# Patient Record
Sex: Female | Born: 1950
Health system: Southern US, Community
[De-identification: ages and names within clinical notes are randomized; demographics above are authoritative.]

## PROBLEM LIST (undated history)

## (undated) DIAGNOSIS — M8580 Other specified disorders of bone density and structure, unspecified site: Secondary | ICD-10-CM

## (undated) DIAGNOSIS — C801 Malignant (primary) neoplasm, unspecified: Secondary | ICD-10-CM

## (undated) DIAGNOSIS — J302 Other seasonal allergic rhinitis: Secondary | ICD-10-CM

## (undated) DIAGNOSIS — Z9889 Other specified postprocedural states: Secondary | ICD-10-CM

## (undated) DIAGNOSIS — E039 Hypothyroidism, unspecified: Secondary | ICD-10-CM

## (undated) DIAGNOSIS — B019 Varicella without complication: Secondary | ICD-10-CM

## (undated) HISTORY — DX: Hypothyroidism, unspecified: E03.9

## (undated) HISTORY — DX: Other specified disorders of bone density and structure, unspecified site: M85.80

## (undated) HISTORY — DX: Other seasonal allergic rhinitis: J30.2

## (undated) HISTORY — DX: Other specified postprocedural states: Z98.890

## (undated) HISTORY — PX: BREAST BIOPSY: SHX20

## (undated) HISTORY — DX: Varicella without complication: B01.9

## (undated) HISTORY — DX: Malignant (primary) neoplasm, unspecified: C80.1

---

## 1994-09-27 DIAGNOSIS — C801 Malignant (primary) neoplasm, unspecified: Secondary | ICD-10-CM

## 1994-09-27 HISTORY — DX: Malignant (primary) neoplasm, unspecified: C80.1

## 1996-09-27 HISTORY — PX: OTHER SURGICAL HISTORY: SHX169

## 2015-11-14 ENCOUNTER — Encounter: Payer: Self-pay | Admitting: Adult Health

## 2015-11-14 ENCOUNTER — Ambulatory Visit (INDEPENDENT_AMBULATORY_CARE_PROVIDER_SITE_OTHER): Payer: 59 | Admitting: Adult Health

## 2015-11-14 VITALS — BP 124/72 | HR 95 | Temp 97.2°F | Ht 66.0 in | Wt 136.6 lb

## 2015-11-14 DIAGNOSIS — Z7189 Other specified counseling: Secondary | ICD-10-CM | POA: Diagnosis not present

## 2015-11-14 DIAGNOSIS — E039 Hypothyroidism, unspecified: Secondary | ICD-10-CM | POA: Diagnosis not present

## 2015-11-14 DIAGNOSIS — Z7689 Persons encountering health services in other specified circumstances: Secondary | ICD-10-CM

## 2015-11-14 MED ORDER — LEVOTHYROXINE SODIUM 50 MCG PO TABS: 50.0000 ug | ORAL_TABLET | Freq: Every day | ORAL | Status: DC

## 2015-11-14 NOTE — Patient Instructions (Signed)
It was great meeting you today.  Please follow up with you for your physical.   If you need anything in the meantime, please let me know.

## 2015-11-14 NOTE — Progress Notes (Signed)
Patient presents to clinic today to establish care. She is a very pleasant caucasian female who moved from [place] about one year ago. She  has a past medical history of Cancer ([place]) (1996); Hypothyroidism; Seasonal allergies; Chicken pox; and H/O repair of rotator cuff.   Acute Concerns:  Establish Care  Chronic Issues:  Hypothyroidism  - She has been taking 33mcg of Synthroid for " many years". Denies any complications from the medication.   Health Maintenance: Dental --Goes yearly  Vision -- Goes yearly  Immunizations -- UTD  Colonoscopy -- Dec 2014 - 10 year plan  Mammogram -- 02/2015  Bone Density -- Unknown when the last Exercise: Walks Diet: Does not follow a certain diet.   Past Medical History  Diagnosis Date  . Cancer ([place]) 1996    Melanoma    Past Surgical History  Procedure Laterality Date  . Breast biopsy Right 1984, 1996    Benign    No current outpatient prescriptions on file prior to visit.   No current facility-administered medications on file prior to visit.    No Known Allergies  Family History  Problem Relation Age of Onset  . Cancer Mother   . Heart disease Father   . Heart disease Maternal Grandmother   . Stroke Paternal Grandfather     Social History   Social History  . Marital Status: Married    Spouse Name: N/A  . Number of Children: N/A  . Years of Education: N/A   Occupational History  . Not on file.   Social History Main Topics  . Smoking status: Never Smoker   . Smokeless tobacco: Not on file  . Alcohol Use: 0.0 oz/week    0 Standard drinks or equivalent per week  . Drug Use: Yes  . Sexual Activity: Not on file   Other Topics Concern  . Not on file   Social History Narrative  . No narrative on file    Review of Systems  Constitutional: Negative.   HENT: Negative.   Eyes: Negative.   Respiratory: Negative.   Cardiovascular: Negative.   Gastrointestinal: Negative.   Genitourinary: Negative.     Musculoskeletal: Negative.   Skin: Negative.   Neurological: Negative.   Endo/Heme/Allergies: Negative.   Psychiatric/Behavioral: Negative.   All other systems reviewed and are negative.   BP 124/72 mmHg  Pulse 95  Temp(Src) 97.2 F (36.2 C) (Oral)  Ht 5\' 6"  (1.676 m)  Wt 136 lb 9.6 oz (61.961 kg)  BMI 22.06 kg/m2  Physical Exam  Constitutional: She is oriented to person, place, and time and well-developed, well-nourished, and in no distress. No distress.  HENT:  Head: Normocephalic and atraumatic.  Right Ear: External ear normal.  Left Ear: External ear normal.  Nose: Nose normal.  Mouth/Throat: Oropharynx is clear and moist. No oropharyngeal exudate.  Eyes: Conjunctivae and EOM are normal. Pupils are equal, round, and reactive to light. Right eye exhibits no discharge. Left eye exhibits no discharge. No scleral icterus.  Neck: Normal range of motion. Neck supple. No thyromegaly present.  Cardiovascular: Normal rate, regular rhythm, normal heart sounds and intact distal pulses.  Exam reveals no gallop and no friction rub.   No murmur heard. Pulmonary/Chest: Effort normal and breath sounds normal. No respiratory distress. She has no wheezes. She has no rales. She exhibits no tenderness.  Lymphadenopathy:    She has no cervical adenopathy.  Neurological: She is alert and oriented to person, place, and time. She exhibits normal muscle  tone. Gait normal. Coordination normal. GCS score is 15.  Skin: Skin is warm and dry. No rash noted. She is not diaphoretic. No erythema. No pallor.  Psychiatric: Mood, memory, affect and judgment normal.  Nursing note and vitals reviewed.  Assessment/Plan: 1. Encounter to establish care - follow up for CPE - Follow up sooner if needed - Stressed the importance of diet and exercise.   2. Hypothyroidism, unspecified hypothyroidism type - levothyroxine (SYNTHROID) 50 MCG tablet; Take 1 tablet (50 mcg total) by mouth daily before breakfast.   Dispense: 90 tablet; Refill: 3

## 2015-11-18 MED FILL — LEVOTHYROXINE 50 MCG TABLET: 50 | 90 days supply | Qty: 90 | Fill #0

## 2015-11-20 ENCOUNTER — Encounter: Payer: Self-pay | Admitting: Adult Health

## 2016-02-09 ENCOUNTER — Encounter: Payer: 59 | Admitting: Adult Health

## 2016-02-11 ENCOUNTER — Encounter: Payer: 59 | Admitting: Adult Health

## 2016-02-12 ENCOUNTER — Encounter: Payer: Self-pay | Admitting: Adult Health

## 2016-02-12 ENCOUNTER — Ambulatory Visit (INDEPENDENT_AMBULATORY_CARE_PROVIDER_SITE_OTHER): Payer: 59 | Admitting: Adult Health

## 2016-02-12 VITALS — BP 118/70 | Temp 98.1°F | Ht 66.0 in | Wt 132.5 lb

## 2016-02-12 DIAGNOSIS — Z780 Asymptomatic menopausal state: Secondary | ICD-10-CM | POA: Diagnosis not present

## 2016-02-12 DIAGNOSIS — E039 Hypothyroidism, unspecified: Secondary | ICD-10-CM | POA: Diagnosis not present

## 2016-02-12 DIAGNOSIS — Z0000 Encounter for general adult medical examination without abnormal findings: Secondary | ICD-10-CM

## 2016-02-12 LAB — LIPID PANEL
CHOLESTEROL: 178 mg/dL (ref 0–200)
HDL: 81.3 mg/dL (ref >39.00)
LDL CALC: 82 mg/dL (ref 0–99)
NonHDL: 96.52
Total CHOL/HDL Ratio: 2
Triglycerides: 71 mg/dL (ref 0.0–149.0)
VLDL: 14.2 mg/dL (ref 0.0–40.0)

## 2016-02-12 LAB — CBC
HEMATOCRIT: 38.2 % (ref 36.0–46.0)
Hemoglobin: 12.9 g/dL (ref 12.0–15.0)
MCHC: 33.7 g/dL (ref 30.0–36.0)
MCV: 92.7 fl (ref 78.0–100.0)
Platelets: 407 10*3/uL — ABNORMAL HIGH (ref 150.0–400.0)
RBC: 4.12 Mil/uL (ref 3.87–5.11)
RDW: 12.9 % (ref 11.5–15.5)
WBC: 4.5 10*3/uL (ref 4.0–10.5)

## 2016-02-12 LAB — BASIC METABOLIC PANEL
BUN: 11 mg/dL (ref 6–23)
CALCIUM: 9.5 mg/dL (ref 8.4–10.5)
CHLORIDE: 104 meq/L (ref 96–112)
CO2: 30 mEq/L (ref 19–32)
CREATININE: 0.97 mg/dL (ref 0.40–1.20)
GFR: 61.56 mL/min (ref >60.00)
Glucose, Bld: 88 mg/dL (ref 70–99)
Potassium: 4.4 mEq/L (ref 3.5–5.1)
Sodium: 140 mEq/L (ref 135–145)

## 2016-02-12 LAB — HEPATIC FUNCTION PANEL
ALBUMIN: 4.1 g/dL (ref 3.5–5.2)
ALK PHOS: 42 U/L (ref 39–117)
ALT: 14 U/L (ref 0–35)
AST: 20 U/L (ref 0–37)
BILIRUBIN TOTAL: 0.6 mg/dL (ref 0.2–1.2)
Bilirubin, Direct: 0.1 mg/dL (ref 0.0–0.3)
Total Protein: 6 g/dL (ref 6.0–8.3)

## 2016-02-12 LAB — POC URINALSYSI DIPSTICK (AUTOMATED)
Blood, UA: NEGATIVE
Glucose, UA: NEGATIVE
Leukocytes, UA: NEGATIVE
Nitrite, UA: NEGATIVE
PH UA: 6
SPEC GRAV UA: 1.025
Urobilinogen, UA: 0.2

## 2016-02-12 LAB — TSH: TSH: 1.5 u[IU]/mL (ref 0.35–4.50)

## 2016-02-12 LAB — HEMOGLOBIN A1C: HEMOGLOBIN A1C: 5.7 % (ref 4.6–6.5)

## 2016-02-12 NOTE — Addendum Note (Signed)
Addended by: Sandria Bales B on: 02/12/2016 11:38 AM   Modules accepted: Orders

## 2016-02-12 NOTE — Progress Notes (Signed)
Subjective:    Patient ID: Natasha Norman, female    DOB: [birth year], 65 y.o.   MRN: [id number]  HPI  Patient presents for yearly preventative medicine examination.  All immunizations and health maintenance protocols were reviewed with the patient and needed orders were placed  Medication reconciliation,  past medical history, social history, problem list and allergies were reviewed in detail with the patient  Goals were established with regard to weight loss, exercise, and  diet in compliance with medications  End of life planning was discussed - she does not have a living will or advanced directive  She is up to date on health maintenance such as eye exam, dental visit, pelvic, and colonoscopy. She will have a mammogram and bone density in June.   Review of Systems  Constitutional: Negative.   HENT: Negative.   Eyes: Negative.   Respiratory: Negative.   Cardiovascular: Negative.   Gastrointestinal: Negative.   Endocrine: Negative.   Genitourinary: Negative.   Musculoskeletal: Negative.   Allergic/Immunologic: Negative.   Neurological: Negative.   Hematological: Negative.   Psychiatric/Behavioral: Negative.    Past Medical History  Diagnosis Date  . Cancer ([place]) 1996    Melanoma  . Hypothyroidism   . Seasonal allergies   . Chicken pox   . H/O repair of rotator cuff     Right   . Osteopenia     Social History   Social History  . Marital Status: Married    Spouse Name: N/A  . Number of Children: N/A  . Years of Education: N/A   Occupational History  . Not on file.   Social History Main Topics  . Smoking status: Never Smoker   . Smokeless tobacco: Not on file  . Alcohol Use: 0.0 oz/week    0 Standard drinks or equivalent per week     Comment: Wine  . Drug Use: No  . Sexual Activity: Not on file   Other Topics Concern  . Not on file   Social History Narrative   Web designer    Married   Moved from [place]     Past Surgical  History  Procedure Laterality Date  . Breast biopsy Right 1984, 1996    Benign  . Lymph node resection  1998    Family History  Problem Relation Age of Onset  . Cancer Mother     breast cancer  . Heart disease Father   . Heart failure Maternal Grandmother   . Stroke Paternal Grandfather     No Known Allergies  Current Outpatient Prescriptions on File Prior to Visit  Medication Sig Dispense Refill  . levothyroxine (SYNTHROID) 50 MCG tablet Take 1 tablet (50 mcg total) by mouth daily before breakfast. 90 tablet 3  . triamcinolone cream (KENALOG) 0.1 % Apply 1 application topically as needed.     No current facility-administered medications on file prior to visit.    BP 118/70 mmHg  Temp(Src) 98.1 F (36.7 C) (Oral)  Ht 5\' 6"  (1.676 m)  Wt 132 lb 8 oz (60.102 kg)  BMI 21.40 kg/m2       Objective:   Physical Exam  Constitutional: She is oriented to person, place, and time. She appears well-developed and well-nourished. No distress.  HENT:  Head: Normocephalic and atraumatic.  Right Ear: External ear normal.  Left Ear: External ear normal.  Nose: Nose normal.  Mouth/Throat: Oropharynx is clear and moist. No oropharyngeal exudate.  Eyes: Conjunctivae and EOM are normal. Pupils are equal, round,  and reactive to light. Right eye exhibits no discharge. Left eye exhibits no discharge. No scleral icterus.  Neck: Normal range of motion. Neck supple. No JVD present. No tracheal deviation present. No thyromegaly present.  Cardiovascular: Normal rate, regular rhythm, normal heart sounds and intact distal pulses.  Exam reveals no gallop and no friction rub.   No murmur heard. Pulmonary/Chest: Effort normal and breath sounds normal. No stridor. No respiratory distress. She has no wheezes. She has no rales. She exhibits no tenderness.  Abdominal: Soft. Bowel sounds are normal. She exhibits no distension and no mass. There is no tenderness. There is no rebound and no guarding.    Genitourinary:  Breast exam: No lumps, masses, discharge, dimpling, asymmetry.   Musculoskeletal: Normal range of motion.  Lymphadenopathy:    She has no cervical adenopathy.  Neurological: She is alert and oriented to person, place, and time. She has normal reflexes. She displays normal reflexes. No cranial nerve deficit. Coordination normal.  Skin: Skin is warm and dry. No rash noted. No erythema. No pallor.  Psychiatric: She has a normal mood and affect. Her behavior is normal. Judgment and thought content normal.  Nursing note and vitals reviewed.      Assessment & Plan:  1. Routine general medical examination at a health care facility  - POCT Urinalysis Dipstick (Automated) - Basic metabolic panel - CBC - Hemoglobin A1c - Hepatic function panel - Lipid panel - TSH - MM DIGITAL SCREENING BILATERAL; Future - DG Bone Density; Future - Continue to exercise, eat healthy and stay active - Advanced directive and living will given   2. Hypothyroidism, unspecified hypothyroidism type - TSH - Consider changing synthroid dose 3. Post-menopausal  - MM DIGITAL SCREENING BILATERAL; Future - DG Bone Density; Future  Dorothyann Peng, NP

## 2016-02-12 NOTE — Patient Instructions (Signed)
It was great seeing you again today!  I will follow up with you regarding your labs.   Continue to stay active   Schedule your bone density and mammogram   Follow up with me in one year

## 2016-02-18 MED FILL — LEVOTHYROXINE 50 MCG TABLET: 50 | 90 days supply | Qty: 90 | Fill #1

## 2016-04-15 DIAGNOSIS — L814 Other melanin hyperpigmentation: Secondary | ICD-10-CM | POA: Diagnosis not present

## 2016-04-15 DIAGNOSIS — L821 Other seborrheic keratosis: Secondary | ICD-10-CM | POA: Diagnosis not present

## 2016-04-15 DIAGNOSIS — Z85820 Personal history of malignant melanoma of skin: Secondary | ICD-10-CM | POA: Diagnosis not present

## 2016-04-15 DIAGNOSIS — D1801 Hemangioma of skin and subcutaneous tissue: Secondary | ICD-10-CM | POA: Diagnosis not present

## 2016-04-16 ENCOUNTER — Other Ambulatory Visit: Payer: Self-pay | Admitting: Adult Health

## 2016-04-16 DIAGNOSIS — E2839 Other primary ovarian failure: Secondary | ICD-10-CM

## 2016-04-16 DIAGNOSIS — Z1231 Encounter for screening mammogram for malignant neoplasm of breast: Secondary | ICD-10-CM

## 2016-04-23 ENCOUNTER — Ambulatory Visit
Admission: RE | Admit: 2016-04-23 | Discharge: 2016-04-23 | Disposition: A | Discharge status: Home / Self Care | Payer: 59 | Source: Ambulatory Visit | Attending: Adult Health | Admitting: Adult Health

## 2016-04-23 DIAGNOSIS — Z780 Asymptomatic menopausal state: Secondary | ICD-10-CM | POA: Diagnosis not present

## 2016-04-23 DIAGNOSIS — E2839 Other primary ovarian failure: Secondary | ICD-10-CM

## 2016-04-23 DIAGNOSIS — M8589 Other specified disorders of bone density and structure, multiple sites: Secondary | ICD-10-CM | POA: Diagnosis not present

## 2016-04-23 DIAGNOSIS — Z1231 Encounter for screening mammogram for malignant neoplasm of breast: Secondary | ICD-10-CM

## 2016-05-19 MED FILL — LEVOTHYROXINE 50 MCG TABLET: 50 | 90 days supply | Qty: 90 | Fill #2

## 2016-08-04 ENCOUNTER — Other Ambulatory Visit: Payer: Self-pay

## 2016-08-04 ENCOUNTER — Telehealth: Payer: Self-pay | Admitting: Adult Health

## 2016-08-04 MED ORDER — TRIAMCINOLONE ACETONIDE 0.1 % EX CREA: 1.0000 "application " | TOPICAL_CREAM | CUTANEOUS | 1 refills | Status: DC | PRN

## 2016-08-04 MED FILL — LEVOTHYROXINE 50 MCG TABLET: 50 | 90 days supply | Qty: 90 | Fill #3

## 2016-08-04 MED FILL — TRIAMCINOLONE 0.1% CREAM: 0.1 | 15 days supply | Qty: 30 | Fill #0

## 2016-08-04 NOTE — Telephone Encounter (Signed)
Pt needs new rx triamcinolone 0.1 % cream send to [facility]

## 2016-08-04 NOTE — Telephone Encounter (Signed)
OK to refill

## 2016-08-04 NOTE — Telephone Encounter (Signed)
Ok to refill 

## 2016-08-04 NOTE — Telephone Encounter (Signed)
Rx has been sent in. 

## 2016-08-24 DIAGNOSIS — H5213 Myopia, bilateral: Secondary | ICD-10-CM | POA: Diagnosis not present

## 2016-11-17 ENCOUNTER — Other Ambulatory Visit: Payer: Self-pay | Admitting: Adult Health

## 2016-11-17 DIAGNOSIS — E039 Hypothyroidism, unspecified: Secondary | ICD-10-CM

## 2016-11-17 MED FILL — LEVOTHYROXINE 50 MCG TABLET: 50 | 90 days supply | Qty: 90 | Fill #0

## 2016-11-17 NOTE — Telephone Encounter (Signed)
Ok to refill for one year  

## 2016-11-17 NOTE — Telephone Encounter (Signed)
Pt has cpe scheduled 02/09/17 - ok to refill?

## 2017-02-02 ENCOUNTER — Other Ambulatory Visit (HOSPITAL_COMMUNITY)
Admission: RE | Admit: 2017-02-02 | Discharge: 2017-02-02 | Disposition: A | Discharge status: Home / Self Care | Payer: 59 | Source: Ambulatory Visit | Attending: Adult Health | Admitting: Adult Health

## 2017-02-02 ENCOUNTER — Ambulatory Visit (INDEPENDENT_AMBULATORY_CARE_PROVIDER_SITE_OTHER): Payer: 59 | Admitting: Adult Health

## 2017-02-02 ENCOUNTER — Encounter: Payer: Self-pay | Admitting: Adult Health

## 2017-02-02 VITALS — BP 118/62 | Temp 97.9°F | Ht 66.0 in | Wt 133.7 lb

## 2017-02-02 DIAGNOSIS — Z85820 Personal history of malignant melanoma of skin: Secondary | ICD-10-CM | POA: Insufficient documentation

## 2017-02-02 DIAGNOSIS — E039 Hypothyroidism, unspecified: Secondary | ICD-10-CM

## 2017-02-02 DIAGNOSIS — Z0000 Encounter for general adult medical examination without abnormal findings: Secondary | ICD-10-CM

## 2017-02-02 DIAGNOSIS — E559 Vitamin D deficiency, unspecified: Secondary | ICD-10-CM | POA: Insufficient documentation

## 2017-02-02 DIAGNOSIS — Z1159 Encounter for screening for other viral diseases: Secondary | ICD-10-CM | POA: Diagnosis not present

## 2017-02-02 DIAGNOSIS — M8580 Other specified disorders of bone density and structure, unspecified site: Secondary | ICD-10-CM | POA: Diagnosis not present

## 2017-02-02 DIAGNOSIS — J302 Other seasonal allergic rhinitis: Secondary | ICD-10-CM | POA: Insufficient documentation

## 2017-02-02 LAB — HEPATIC FUNCTION PANEL
ALT: 16 U/L (ref 0–35)
AST: 23 U/L (ref 0–37)
Albumin: 4.7 g/dL (ref 3.5–5.2)
Alkaline Phosphatase: 51 U/L (ref 39–117)
BILIRUBIN DIRECT: 0.1 mg/dL (ref 0.0–0.3)
BILIRUBIN TOTAL: 0.6 mg/dL (ref 0.2–1.2)
TOTAL PROTEIN: 7 g/dL (ref 6.0–8.3)

## 2017-02-02 LAB — CBC WITH DIFFERENTIAL/PLATELET
BASOS PCT: 1.3 % (ref 0.0–3.0)
Basophils Absolute: 0.1 10*3/uL (ref 0.0–0.1)
EOS ABS: 0.2 10*3/uL (ref 0.0–0.7)
EOS PCT: 3.5 % (ref 0.0–5.0)
HEMATOCRIT: 41.4 % (ref 36.0–46.0)
HEMOGLOBIN: 13.9 g/dL (ref 12.0–15.0)
Lymphocytes Relative: 33.6 % (ref 12.0–46.0)
Lymphs Abs: 1.8 10*3/uL (ref 0.7–4.0)
MCHC: 33.7 g/dL (ref 30.0–36.0)
MCV: 93.4 fl (ref 78.0–100.0)
MONO ABS: 0.5 10*3/uL (ref 0.1–1.0)
Monocytes Relative: 9.6 % (ref 3.0–12.0)
NEUTROS ABS: 2.7 10*3/uL (ref 1.4–7.7)
Neutrophils Relative %: 52 % (ref 43.0–77.0)
PLATELETS: 422 10*3/uL — AB (ref 150.0–400.0)
RBC: 4.43 Mil/uL (ref 3.87–5.11)
RDW: 13.2 % (ref 11.5–15.5)
WBC: 5.3 10*3/uL (ref 4.0–10.5)

## 2017-02-02 LAB — LIPID PANEL
CHOLESTEROL: 202 mg/dL — AB (ref 0–200)
HDL: 103.3 mg/dL (ref >39.00)
LDL Cholesterol: 83 mg/dL (ref 0–99)
NonHDL: 98.38
TRIGLYCERIDES: 78 mg/dL (ref 0.0–149.0)
Total CHOL/HDL Ratio: 2
VLDL: 15.6 mg/dL (ref 0.0–40.0)

## 2017-02-02 LAB — VITAMIN D 25 HYDROXY (VIT D DEFICIENCY, FRACTURES): VITD: 62.54 ng/mL (ref 30.00–100.00)

## 2017-02-02 LAB — BASIC METABOLIC PANEL
BUN: 15 mg/dL (ref 6–23)
CHLORIDE: 103 meq/L (ref 96–112)
CO2: 28 meq/L (ref 19–32)
CREATININE: 1.03 mg/dL (ref 0.40–1.20)
Calcium: 9.7 mg/dL (ref 8.4–10.5)
GFR: 57.26 mL/min — ABNORMAL LOW (ref >60.00)
Glucose, Bld: 92 mg/dL (ref 70–99)
POTASSIUM: 4.1 meq/L (ref 3.5–5.1)
Sodium: 139 mEq/L (ref 135–145)

## 2017-02-02 LAB — TSH: TSH: 1.59 u[IU]/mL (ref 0.35–4.50)

## 2017-02-02 MED ORDER — LEVOTHYROXINE SODIUM 50 MCG PO TABS: 50.0000 ug | ORAL_TABLET | Freq: Every day | ORAL | 3 refills | Status: DC

## 2017-02-02 MED FILL — LEVOTHYROXINE 50 MCG TABLET: 50 | 90 days supply | Qty: 90 | Fill #0

## 2017-02-02 NOTE — Patient Instructions (Addendum)
It was great seeing you today   I will release your labs to mychart once they are back   Please follow up with me in one year or sooner if needed  Health Maintenance, Female Adopting a healthy lifestyle and getting preventive care can go a long way to promote health and wellness. Talk with your health care provider about what schedule of regular examinations is right for you. This is a good chance for you to check in with your provider about disease prevention and staying healthy. In between checkups, there are plenty of things you can do on your own. Experts have done a lot of research about which lifestyle changes and preventive measures are most likely to keep you healthy. Ask your health care provider for more information. Weight and diet Eat a healthy diet  Be sure to include plenty of vegetables, fruits, low-fat dairy products, and lean protein.  Do not eat a lot of foods high in solid fats, added sugars, or salt.  Get regular exercise. This is one of the most important things you can do for your health.  Most adults should exercise for at least 150 minutes each week. The exercise should increase your heart rate and make you sweat (moderate-intensity exercise).  Most adults should also do strengthening exercises at least twice a week. This is in addition to the moderate-intensity exercise. Maintain a healthy weight  Body mass index (BMI) is a measurement that can be used to identify possible weight problems. It estimates body fat based on height and weight. Your health care provider can help determine your BMI and help you achieve or maintain a healthy weight.  For females 30 years of age and older:  A BMI below 18.5 is considered underweight.  A BMI of 18.5 to 24.9 is normal.  A BMI of 25 to 29.9 is considered overweight.  A BMI of 30 and above is considered obese. Watch levels of cholesterol and blood lipids  You should start having your blood tested for lipids and  cholesterol at 65 years of age, then have this test every 5 years.  You may need to have your cholesterol levels checked more often if:  Your lipid or cholesterol levels are high.  You are older than 65 years of age.  You are at high risk for heart disease. Cancer screening Lung Cancer  Lung cancer screening is recommended for adults 37-41 years old who are at high risk for lung cancer because of a history of smoking.  A yearly low-dose CT scan of the lungs is recommended for people who:  Currently smoke.  Have quit within the past 15 years.  Have at least a 30-pack-year history of smoking. A pack year is smoking an average of one pack of cigarettes a day for 1 year.  Yearly screening should continue until it has been 15 years since you quit.  Yearly screening should stop if you develop a health problem that would prevent you from having lung cancer treatment. Breast Cancer  Practice breast self-awareness. This means understanding how your breasts normally appear and feel.  It also means doing regular breast self-exams. Let your health care provider know about any changes, no matter how small.  If you are in your 20s or 30s, you should have a clinical breast exam (CBE) by a health care provider every 1-3 years as part of a regular health exam.  If you are 18 or older, have a CBE every year. Also consider having a breast  X-ray (mammogram) every year.  If you have a family history of breast cancer, talk to your health care provider about genetic screening.  If you are at high risk for breast cancer, talk to your health care provider about having an MRI and a mammogram every year.  Breast cancer gene (BRCA) assessment is recommended for women who have family members with BRCA-related cancers. BRCA-related cancers include:  Breast.  Ovarian.  Tubal.  Peritoneal cancers.  Results of the assessment will determine the need for genetic counseling and BRCA1 and BRCA2  testing. Cervical Cancer  Your health care provider may recommend that you be screened regularly for cancer of the pelvic organs (ovaries, uterus, and vagina). This screening involves a pelvic examination, including checking for microscopic changes to the surface of your cervix (Pap test). You may be encouraged to have this screening done every 3 years, beginning at age 64.  For women ages 42-65, health care providers may recommend pelvic exams and Pap testing every 3 years, or they may recommend the Pap and pelvic exam, combined with testing for human papilloma virus (HPV), every 5 years. Some types of HPV increase your risk of cervical cancer. Testing for HPV may also be done on women of any age with unclear Pap test results.  Other health care providers may not recommend any screening for nonpregnant women who are considered low risk for pelvic cancer and who do not have symptoms. Ask your health care provider if a screening pelvic exam is right for you.  If you have had past treatment for cervical cancer or a condition that could lead to cancer, you need Pap tests and screening for cancer for at least 20 years after your treatment. If Pap tests have been discontinued, your risk factors (such as having a new sexual partner) need to be reassessed to determine if screening should resume. Some women have medical problems that increase the chance of getting cervical cancer. In these cases, your health care provider may recommend more frequent screening and Pap tests. Colorectal Cancer  This type of cancer can be detected and often prevented.  Routine colorectal cancer screening usually begins at 65 years of age and continues through 65 years of age.  Your health care provider may recommend screening at an earlier age if you have risk factors for colon cancer.  Your health care provider may also recommend using home test kits to check for hidden blood in the stool.  A small camera at the end of a  tube can be used to examine your colon directly (sigmoidoscopy or colonoscopy). This is done to check for the earliest forms of colorectal cancer.  Routine screening usually begins at age 59.  Direct examination of the colon should be repeated every 5-10 years through 65 years of age. However, you may need to be screened more often if early forms of precancerous polyps or small growths are found. Skin Cancer  Check your skin from head to toe regularly.  Tell your health care provider about any new moles or changes in moles, especially if there is a change in a mole's shape or color.  Also tell your health care provider if you have a mole that is larger than the size of a pencil eraser.  Always use sunscreen. Apply sunscreen liberally and repeatedly throughout the day.  Protect yourself by wearing long sleeves, pants, a wide-brimmed hat, and sunglasses whenever you are outside. Heart disease, diabetes, and high blood pressure  High blood pressure causes heart  disease and increases the risk of stroke. High blood pressure is more likely to develop in:  People who have blood pressure in the high end of the normal range (130-139/85-89 mm Hg).  People who are overweight or obese.  People who are African American.  If you are 70-59 years of age, have your blood pressure checked every 3-5 years. If you are 26 years of age or older, have your blood pressure checked every year. You should have your blood pressure measured twice-once when you are at a hospital or clinic, and once when you are not at a hospital or clinic. Record the average of the two measurements. To check your blood pressure when you are not at a hospital or clinic, you can use:  An automated blood pressure machine at a pharmacy.  A home blood pressure monitor.  If you are between 87 years and 21 years old, ask your health care provider if you should take aspirin to prevent strokes.  Have regular diabetes screenings. This  involves taking a blood sample to check your fasting blood sugar level.  If you are at a normal weight and have a low risk for diabetes, have this test once every three years after 65 years of age.  If you are overweight and have a high risk for diabetes, consider being tested at a younger age or more often. Preventing infection Hepatitis B  If you have a higher risk for hepatitis B, you should be screened for this virus. You are considered at high risk for hepatitis B if:  You were born in a country where hepatitis B is common. Ask your health care provider which countries are considered high risk.  Your parents were born in a high-risk country, and you have not been immunized against hepatitis B (hepatitis B vaccine).  You have HIV or AIDS.  You use needles to inject street drugs.  You live with someone who has hepatitis B.  You have had sex with someone who has hepatitis B.  You get hemodialysis treatment.  You take certain medicines for conditions, including cancer, organ transplantation, and autoimmune conditions. Hepatitis C  Blood testing is recommended for:  Everyone born from [birth year] through 1964.  Anyone with known risk factors for hepatitis C. Sexually transmitted infections (STIs)  You should be screened for sexually transmitted infections (STIs) including gonorrhea and chlamydia if:  You are sexually active and are younger than 65 years of age.  You are older than 65 years of age and your health care provider tells you that you are at risk for this type of infection.  Your sexual activity has changed since you were last screened and you are at an increased risk for chlamydia or gonorrhea. Ask your health care provider if you are at risk.  If you do not have HIV, but are at risk, it may be recommended that you take a prescription medicine daily to prevent HIV infection. This is called pre-exposure prophylaxis (PrEP). You are considered at risk if:  You are  sexually active and do not regularly use condoms or know the HIV status of your partner(s).  You take drugs by injection.  You are sexually active with a partner who has HIV. Talk with your health care provider about whether you are at high risk of being infected with HIV. If you choose to begin PrEP, you should first be tested for HIV. You should then be tested every 3 months for as long as you are taking PrEP.  Pregnancy  If you are premenopausal and you may become pregnant, ask your health care provider about preconception counseling.  If you may become pregnant, take 400 to 800 micrograms (mcg) of folic acid every day.  If you want to prevent pregnancy, talk to your health care provider about birth control (contraception). Osteoporosis and menopause  Osteoporosis is a disease in which the bones lose minerals and strength with aging. This can result in serious bone fractures. Your risk for osteoporosis can be identified using a bone density scan.  If you are 51 years of age or older, or if you are at risk for osteoporosis and fractures, ask your health care provider if you should be screened.  Ask your health care provider whether you should take a calcium or vitamin D supplement to lower your risk for osteoporosis.  Menopause may have certain physical symptoms and risks.  Hormone replacement therapy may reduce some of these symptoms and risks. Talk to your health care provider about whether hormone replacement therapy is right for you. Follow these instructions at home:  Schedule regular health, dental, and eye exams.  Stay current with your immunizations.  Do not use any tobacco products including cigarettes, chewing tobacco, or electronic cigarettes.  If you are pregnant, do not drink alcohol.  If you are breastfeeding, limit how much and how often you drink alcohol.  Limit alcohol intake to no more than 1 drink per day for nonpregnant women. One drink equals 12 ounces of  beer, 5 ounces of wine, or 1 ounces of hard liquor.  Do not use street drugs.  Do not share needles.  Ask your health care provider for help if you need support or information about quitting drugs.  Tell your health care provider if you often feel depressed.  Tell your health care provider if you have ever been abused or do not feel safe at home. This information is not intended to replace advice given to you by your health care provider. Make sure you discuss any questions you have with your health care provider. Document Released: 03/29/2011 Document Revised: 02/19/2016 Document Reviewed: 06/17/2015 Elsevier Interactive Patient Education  2017 [employer].

## 2017-02-02 NOTE — Progress Notes (Signed)
Subjective:    Patient ID: Natasha Norman, female    DOB: [birth year], 66 y.o.   MRN: [id number]  HPI  Patient presents for yearly preventative medicine examination. She is a pleasant 66 year old female who  has a past medical history of Cancer ([place]) (1996); Chicken pox; H/O repair of rotator cuff; Hypothyroidism; Osteopenia; and Seasonal allergies.  All immunizations and health maintenance protocols were reviewed with the patient and needed orders were placed.  Appropriate screening laboratory values were ordered for the patient including screening of hyperlipidemia, renal function and hepatic function.  Medication reconciliation,  past medical history, social history, problem list and allergies were reviewed in detail with the patient  Goals were established with regard to weight loss, exercise, and  diet in compliance with medications. She eats healthy and is active   End of life planning was discussed. She has an advanced directive and living will. This was scanned into the chart today.   She is up to date on health maintenance such as eye exam, dental visit, bone density, mammogram and colonoscopy.   She denies any interval history   Review of Systems  Constitutional: Negative.   HENT: Negative.   Respiratory: Negative.   Cardiovascular: Negative.   Gastrointestinal: Negative.   Endocrine: Negative.   Genitourinary: Negative.   Musculoskeletal: Negative.   Skin: Negative.   Allergic/Immunologic: Negative.   Neurological: Negative.   Hematological: Negative.   Psychiatric/Behavioral: Negative.   All other systems reviewed and are negative.  Past Medical History:  Diagnosis Date  . Cancer ([place]) 1996   Melanoma  . Chicken pox   . H/O repair of rotator cuff    Right   . Hypothyroidism   . Osteopenia   . Seasonal allergies     Social History   Social History  . Marital status: Married    Spouse name: N/A  . Number of children: N/A  . Years of education:  N/A   Occupational History  . Not on file.   Social History Main Topics  . Smoking status: Never Smoker  . Smokeless tobacco: Not on file  . Alcohol use 0.0 oz/week     Comment: Wine  . Drug use: No  . Sexual activity: Not on file   Other Topics Concern  . 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 BREAST BIOPSY Right 1984, 1996   Benign  . lymph node resection  1998    Family History  Problem Relation Age of Onset  . Cancer Mother     breast cancer  . Heart disease Father   . Heart failure Maternal Grandmother   . Stroke Paternal Grandfather     No Known Allergies  Current Outpatient Prescriptions on File Prior to Visit  Medication Sig Dispense Refill  . levothyroxine (SYNTHROID, LEVOTHROID) 50 MCG tablet TAKE 1 TABLET BY MOUTH EVERY DAY BEFORE BREAKFAST 90 tablet 3  . triamcinolone cream (KENALOG) 0.1 % Apply 1 application topically as needed. 30 g 1   No current facility-administered medications on file prior to visit.     There were no vitals taken for this visit.      Objective:   Physical Exam  Constitutional: She is oriented to person, place, and time. She appears well-developed and well-nourished. No distress.  HENT:  Head: Normocephalic and atraumatic.  Right Ear:  External ear normal.  Left Ear: External ear normal.  Nose: Nose normal.  Mouth/Throat: Oropharynx is clear and moist. No oropharyngeal exudate.  Eyes: Conjunctivae are normal. Pupils are equal, round, and reactive to light. Right eye exhibits no discharge. Left eye exhibits no discharge. No scleral icterus.  Neck: Normal range of motion. Neck supple. No JVD present. No tracheal deviation present. No thyroid mass and no thyromegaly present.  Cardiovascular: Normal rate, regular rhythm, normal heart sounds and intact distal pulses.  Exam reveals no gallop and no friction  rub.   No murmur heard. Pulmonary/Chest: Effort normal and breath sounds normal. No stridor. No respiratory distress. She has no wheezes. She has no rales. She exhibits no tenderness.  Abdominal: Soft. Bowel sounds are normal. She exhibits no distension and no mass. There is no tenderness. There is no rebound and no guarding.  Genitourinary: Vagina normal. Cervix exhibits no motion tenderness, no discharge and no friability. No tenderness or bleeding in the vagina. No vaginal discharge found.  Genitourinary Comments: Breast Exam: No masses, lumps, dimpling, or discharge   Musculoskeletal: Normal range of motion. She exhibits no edema, tenderness or deformity.  Lymphadenopathy:    She has no cervical adenopathy.  Neurological: She is alert and oriented to person, place, and time. She has normal reflexes. She displays normal reflexes. No cranial nerve deficit. She exhibits normal muscle tone. Coordination normal.  Skin: Skin is warm and dry. No rash noted. No erythema. No pallor.  Psychiatric: She has a normal mood and affect. Her behavior is normal. Judgment and thought content normal.  Nursing note and vitals reviewed.     Assessment & Plan:  1. Routine general medical examination at a health care facility - Continue to stay active and eat healthy.  - Follow up in one year or sooner if needed - Adv directive and living will scanned into chart - Basic metabolic panel - CBC with Differential/Platelet - Hepatic function panel - Lipid panel - TSH - Vitamin D, 25-hydroxy - Hep C Antibody - PAP [[place]]  2. Hypothyroidism, unspecified type - Consider change in synthroid dose - Basic metabolic panel - CBC with Differential/Platelet - Hepatic function panel - Lipid panel - TSH - Vitamin D, 25-hydroxy - Hep C Antibody  3. Need for hepatitis C screening test  - Hep C Antibody  4. Vitamin D deficiency  - Vitamin D, 25-hydroxy  Dorothyann Peng, NP

## 2017-02-02 NOTE — Addendum Note (Signed)
Addended by: Apolinar Junes on: 02/02/2017 04:56 PM   Modules accepted: Orders

## 2017-02-03 LAB — HEPATITIS C ANTIBODY: HCV AB: NEGATIVE

## 2017-02-04 LAB — CYTOLOGY - PAP
Diagnosis: NEGATIVE
HPV: NOT DETECTED

## 2017-02-09 ENCOUNTER — Encounter: Payer: 59 | Admitting: Adult Health

## 2017-04-04 ENCOUNTER — Other Ambulatory Visit: Payer: Self-pay | Admitting: Adult Health

## 2017-04-04 DIAGNOSIS — Z1231 Encounter for screening mammogram for malignant neoplasm of breast: Secondary | ICD-10-CM

## 2017-04-19 DIAGNOSIS — L821 Other seborrheic keratosis: Secondary | ICD-10-CM | POA: Diagnosis not present

## 2017-04-19 DIAGNOSIS — L814 Other melanin hyperpigmentation: Secondary | ICD-10-CM | POA: Diagnosis not present

## 2017-04-19 DIAGNOSIS — D1801 Hemangioma of skin and subcutaneous tissue: Secondary | ICD-10-CM | POA: Diagnosis not present

## 2017-04-19 DIAGNOSIS — Z85820 Personal history of malignant melanoma of skin: Secondary | ICD-10-CM | POA: Diagnosis not present

## 2017-04-25 ENCOUNTER — Encounter: Payer: Self-pay | Admitting: Radiology

## 2017-04-25 ENCOUNTER — Ambulatory Visit
Admission: RE | Admit: 2017-04-25 | Discharge: 2017-04-25 | Disposition: A | Discharge status: Home / Self Care | Payer: 59 | Source: Ambulatory Visit | Attending: Adult Health | Admitting: Adult Health

## 2017-04-25 DIAGNOSIS — Z1231 Encounter for screening mammogram for malignant neoplasm of breast: Secondary | ICD-10-CM

## 2017-05-13 MED FILL — LEVOTHYROXINE 50 MCG TABLET: 50 | 90 days supply | Qty: 90 | Fill #1

## 2017-06-17 ENCOUNTER — Encounter: Payer: Self-pay | Admitting: Adult Health

## 2017-07-21 DIAGNOSIS — H5213 Myopia, bilateral: Secondary | ICD-10-CM | POA: Diagnosis not present

## 2017-07-21 DIAGNOSIS — H524 Presbyopia: Secondary | ICD-10-CM | POA: Diagnosis not present

## 2017-08-16 MED FILL — LEVOTHYROXINE 50 MCG TABLET: 50 | 90 days supply | Qty: 90 | Fill #2

## 2017-09-19 ENCOUNTER — Ambulatory Visit: Payer: Self-pay

## 2017-09-19 NOTE — Telephone Encounter (Signed)
Patient called in with having "cold symptoms x 5 days, since 09/14/17." Stated "it started on Thursday morning with nasal congestion and the cough started on Friday." She coughed while on the phone and it was a congested cough. When asked was she coughing up anything, she stated "no, not at all. It just sounds like this since Friday." She stated "I started taking Zycam for about 2 days, but it didn't seem to help.  I also have been taking Sudafed, Advil and Tylenol." When asked was she having body aches and fever, she stated "my head hurts some, but I haven't had any body aches. I took my temperature this morning and it was 100.2, yesterday it was 99.6. I haven't taken my temperature any other days." According to the protocol, patient is to manage her symptoms at home. Care advice given, patient reported having mucinex samples and asked would that be helpful to take. I advised to take the mucinex bid as indicated on the packaging for the congestion and hold off on taking the Dextromethorphan until the cough is a dry cough without the congestion. She was also advised to stop taking the Advil and Tylenol, unless she was having a fever or body aches, because they could mask the actual temperature reading, since her temperature was 100.2 this morning. She was advised to take her temperature several times throughout the day and if it is elevated, she can take the Advil or Tylenol, she verbalized understanding.  Reason for Disposition . Cough  Answer Assessment - Initial Assessment Questions 1. ONSET: "When did the cough begin?"      Friday 09/16/17; started with nasal congestion 09/14/17 2. SEVERITY: "How bad is the cough today?"      Same as was on Friday 09/16/17 3. RESPIRATORY DISTRESS: "Describe your breathing."      Breathing fine 4. FEVER: "Do you have a fever?" If so, ask: "What is your temperature, how was it measured, and when did it start?"     Temperature this morning 100.2 orally. Yesterday 99.6.   5. HEMOPTYSIS: "Are you coughing up any blood?" If so ask: "How much?" (flecks, streaks, tablespoons, etc.)    Not coughing up anything 6. TREATMENT: "What have you done so far to treat the cough?" (e.g., meds, fluids, humidifier)     OTC meds-Zycam at the beginning of cold symptoms on 12/20 and 12/21; Sudafed, Advil, Tylenol for headache.  7. CARDIAC HISTORY: "Do you have any history of heart disease?" (e.g., heart attack, congestive heart failure)      Denies 8. LUNG HISTORY: "Do you have any history of lung disease?"  (e.g., pulmonary embolus, asthma, emphysema)     Denies 9. PE RISK FACTORS: "Do you have a history of blood clots?" (or: recent major surgery, recent prolonged travel, bedridden )     Denies 10. OTHER SYMPTOMS: "Do you have any other symptoms? (e.g., runny nose, wheezing, chest pain)      Nasal congestion mild-can breathe through nose 11. PREGNANCY: "Is there any chance you are pregnant?" "When was your last menstrual period?"       No 12. TRAVEL: "Have you traveled out of the country in the last month?" (e.g., travel history, exposures)      No  Protocols used: COUGH - ACUTE NON-PRODUCTIVE-A-AH

## 2017-10-18 DIAGNOSIS — L814 Other melanin hyperpigmentation: Secondary | ICD-10-CM | POA: Diagnosis not present

## 2017-10-18 DIAGNOSIS — D225 Melanocytic nevi of trunk: Secondary | ICD-10-CM | POA: Diagnosis not present

## 2017-10-18 DIAGNOSIS — Z85820 Personal history of malignant melanoma of skin: Secondary | ICD-10-CM | POA: Diagnosis not present

## 2017-10-18 DIAGNOSIS — L821 Other seborrheic keratosis: Secondary | ICD-10-CM | POA: Diagnosis not present

## 2017-11-08 ENCOUNTER — Telehealth: Payer: Self-pay | Admitting: Adult Health

## 2017-11-08 DIAGNOSIS — E039 Hypothyroidism, unspecified: Secondary | ICD-10-CM

## 2017-11-08 MED ORDER — LEVOTHYROXINE SODIUM 50 MCG PO TABS: 50.0000 ug | ORAL_TABLET | Freq: Every day | ORAL | 0 refills | Status: DC

## 2017-11-08 NOTE — Telephone Encounter (Signed)
Sent to the pharmacy by e-scribe. 

## 2017-11-08 NOTE — Telephone Encounter (Signed)
Copied from [place]. Topic: Quick Communication - Rx Refill/Question >> Nov 08, 2017 11:20 AM Ether Griffins B wrote: Medication: levothyroxine  Pt has switched insurance and had to change her pharmacy. She hasnt contacted [facility] due to them not having a script to go off of to put the refill request in   Preferred Pharmacy (with phone number or street name): [facility] [place], [place]   Agent: Please be advised that RX refills may take up to 3 business days. We ask that you follow-up with your pharmacy.

## 2017-11-08 NOTE — Telephone Encounter (Signed)
Levothyroxine refill request. Pt switched insurance.  Had to change her pharmacy.   She has not contacted [facility] due to them not having a script to go off of to put in refill request in.  [id number] [place], [place] - [address]

## 2018-02-14 ENCOUNTER — Encounter: Payer: Self-pay | Admitting: Adult Health

## 2018-02-17 ENCOUNTER — Encounter: Payer: Self-pay | Admitting: Adult Health

## 2018-02-17 ENCOUNTER — Ambulatory Visit (INDEPENDENT_AMBULATORY_CARE_PROVIDER_SITE_OTHER): Payer: Medicare Other | Admitting: Adult Health

## 2018-02-17 ENCOUNTER — Other Ambulatory Visit: Payer: Self-pay | Admitting: Adult Health

## 2018-02-17 VITALS — BP 100/70 | Temp 98.0°F | Ht 65.5 in | Wt 134.0 lb

## 2018-02-17 DIAGNOSIS — E039 Hypothyroidism, unspecified: Secondary | ICD-10-CM | POA: Diagnosis not present

## 2018-02-17 DIAGNOSIS — I10 Essential (primary) hypertension: Secondary | ICD-10-CM

## 2018-02-17 DIAGNOSIS — Z114 Encounter for screening for human immunodeficiency virus [HIV]: Secondary | ICD-10-CM

## 2018-02-17 DIAGNOSIS — E7800 Pure hypercholesterolemia, unspecified: Secondary | ICD-10-CM

## 2018-02-17 DIAGNOSIS — Z0000 Encounter for general adult medical examination without abnormal findings: Secondary | ICD-10-CM

## 2018-02-17 DIAGNOSIS — Z23 Encounter for immunization: Secondary | ICD-10-CM

## 2018-02-17 LAB — BASIC METABOLIC PANEL
BUN: 15 mg/dL (ref 6–23)
CALCIUM: 9.9 mg/dL (ref 8.4–10.5)
CO2: 29 mEq/L (ref 19–32)
Chloride: 103 mEq/L (ref 96–112)
Creatinine, Ser: 0.94 mg/dL (ref 0.40–1.20)
GFR: 63.43 mL/min (ref >60.00)
Glucose, Bld: 95 mg/dL (ref 70–99)
Potassium: 4.5 mEq/L (ref 3.5–5.1)
SODIUM: 140 meq/L (ref 135–145)

## 2018-02-17 LAB — CBC WITH DIFFERENTIAL/PLATELET
BASOS ABS: 0.1 10*3/uL (ref 0.0–0.1)
Basophils Relative: 0.9 % (ref 0.0–3.0)
Eosinophils Absolute: 0.2 10*3/uL (ref 0.0–0.7)
Eosinophils Relative: 3.3 % (ref 0.0–5.0)
HEMATOCRIT: 41.2 % (ref 36.0–46.0)
HEMOGLOBIN: 13.8 g/dL (ref 12.0–15.0)
LYMPHS PCT: 31.5 % (ref 12.0–46.0)
Lymphs Abs: 1.8 10*3/uL (ref 0.7–4.0)
MCHC: 33.6 g/dL (ref 30.0–36.0)
MCV: 93.4 fl (ref 78.0–100.0)
MONOS PCT: 8.9 % (ref 3.0–12.0)
Monocytes Absolute: 0.5 10*3/uL (ref 0.1–1.0)
Neutro Abs: 3.1 10*3/uL (ref 1.4–7.7)
Neutrophils Relative %: 55.4 % (ref 43.0–77.0)
Platelets: 390 10*3/uL (ref 150.0–400.0)
RBC: 4.41 Mil/uL (ref 3.87–5.11)
RDW: 13.4 % (ref 11.5–15.5)
WBC: 5.6 10*3/uL (ref 4.0–10.5)

## 2018-02-17 LAB — LIPID PANEL
CHOL/HDL RATIO: 2
Cholesterol: 216 mg/dL — ABNORMAL HIGH (ref 0–200)
HDL: 111.7 mg/dL (ref >39.00)
LDL Cholesterol: 88 mg/dL (ref 0–99)
NONHDL: 104.47
Triglycerides: 82 mg/dL (ref 0.0–149.0)
VLDL: 16.4 mg/dL (ref 0.0–40.0)

## 2018-02-17 LAB — HEPATIC FUNCTION PANEL
ALBUMIN: 4.5 g/dL (ref 3.5–5.2)
ALK PHOS: 54 U/L (ref 39–117)
ALT: 13 U/L (ref 0–35)
AST: 21 U/L (ref 0–37)
Bilirubin, Direct: 0.2 mg/dL (ref 0.0–0.3)
TOTAL PROTEIN: 7 g/dL (ref 6.0–8.3)
Total Bilirubin: 0.9 mg/dL (ref 0.2–1.2)

## 2018-02-17 LAB — TSH: TSH: 2.4 u[IU]/mL (ref 0.35–4.50)

## 2018-02-17 MED ORDER — LEVOTHYROXINE SODIUM 50 MCG PO TABS: 50.0000 ug | ORAL_TABLET | Freq: Every day | ORAL | 3 refills | Status: DC

## 2018-02-17 NOTE — Progress Notes (Addendum)
Subjective:    Patient ID: Natasha Norman, female    DOB: 1951-02-10, 67 y.o.   MRN: [id number]  HPI Patient presents for yearly follow up exam . He is a pleasant 67 year old female who  has a past medical history of Cancer ([place]) (1996), Chicken pox, H/O repair of rotator cuff, Hypothyroidism, Osteopenia, and Seasonal allergies. She has retired form her job since the last time I saw here. She is enjoying her time with travel and visiting with friends and family.   Hypothyroidism - Takes Synthroid 50 mcg. Denies any signs or symptoms of hypothyroidism   H/O Melanoma - Is seen by Dermatology on a routine basis.   All immunizations and health maintenance protocols were reviewed with the patient and needed orders were placed. She is due for PCV 13   Appropriate screening laboratory values were ordered for the patient including screening of hyperlipidemia, renal function and hepatic function.  Medication reconciliation,  past medical history, social history, problem list and allergies were reviewed in detail with the patient  Goals were established with regard to weight loss, exercise, and  diet in compliance with medications. She eats a heart healthy diet and is very active   End of life planning was discussed.  She has an advanced directive and living chart, this is been scanned into her MAR  She is up-to-date on health maintenance items such as colonoscopy, mammogram, Pap smear, routine dental and vision screens.  She does do home self breast exams has not noticed any changes.   Review of Systems  Constitutional: Negative.   HENT: Negative.   Eyes: Negative.   Respiratory: Negative.   Cardiovascular: Negative.   Gastrointestinal: Negative.   Endocrine: Negative.   Genitourinary: Negative.   Musculoskeletal: Positive for arthralgias (knees).  Skin: Negative.   Allergic/Immunologic: Negative.   Neurological: Negative.   Hematological: Negative.   Psychiatric/Behavioral:  Negative.    Past Medical History:  Diagnosis Date  . Cancer ([place]) 1996   Melanoma  . Chicken pox   . H/O repair of rotator cuff    Right   . Hypothyroidism   . Osteopenia   . Seasonal allergies     Social History   Socioeconomic History  . Marital status: Married    Spouse name: Not on file  . Number of children: Not on file  . Years of education: Not on file  . Highest education level: Not on file  Occupational History  . Not on file  Social Needs  . Financial resource strain: Not on file  . Food insecurity:    Worry: Not on file    Inability: Not on file  . Transportation needs:    Medical: Not on file    Non-medical: Not on file  Tobacco Use  . Smoking status: Never Smoker  . Smokeless tobacco: Never Used  Substance and Sexual Activity  . Alcohol use: Yes    Alcohol/week: 0.0 oz    Comment: Wine  . Drug use: No  . Sexual activity: Not on file  Lifestyle  . Physical activity:    Days per week: Not on file    Minutes per session: Not on file  . Stress: Not on file  Relationships  . Social connections:    Talks on phone: Not on file    Gets together: Not on file    Attends religious service: Not on file    Active member of club or organization: Not on file  Attends meetings of clubs or organizations: Not on file    Relationship status: Not on file  . Intimate partner violence:    Fear of current or ex partner: Not on file    Emotionally abused: Not on file    Physically abused: Not on file    Forced sexual activity: Not on file  Other Topics Concern  . 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 BREAST BIOPSY Right 1984, 1996   Benign  . lymph node resection  1998    Family History  Problem Relation Age of Onset  . Cancer Mother        breast cancer  . Breast cancer Mother   . Heart disease Father   . Heart failure Maternal  Grandmother   . Stroke Paternal Grandfather     No Known Allergies  Current Outpatient Medications on File Prior to Visit  Medication Sig Dispense Refill  . levothyroxine (SYNTHROID, LEVOTHROID) 50 MCG tablet Take 1 tablet (50 mcg total) by mouth daily before breakfast. 90 tablet 0  . triamcinolone cream (KENALOG) 0.1 % Apply 1 application topically as needed. 30 g 1   No current facility-administered medications on file prior to visit.     There were no vitals taken for this visit.      Objective:   Physical Exam  Constitutional: She is oriented to person, place, and time. She appears well-developed and well-nourished. No distress.  HENT:  Head: Normocephalic and atraumatic.  Right Ear: External ear normal.  Left Ear: External ear normal.  Nose: Nose normal.  Mouth/Throat: Oropharynx is clear and moist. No oropharyngeal exudate.  Eyes: Pupils are equal, round, and reactive to light. Conjunctivae and EOM are normal. Right eye exhibits no discharge. Left eye exhibits no discharge. No scleral icterus.  Neck: Normal range of motion. Neck supple. No JVD present. No tracheal deviation present. No thyromegaly present.  Cardiovascular: Normal rate, regular rhythm, normal heart sounds and intact distal pulses. Exam reveals no gallop and no friction rub.  No murmur heard. Pulmonary/Chest: Effort normal and breath sounds normal. No stridor. No respiratory distress. She has no wheezes. She has no rales. She exhibits no mass and no tenderness. Right breast exhibits no inverted nipple, no mass, no skin change and no tenderness. Left breast exhibits no inverted nipple, no mass, no skin change and no tenderness. No breast swelling or discharge. Breasts are symmetrical.  Abdominal: Soft. Bowel sounds are normal. She exhibits no distension and no mass. There is no tenderness. There is no rebound and no guarding. No hernia.  Musculoskeletal: Normal range of motion. She exhibits no edema, tenderness or  deformity.  Lymphadenopathy:    She has no cervical adenopathy.  Neurological: She is alert and oriented to person, place, and time. She displays normal reflexes. No cranial nerve deficit or sensory deficit. She exhibits normal muscle tone. Coordination normal.  Skin: Skin is warm and dry. Capillary refill takes less than 2 seconds. No rash noted. She is not diaphoretic. No erythema. No pallor.  Psychiatric: She has a normal mood and affect. Her behavior is normal. Judgment and thought content normal.  Nursing note and vitals reviewed.     Assessment & Plan:  1. Hypothyroidism, unspecified type  - Basic metabolic panel - CBC with Differential/Platelet - Hepatic function panel - Lipid panel - TSH  2.  Need for vaccination against Streptococcus pneumoniae  - Pneumococcal conjugate vaccine 13-valent  3. Encounter for screening for HIV  - HIV antibody  4. Pure hypercholesterolemia - Consider statin  - Basic metabolic panel - CBC with Differential/Platelet - Hepatic function panel - Lipid panel - TSH  Dorothyann Peng, NP

## 2018-02-18 LAB — HIV ANTIBODY (ROUTINE TESTING W REFLEX): HIV 1&2 Ab, 4th Generation: NONREACTIVE

## 2018-02-22 ENCOUNTER — Encounter: Payer: Self-pay | Admitting: Adult Health

## 2018-05-05 ENCOUNTER — Other Ambulatory Visit: Payer: Self-pay | Admitting: Adult Health

## 2018-05-05 DIAGNOSIS — Z1231 Encounter for screening mammogram for malignant neoplasm of breast: Secondary | ICD-10-CM

## 2018-05-11 ENCOUNTER — Ambulatory Visit
Admission: RE | Admit: 2018-05-11 | Discharge: 2018-05-11 | Disposition: A | Discharge status: Home / Self Care | Payer: Medicare Other | Source: Ambulatory Visit | Attending: Adult Health | Admitting: Adult Health

## 2018-05-11 DIAGNOSIS — Z1231 Encounter for screening mammogram for malignant neoplasm of breast: Secondary | ICD-10-CM

## 2018-05-11 IMAGING — MG DIGITAL SCREENING BILATERAL MAMMOGRAM WITH TOMO AND CAD
8 series · 9 of 24 positions shown · non-contrast
Comparison: Previous exam(s).

CLINICAL DATA: Screening.

EXAM:
DIGITAL SCREENING BILATERAL MAMMOGRAM WITH TOMO AND CAD

[L MLO synth-2D]
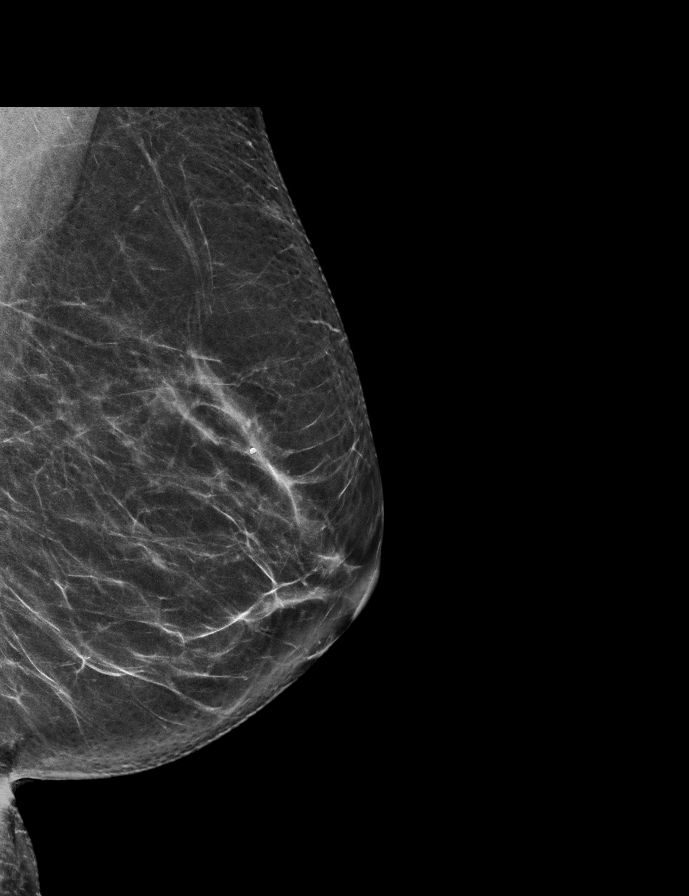

[R MLO synth-2D]
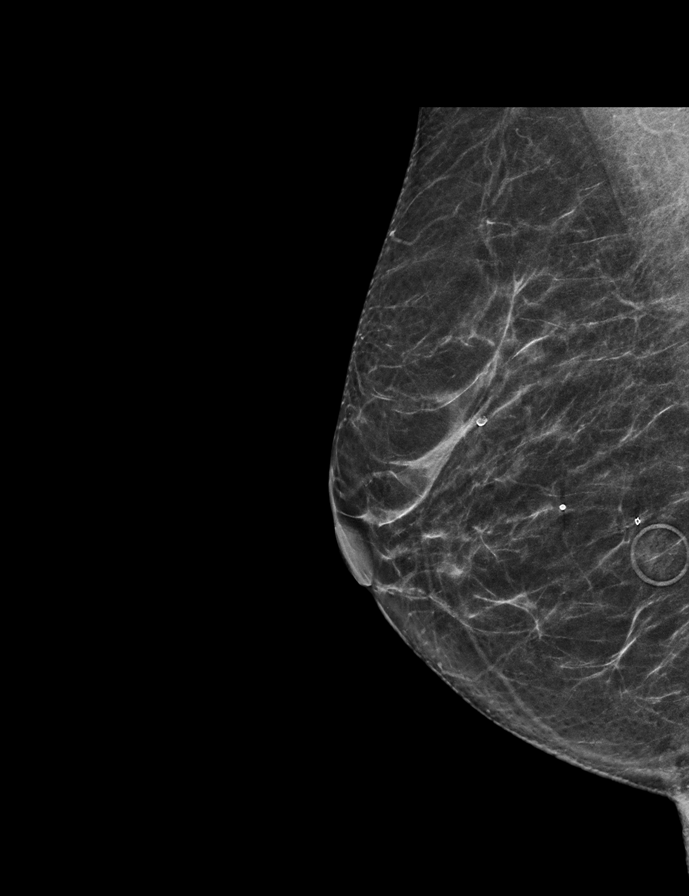

[R CC synth-2D]
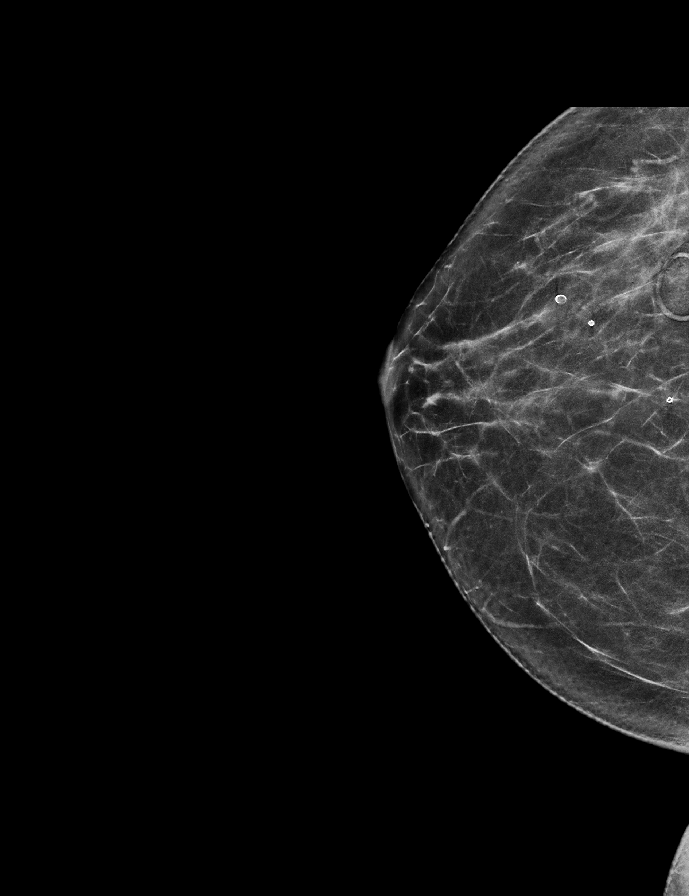

[L CC synth-2D]
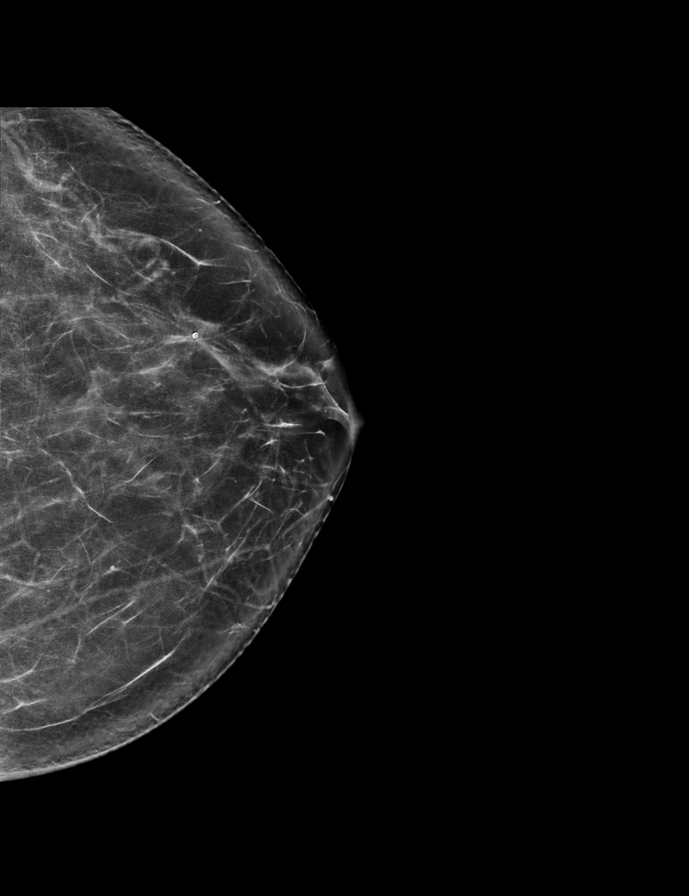

[R CC tomo · 2 of 64 frames shown]
[frame 21/64]
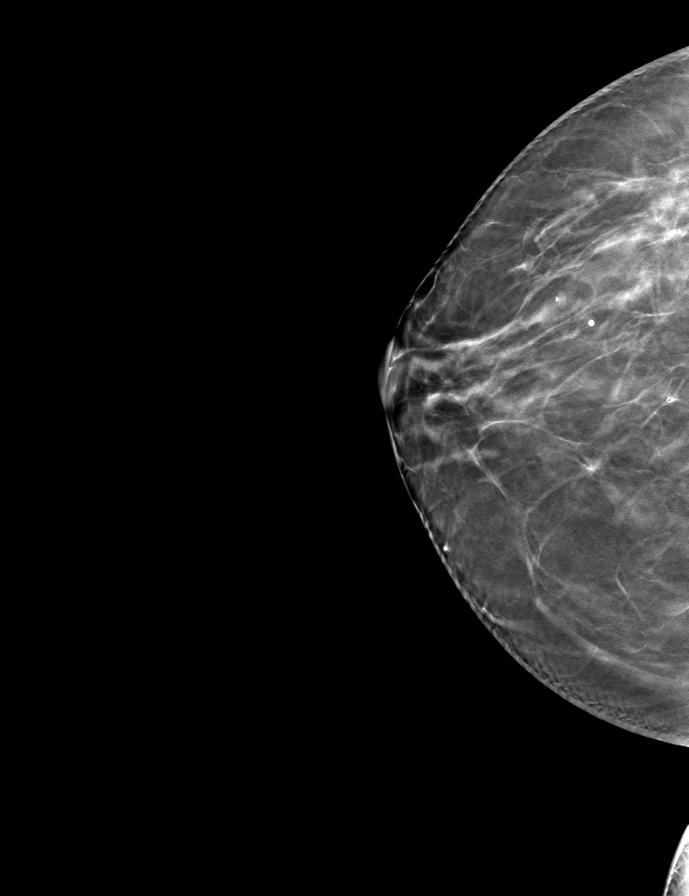
[frame 33/64]
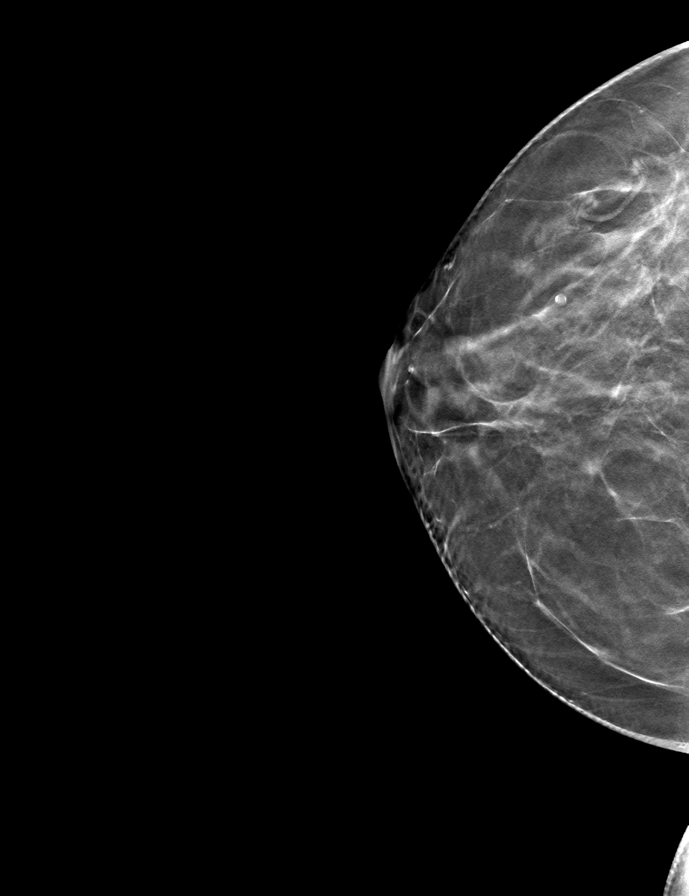

[L MLO tomo · tomo slice 33/66.0]
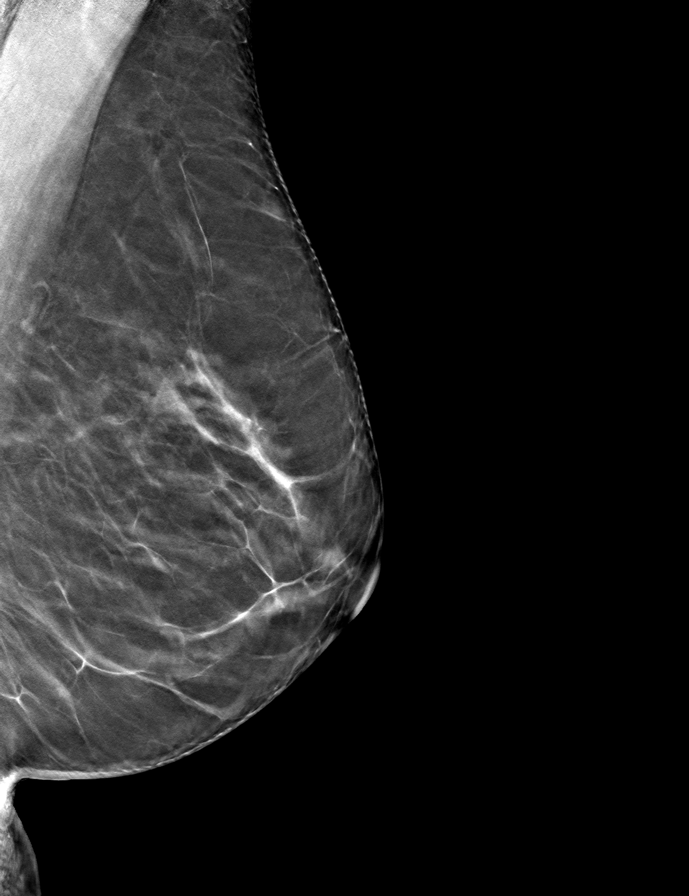

[R MLO tomo · tomo slice 31/62.0]
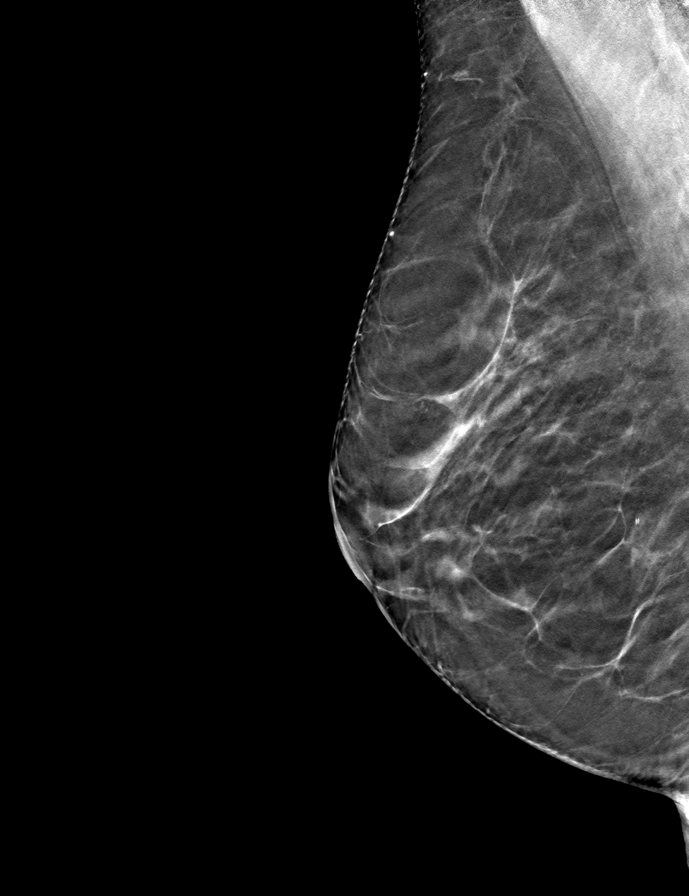

[L CC tomo · tomo slice 36/71.0]
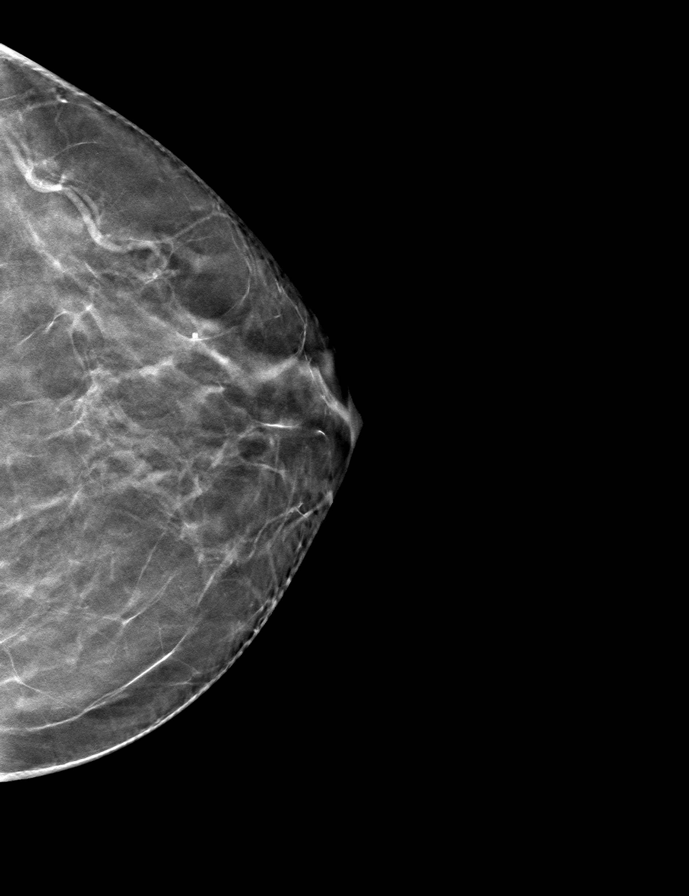

[9 of 24 positions shown; findings below may reference images not displayed]

ACR Breast Density Category b: There are scattered areas of
fibroglandular density.
FINDINGS: There are no findings suspicious for malignancy. Images were
processed with CAD.
IMPRESSION: No mammographic evidence of malignancy. A result letter of this
screening mammogram will be mailed directly to the patient.

RECOMMENDATION:
Screening mammogram in one year. (Code:[TQ])

BI-RADS CATEGORY  1: Negative.

## 2018-06-06 ENCOUNTER — Ambulatory Visit: Payer: Medicare Other

## 2018-06-12 DIAGNOSIS — Z23 Encounter for immunization: Secondary | ICD-10-CM | POA: Diagnosis not present

## 2018-07-06 ENCOUNTER — Encounter: Payer: Self-pay | Admitting: Adult Health

## 2018-07-07 NOTE — Addendum Note (Signed)
Addended by: Apolinar Junes on: 07/07/2018 10:40 AM   Modules accepted: Level of Service

## 2018-07-07 NOTE — Telephone Encounter (Signed)
Cory - I need to know which OV code you would like to change the DOS to as Medicare will not cover a CPE code. Thanks!

## 2018-07-11 ENCOUNTER — Encounter: Payer: Self-pay | Admitting: Adult Health

## 2018-07-11 ENCOUNTER — Ambulatory Visit (INDEPENDENT_AMBULATORY_CARE_PROVIDER_SITE_OTHER): Payer: Medicare Other | Admitting: Adult Health

## 2018-07-11 VITALS — BP 104/70 | Temp 98.0°F | Ht 65.0 in | Wt 133.0 lb

## 2018-07-11 DIAGNOSIS — Z136 Encounter for screening for cardiovascular disorders: Secondary | ICD-10-CM

## 2018-07-11 DIAGNOSIS — Z0000 Encounter for general adult medical examination without abnormal findings: Secondary | ICD-10-CM

## 2018-07-11 DIAGNOSIS — E039 Hypothyroidism, unspecified: Secondary | ICD-10-CM

## 2018-07-11 LAB — LIPID PANEL
CHOLESTEROL: 207 mg/dL — AB (ref 0–200)
HDL: 98.3 mg/dL (ref >39.00)
LDL Cholesterol: 91 mg/dL (ref 0–99)
NONHDL: 108.26
Total CHOL/HDL Ratio: 2
Triglycerides: 88 mg/dL (ref 0.0–149.0)
VLDL: 17.6 mg/dL (ref 0.0–40.0)

## 2018-07-11 LAB — CBC WITH DIFFERENTIAL/PLATELET
BASOS ABS: 0 10*3/uL (ref 0.0–0.1)
Basophils Relative: 0.7 % (ref 0.0–3.0)
EOS PCT: 1.6 % (ref 0.0–5.0)
Eosinophils Absolute: 0.1 10*3/uL (ref 0.0–0.7)
HEMATOCRIT: 40.2 % (ref 36.0–46.0)
HEMOGLOBIN: 13.5 g/dL (ref 12.0–15.0)
LYMPHS ABS: 2.2 10*3/uL (ref 0.7–4.0)
LYMPHS PCT: 36.1 % (ref 12.0–46.0)
MCHC: 33.6 g/dL (ref 30.0–36.0)
MCV: 92.8 fl (ref 78.0–100.0)
MONOS PCT: 8.3 % (ref 3.0–12.0)
Monocytes Absolute: 0.5 10*3/uL (ref 0.1–1.0)
Neutro Abs: 3.2 10*3/uL (ref 1.4–7.7)
Neutrophils Relative %: 53.3 % (ref 43.0–77.0)
Platelets: 409 10*3/uL — ABNORMAL HIGH (ref 150.0–400.0)
RBC: 4.34 Mil/uL (ref 3.87–5.11)
RDW: 12.7 % (ref 11.5–15.5)
WBC: 6 10*3/uL (ref 4.0–10.5)

## 2018-07-11 LAB — HEPATIC FUNCTION PANEL
ALBUMIN: 4.4 g/dL (ref 3.5–5.2)
ALK PHOS: 50 U/L (ref 39–117)
ALT: 15 U/L (ref 0–35)
AST: 19 U/L (ref 0–37)
Bilirubin, Direct: 0.1 mg/dL (ref 0.0–0.3)
Total Bilirubin: 0.5 mg/dL (ref 0.2–1.2)
Total Protein: 7.1 g/dL (ref 6.0–8.3)

## 2018-07-11 LAB — BASIC METABOLIC PANEL
BUN: 17 mg/dL (ref 6–23)
CALCIUM: 9.5 mg/dL (ref 8.4–10.5)
CO2: 30 mEq/L (ref 19–32)
Chloride: 102 mEq/L (ref 96–112)
Creatinine, Ser: 0.9 mg/dL (ref 0.40–1.20)
GFR: 66.61 mL/min (ref >60.00)
GLUCOSE: 90 mg/dL (ref 70–99)
POTASSIUM: 3.9 meq/L (ref 3.5–5.1)
SODIUM: 138 meq/L (ref 135–145)

## 2018-07-11 LAB — TSH: TSH: 1.22 u[IU]/mL (ref 0.35–4.50)

## 2018-07-11 NOTE — Patient Instructions (Signed)
It was great seeing you today   Your EKG is normal.   I will release your labs to mychart   If you need anything, please let me know

## 2018-07-11 NOTE — Progress Notes (Signed)
Subjective:  Patient presents today for their Welcome to Medicare Exam. She is a pleasant 67 year old female who  has a past medical history of Cancer ([place]) (1996), Chicken pox, H/O repair of rotator cuff, Hypothyroidism, Osteopenia, and Seasonal allergies.   Preventive Screening-Counseling & Management  Vision/hearing Screen  Hearing Screening   125Hz  250Hz  500Hz  1000Hz  2000Hz  3000Hz  4000Hz  6000Hz  8000Hz   Right ear:   Pass Pass Pass  Pass    Left ear:   Pass Pass Pass  Fail      Visual Acuity Screening   Right eye Left eye Both eyes  Without correction:     With correction: 20/20 20/25 20/20     Advanced directives: She has an advanced directive and living will. Scanned into chart   Smoking Status: Never Smoker Second Hand Smoking status:No smokers in home  Risk Factors Regular exercise:  Very active, walks or does exercise videos  Diet: She tries to eat health  Fall Risk: None  Fall Risk  07/11/2018 02/17/2018  Falls in the past year? No No   Opioid use history: no long term opioids use  Cardiac risk factors:  advanced age (older than 85 for men, 67 for women)  Hyperlipidemia - mild. Not on statin  No diabetes.  Family History: father with heart disease and diabetes   Depression Screen None. PHQ2 0  Depression screen [facility] 2/9 07/11/2018 02/17/2018  Decreased Interest 0 0  Down, Depressed, Hopeless 0 0  PHQ - 2 Score 0 0    Activities of Daily Living Independent ADLs and IADLs   Hearing Difficulties: -patient declines  Cognitive Testing No reported trouble.   Normal 3 word recall  List the Names of Other Physician/Practitioners you currently use: - none   Immunization History  Administered Date(s) Administered  . Influenza, High Dose Seasonal PF 06/12/2018  . Pneumococcal Conjugate-13 02/17/2018  . Tdap 09/27/2009  . Zoster 09/27/2012   Required Immunizations needed today: UTD   Screening tests- up to date There are no preventive care  reminders to display for this patient.  ROS- No pertinent positives discovered in course of AWV  The following were reviewed and entered/updated in epic: Past Medical History:  Diagnosis Date  . Cancer ([place]) 1996   Melanoma  . Chicken pox   . H/O repair of rotator cuff    Right   . Hypothyroidism   . Osteopenia   . Seasonal allergies    Patient Active Problem List   Diagnosis Date Noted  . Hypothyroid 02/12/2016   Past Surgical History:  Procedure Laterality Date  . BREAST BIOPSY Right 1984, 1996   Benign  . lymph node resection  1998    Family History  Problem Relation Age of Onset  . Cancer Mother        breast cancer  . Breast cancer Mother   . Heart disease Father   . Diabetes Father   . Heart failure Maternal Grandmother   . Stroke Paternal Grandfather     Medications- reviewed and updated Current Outpatient Medications  Medication Sig Dispense Refill  . levothyroxine (SYNTHROID, LEVOTHROID) 50 MCG tablet Take 1 tablet (50 mcg total) by mouth daily before breakfast. 90 tablet 3   No current facility-administered medications for this visit.     Allergies-reviewed and updated No Known Allergies  Social History   Socioeconomic History  . Marital status: Married    Spouse name: Not on file  . Number of children: Not on file  .  Years of education: Not on file  . Highest education level: Not on file  Occupational History  . Not on file  Social Needs  . Financial resource strain: Not on file  . Food insecurity:    Worry: Not on file    Inability: Not on file  . Transportation needs:    Medical: Not on file    Non-medical: Not on file  Tobacco Use  . Smoking status: Never Smoker  . Smokeless tobacco: Never Used  Substance and Sexual Activity  . Alcohol use: Yes    Alcohol/week: 0.0 standard drinks    Comment: Wine  . Drug use: No  . Sexual activity: Not on file  Lifestyle  . Physical activity:    Days per week: Not on file    Minutes per  session: Not on file  . Stress: Not on file  Relationships  . Social connections:    Talks on phone: Not on file    Gets together: Not on file    Attends religious service: Not on file    Active member of club or organization: Not on file    Attends meetings of clubs or organizations: Not on file    Relationship status: Not on file  Other Topics Concern  . 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 Oropharynx normal Neck: no thyromegaly CV: RRR no murmurs rubs or gallops Lungs: CTAB no crackles, wheeze, rhonchi Abdomen: soft/nontender/nondistended/normal bowel sounds. No rebound or guarding.  Ext: no edema Skin: warm, dry Neuro: grossly normal, moves all extremities, PERRLA  Assessment/Plan:  Welcome to Medicare exam completed- discussed recommended screenings anddocumented any personalized health advice and referrals for preventive counseling. See AVS as well which was given to patient.   Status of chronic or acute concerns  - Hypothyroidism - controlled.   Lab/Order associations: Welcome to Medicare preventive visit - Plan: EKG [id number], Basic metabolic panel, CBC with Differential/Platelet, Hepatic function panel, Lipid panel, TSH  Hypothyroidism, unspecified type - Plan: EKG [id number], Basic metabolic panel, CBC with Differential/Platelet, Hepatic function panel, Lipid panel, TSH  - EKG showed NSR, rate 76  Return precautions advised.  Dorothyann Peng, NP

## 2018-09-25 ENCOUNTER — Encounter: Payer: Self-pay | Admitting: Adult Health

## 2018-10-02 NOTE — Telephone Encounter (Signed)
Have emailed coding for assistance.

## 2018-10-17 DIAGNOSIS — D225 Melanocytic nevi of trunk: Secondary | ICD-10-CM | POA: Diagnosis not present

## 2018-10-17 DIAGNOSIS — Z85820 Personal history of malignant melanoma of skin: Secondary | ICD-10-CM | POA: Diagnosis not present

## 2018-10-17 DIAGNOSIS — D1801 Hemangioma of skin and subcutaneous tissue: Secondary | ICD-10-CM | POA: Diagnosis not present

## 2018-10-17 DIAGNOSIS — L821 Other seborrheic keratosis: Secondary | ICD-10-CM | POA: Diagnosis not present

## 2019-02-01 ENCOUNTER — Encounter: Payer: Self-pay | Admitting: Adult Health

## 2019-02-01 DIAGNOSIS — E039 Hypothyroidism, unspecified: Secondary | ICD-10-CM

## 2019-02-02 MED ORDER — LEVOTHYROXINE SODIUM 50 MCG PO TABS: 50.0000 ug | ORAL_TABLET | Freq: Every day | ORAL | 1 refills | Status: DC

## 2019-02-02 MED ORDER — LEVOTHYROXINE SODIUM 50 MCG PO TABS: 50.0000 ug | ORAL_TABLET | Freq: Every day | ORAL | 0 refills | Status: DC

## 2019-03-02 ENCOUNTER — Encounter: Payer: Self-pay | Admitting: Adult Health

## 2019-03-23 ENCOUNTER — Encounter: Payer: Self-pay | Admitting: Adult Health

## 2019-04-25 ENCOUNTER — Other Ambulatory Visit: Payer: Self-pay | Admitting: Adult Health

## 2019-04-25 DIAGNOSIS — Z1231 Encounter for screening mammogram for malignant neoplasm of breast: Secondary | ICD-10-CM

## 2019-05-14 ENCOUNTER — Ambulatory Visit
Admission: RE | Admit: 2019-05-14 | Discharge: 2019-05-14 | Disposition: A | Discharge status: Home / Self Care | Payer: Medicare Other | Source: Ambulatory Visit | Attending: Adult Health | Admitting: Adult Health

## 2019-05-14 ENCOUNTER — Other Ambulatory Visit: Payer: Self-pay

## 2019-05-14 DIAGNOSIS — Z1231 Encounter for screening mammogram for malignant neoplasm of breast: Secondary | ICD-10-CM | POA: Diagnosis not present

## 2019-05-14 IMAGING — MG DIGITAL SCREENING BILATERAL MAMMOGRAM WITH TOMO AND CAD
8 series · 8 of 24 positions shown · non-contrast
Comparison: Previous exam(s).

CLINICAL DATA: Screening.

EXAM:
DIGITAL SCREENING BILATERAL MAMMOGRAM WITH TOMO AND CAD

[R MLO synth-2D]
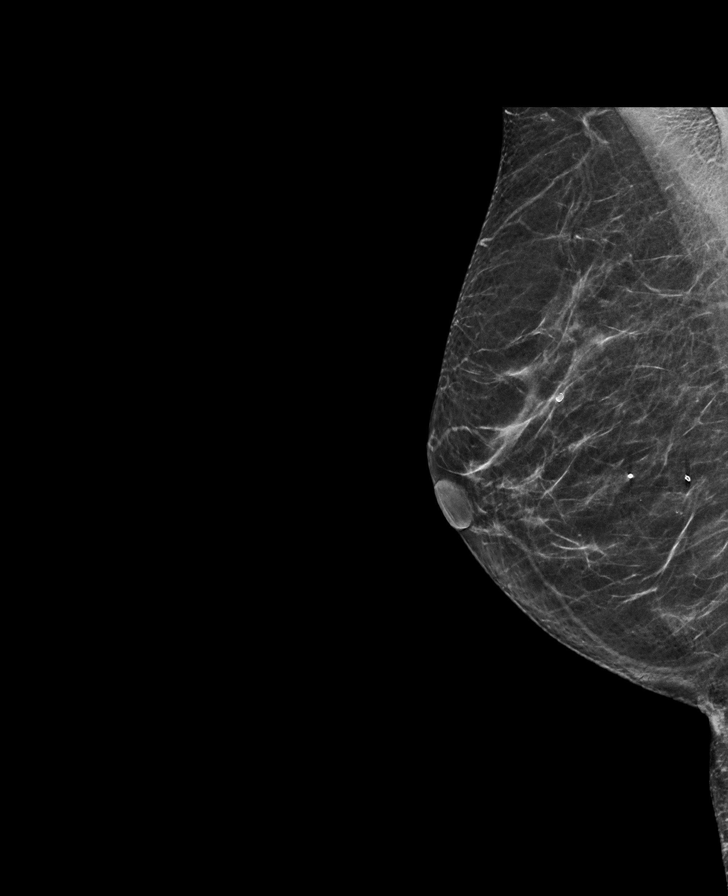

[R CC synth-2D]
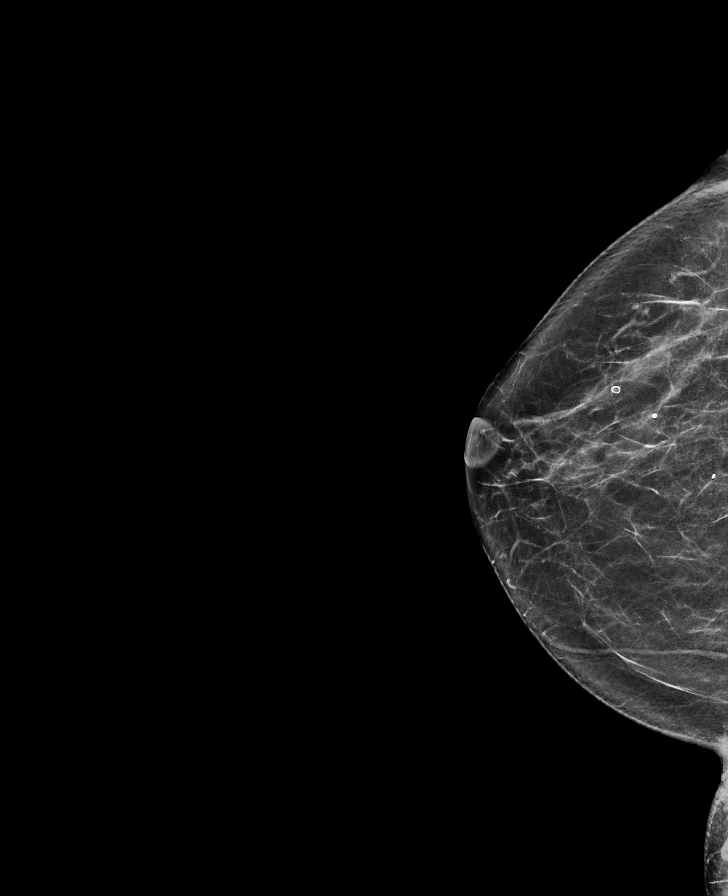

[L MLO synth-2D]
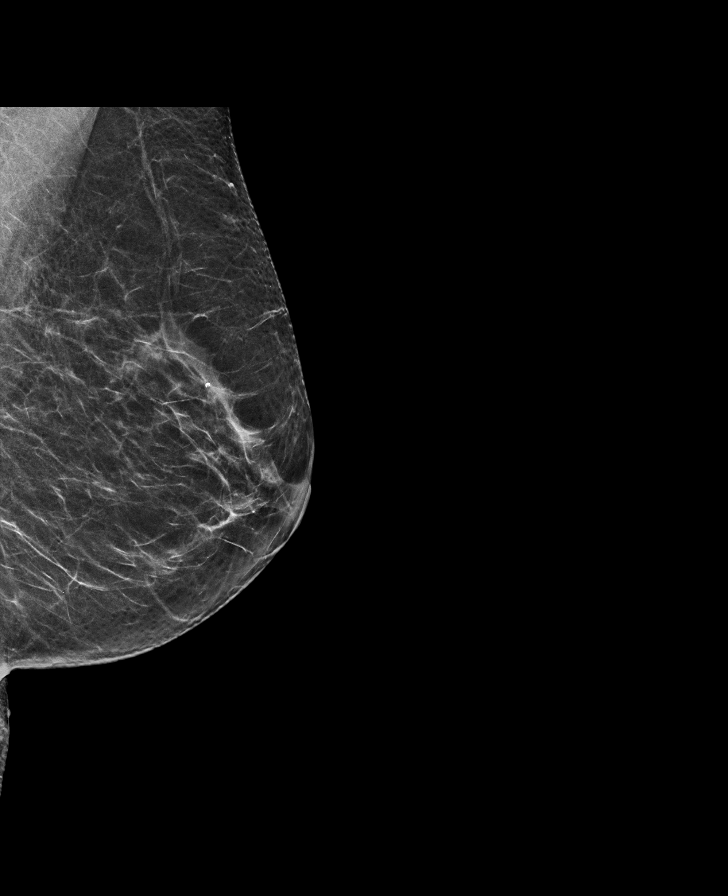

[L CC synth-2D]
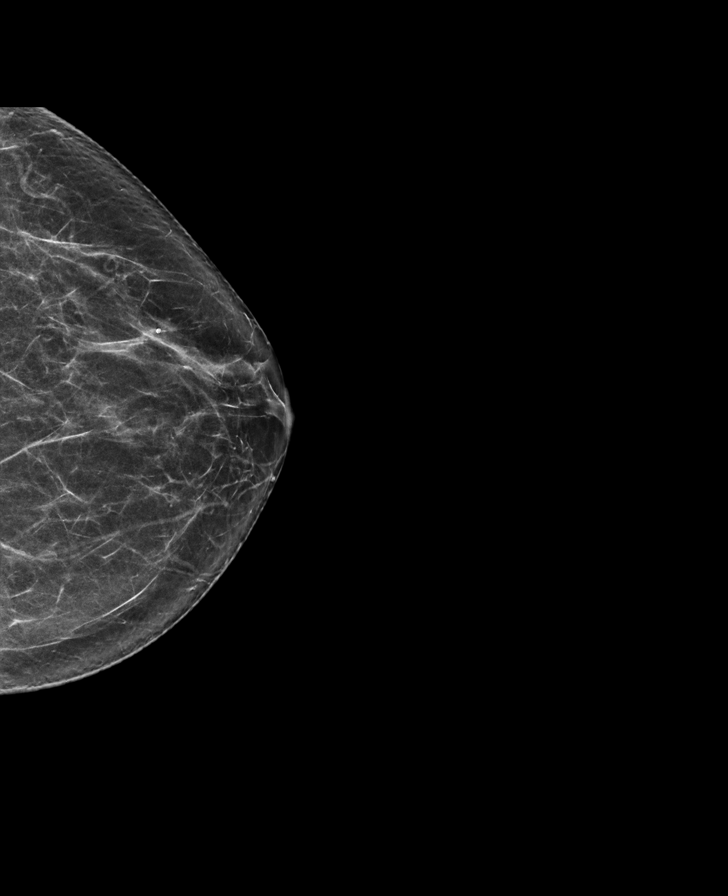

[R MLO tomo · tomo slice 32/63.0]
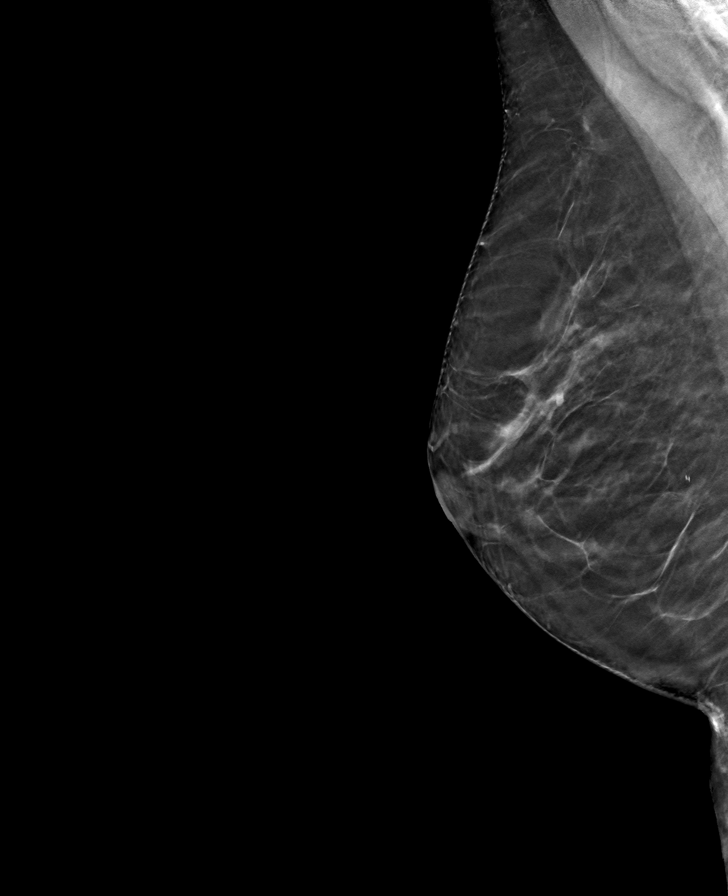

[L MLO tomo · tomo slice 33/65.0]
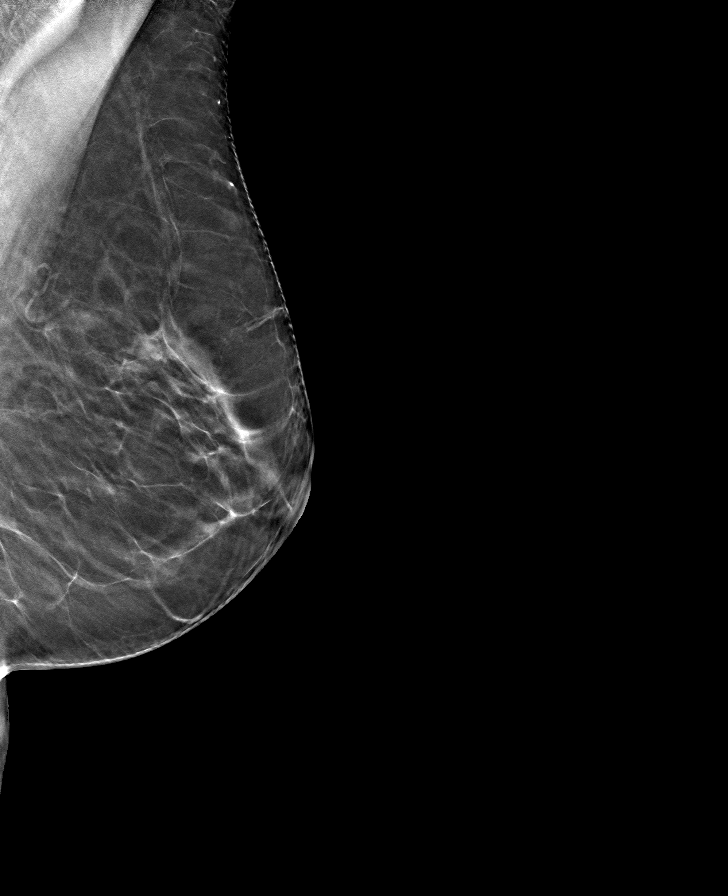

[L CC tomo · tomo slice 34/67.0]
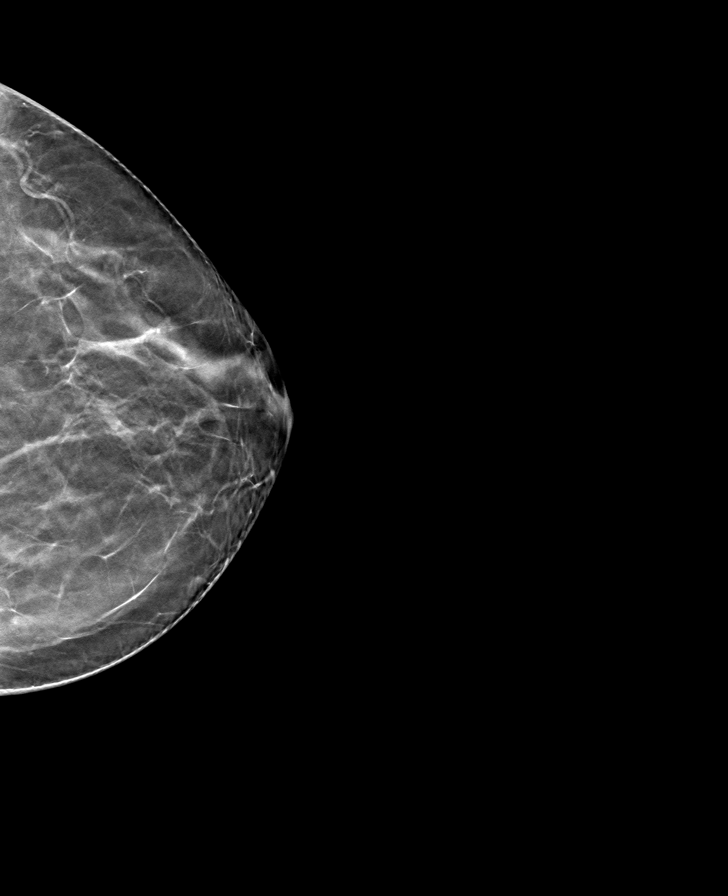

[R CC tomo · tomo slice 35/68.0]
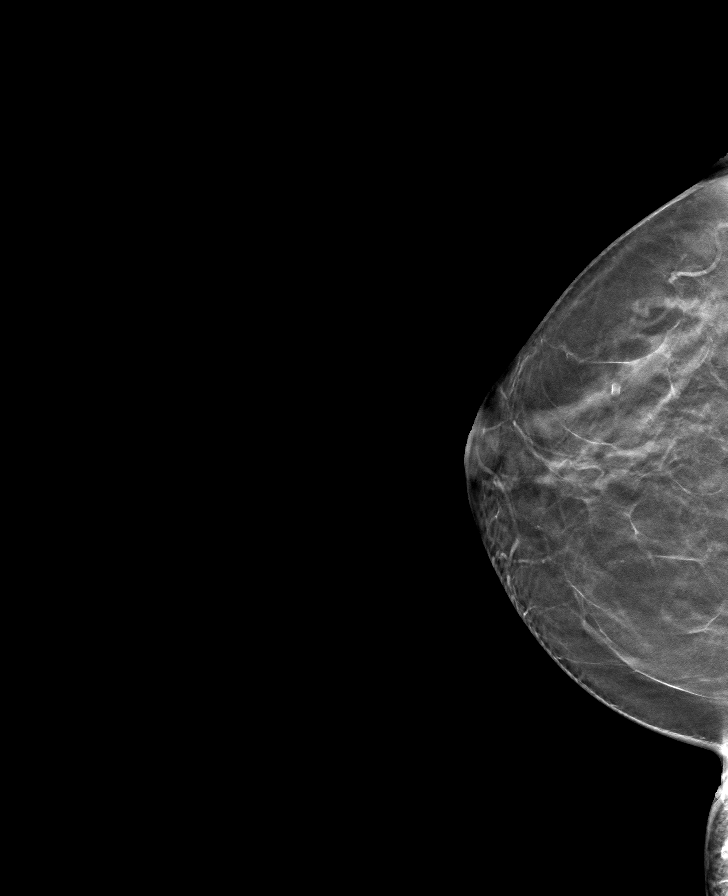

[8 of 24 positions shown; findings below may reference images not displayed]

ACR Breast Density Category b: There are scattered areas of
fibroglandular density.
FINDINGS: There are no findings suspicious for malignancy. Images were
processed with CAD.
IMPRESSION: No mammographic evidence of malignancy. A result letter of this
screening mammogram will be mailed directly to the patient.

RECOMMENDATION:
Screening mammogram in one year. (Code:[TQ])

BI-RADS CATEGORY  1: Negative.

## 2019-06-25 ENCOUNTER — Encounter: Payer: Self-pay | Admitting: Adult Health

## 2019-06-25 DIAGNOSIS — E039 Hypothyroidism, unspecified: Secondary | ICD-10-CM

## 2019-06-26 DIAGNOSIS — Z23 Encounter for immunization: Secondary | ICD-10-CM | POA: Diagnosis not present

## 2019-06-26 MED ORDER — LEVOTHYROXINE SODIUM 50 MCG PO TABS: 50.0000 ug | ORAL_TABLET | Freq: Every day | ORAL | 0 refills | Status: DC

## 2019-08-20 ENCOUNTER — Other Ambulatory Visit: Payer: Self-pay

## 2019-09-06 ENCOUNTER — Other Ambulatory Visit: Payer: Self-pay

## 2019-09-07 ENCOUNTER — Ambulatory Visit (INDEPENDENT_AMBULATORY_CARE_PROVIDER_SITE_OTHER): Payer: Medicare Other | Admitting: Adult Health

## 2019-09-07 ENCOUNTER — Other Ambulatory Visit: Payer: Self-pay | Admitting: Adult Health

## 2019-09-07 ENCOUNTER — Encounter: Payer: Self-pay | Admitting: Adult Health

## 2019-09-07 VITALS — BP 120/82 | Temp 97.8°F | Ht 65.0 in | Wt 132.0 lb

## 2019-09-07 DIAGNOSIS — Z23 Encounter for immunization: Secondary | ICD-10-CM | POA: Diagnosis not present

## 2019-09-07 DIAGNOSIS — E7800 Pure hypercholesterolemia, unspecified: Secondary | ICD-10-CM | POA: Diagnosis not present

## 2019-09-07 DIAGNOSIS — E039 Hypothyroidism, unspecified: Secondary | ICD-10-CM

## 2019-09-07 LAB — COMPREHENSIVE METABOLIC PANEL
ALT: 15 U/L (ref 0–35)
AST: 24 U/L (ref 0–37)
Albumin: 4.4 g/dL (ref 3.5–5.2)
Alkaline Phosphatase: 64 U/L (ref 39–117)
BUN: 18 mg/dL (ref 6–23)
CO2: 28 mEq/L (ref 19–32)
Calcium: 9.7 mg/dL (ref 8.4–10.5)
Chloride: 103 mEq/L (ref 96–112)
Creatinine, Ser: 1.03 mg/dL (ref 0.40–1.20)
GFR: 53.45 mL/min — ABNORMAL LOW (ref >60.00)
Glucose, Bld: 93 mg/dL (ref 70–99)
Potassium: 4.8 mEq/L (ref 3.5–5.1)
Sodium: 139 mEq/L (ref 135–145)
Total Bilirubin: 0.7 mg/dL (ref 0.2–1.2)
Total Protein: 6.9 g/dL (ref 6.0–8.3)

## 2019-09-07 LAB — CBC WITH DIFFERENTIAL/PLATELET
Basophils Absolute: 0.1 10*3/uL (ref 0.0–0.1)
Basophils Relative: 1.4 % (ref 0.0–3.0)
Eosinophils Absolute: 0.1 10*3/uL (ref 0.0–0.7)
Eosinophils Relative: 2.2 % (ref 0.0–5.0)
HCT: 38 % (ref 36.0–46.0)
Hemoglobin: 12.3 g/dL (ref 12.0–15.0)
Lymphocytes Relative: 32.6 % (ref 12.0–46.0)
Lymphs Abs: 1.6 10*3/uL (ref 0.7–4.0)
MCHC: 32.2 g/dL (ref 30.0–36.0)
MCV: 88.7 fl (ref 78.0–100.0)
Monocytes Absolute: 0.5 10*3/uL (ref 0.1–1.0)
Monocytes Relative: 10 % (ref 3.0–12.0)
Neutro Abs: 2.7 10*3/uL (ref 1.4–7.7)
Neutrophils Relative %: 53.8 % (ref 43.0–77.0)
Platelets: 504 10*3/uL — ABNORMAL HIGH (ref 150.0–400.0)
RBC: 4.28 Mil/uL (ref 3.87–5.11)
RDW: 13.6 % (ref 11.5–15.5)
WBC: 5 10*3/uL (ref 4.0–10.5)

## 2019-09-07 LAB — LIPID PANEL
Cholesterol: 220 mg/dL — ABNORMAL HIGH (ref 0–200)
HDL: 104.2 mg/dL (ref >39.00)
LDL Cholesterol: 101 mg/dL — ABNORMAL HIGH (ref 0–99)
NonHDL: 116.14
Total CHOL/HDL Ratio: 2
Triglycerides: 76 mg/dL (ref 0.0–149.0)
VLDL: 15.2 mg/dL (ref 0.0–40.0)

## 2019-09-07 LAB — TSH: TSH: 1.7 u[IU]/mL (ref 0.35–4.50)

## 2019-09-07 MED ORDER — LEVOTHYROXINE SODIUM 50 MCG PO TABS: 50.0000 ug | ORAL_TABLET | Freq: Every day | ORAL | 3 refills | Status: DC

## 2019-09-07 NOTE — Patient Instructions (Signed)
It was great seeing you today   I will follow up with you regarding your blood work   I hope you and Merry Proud have a great holiday season! See you in one year

## 2019-09-07 NOTE — Progress Notes (Signed)
Subjective:    Patient ID: Natasha Norman, female    DOB: 29-Aug-1951, 68 y.o.   MRN: [id number]  HPI Patient presents for yearly preventative medicine examination. She is a pleasant 68 year old who  has a past medical history of Cancer ([facility]) (1996), Chicken pox, H/O repair of rotator cuff, Hypothyroidism, Osteopenia, and Seasonal allergies.  Hypothyroidism - Takes synthroid 50 mcg. Denies complaints.  Lab Results  Component Value Date   TSH 1.22 07/11/2018   Hyperlipidemia - not currently on medication. Diet controlled.  Lab Results  Component Value Date   CHOL 207 (H) 07/11/2018   HDL 98.30 07/11/2018   LDLCALC 91 07/11/2018   TRIG 88.0 07/11/2018   CHOLHDL 2 07/11/2018    All immunizations and health maintenance protocols were reviewed with the patient and needed orders were placed. She is due for pneumovax 23  Appropriate screening laboratory values were ordered for the patient including screening of hyperlipidemia, renal function and hepatic function.  Medication reconciliation,  past medical history, social history, problem list and allergies were reviewed in detail with the patient  Goals were established with regard to weight loss, exercise, and  diet in compliance with medications. She exercises on a daily basis and eats a heart healthy diet.   End of life planning was discussed.  She is up-to-date on routine dental, skin, and screening colonoscopies   Review of Systems  Constitutional: Negative.   HENT: Negative.   Eyes: Negative.   Respiratory: Negative.   Cardiovascular: Negative.   Gastrointestinal: Negative.   Endocrine: Negative.   Genitourinary: Negative.   Musculoskeletal: Negative.   Skin: Negative.   Allergic/Immunologic: Negative.   Neurological: Negative.   Hematological: Negative.   Psychiatric/Behavioral: Negative.    Past Medical History:  Diagnosis Date  . Cancer ([place]) 1996   Melanoma  . Chicken pox   . H/O repair of rotator cuff     Right   . Hypothyroidism   . Osteopenia   . Seasonal allergies     Social History   Socioeconomic History  . Marital status: Married    Spouse name: Not on file  . Number of children: Not on file  . Years of education: Not on file  . Highest education level: Not on file  Occupational History  . Not on file  Tobacco Use  . Smoking status: Never Smoker  . Smokeless tobacco: Never Used  Substance and Sexual Activity  . Alcohol use: Yes    Alcohol/week: 0.0 standard drinks    Comment: Wine  . Drug use: No  . Sexual activity: Not on file  Other Topics Concern  . Not on file  Social History Narrative   Web designer    Married   Moved from [place] Strain:   . Difficulty of Paying Living Expenses: Not on file  Food Insecurity:   . Worried About Charity fundraiser in the Last Year: Not on file  . Ran Out of Food in the Last Year: Not on file  Transportation Needs:   . Lack of Transportation (Medical): Not on file  . Lack of Transportation (Non-Medical): Not on file  Physical Activity:   . Days of Exercise per Week: Not on file  . Minutes of Exercise per Session: Not on file  Stress:   . Feeling of Stress : Not on file  Social Connections:   . Frequency of Communication with Friends and Family:  Not on file  . Frequency of Social Gatherings with Friends and Family: Not on file  . Attends Religious Services: Not on file  . Active Member of Clubs or Organizations: Not on file  . Attends Archivist Meetings: Not on file  . Marital Status: Not on file  Intimate Partner Violence:   . Fear of Current or Ex-Partner: Not on file  . Emotionally Abused: Not on file  . Physically Abused: Not on file  . Sexually Abused: Not on file    Past Surgical History:  Procedure Laterality Date  . BREAST BIOPSY Right 1984, 1996   Benign  . lymph node resection  1998    Family History  Problem Relation  Age of Onset  . Cancer Mother        breast cancer  . Breast cancer Mother   . Heart disease Father   . Diabetes Father   . Heart failure Maternal Grandmother   . Stroke Paternal Grandfather     No Known Allergies  Current Outpatient Medications on File Prior to Visit  Medication Sig Dispense Refill  . levothyroxine (SYNTHROID) 50 MCG tablet Take 1 tablet (50 mcg total) by mouth daily before breakfast. 90 tablet 0   No current facility-administered medications on file prior to visit.    BP 120/82   Temp 97.8 F (36.6 C) (Temporal)   Ht 5\' 5"  (1.651 m)   Wt 132 lb (59.9 kg)   BMI 21.97 kg/m       Objective:   Physical Exam Vitals and nursing note reviewed.  Constitutional:      General: She is not in acute distress.    Appearance: Normal appearance. She is well-developed.  HENT:     Head: Normocephalic and atraumatic.     Right Ear: Tympanic membrane, ear canal and external ear normal. There is no impacted cerumen.     Left Ear: Tympanic membrane, ear canal and external ear normal. There is no impacted cerumen.     Nose: Nose normal. No congestion or rhinorrhea.     Mouth/Throat:     Mouth: Mucous membranes are moist.     Pharynx: Oropharynx is clear. No oropharyngeal exudate or posterior oropharyngeal erythema.  Eyes:     General:        Right eye: No discharge.        Left eye: No discharge.     Conjunctiva/sclera: Conjunctivae normal.     Pupils: Pupils are equal, round, and reactive to light.  Neck:     Thyroid: No thyromegaly.     Trachea: No tracheal deviation.  Cardiovascular:     Rate and Rhythm: Normal rate and regular rhythm.     Pulses: Normal pulses.     Heart sounds: Normal heart sounds. No murmur. No friction rub. No gallop.   Pulmonary:     Effort: Pulmonary effort is normal. No respiratory distress.     Breath sounds: Normal breath sounds. No stridor. No wheezing, rhonchi or rales.  Chest:     Chest wall: No tenderness.  Abdominal:      General: Bowel sounds are normal. There is no distension.     Palpations: Abdomen is soft. There is no mass.     Tenderness: There is no abdominal tenderness. There is no right CVA tenderness, left CVA tenderness, guarding or rebound.     Hernia: No hernia is present.  Musculoskeletal:        General: No swelling, tenderness, deformity or signs  of injury. Normal range of motion.     Cervical back: Normal range of motion and neck supple.     Right lower leg: No edema.     Left lower leg: No edema.  Lymphadenopathy:     Cervical: No cervical adenopathy.  Skin:    General: Skin is warm and dry.     Coloration: Skin is not jaundiced or pale.     Findings: No bruising, erythema, lesion or rash.  Neurological:     General: No focal deficit present.     Mental Status: She is alert and oriented to person, place, and time. Mental status is at baseline.     Cranial Nerves: No cranial nerve deficit.     Sensory: No sensory deficit.     Motor: No weakness.     Coordination: Coordination normal.     Gait: Gait normal.     Deep Tendon Reflexes: Reflexes normal.  Psychiatric:        Mood and Affect: Mood normal.        Behavior: Behavior normal.        Thought Content: Thought content normal.        Judgment: Judgment normal.       Assessment & Plan:  1. Hypothyroidism, unspecified type - Consider dose increase in synthroid - CBC with Differential/Platelet - CMP - Lipid panel - TSH  2. Pure hypercholesterolemia - Consider statin  - CBC with Differential/Platelet - CMP - Lipid panel - TSH   Dorothyann Peng, NP

## 2019-09-07 NOTE — Addendum Note (Signed)
Addended by: Miles Costain T on: 09/07/2019 03:33 PM   Modules accepted: Orders

## 2019-09-24 ENCOUNTER — Ambulatory Visit (INDEPENDENT_AMBULATORY_CARE_PROVIDER_SITE_OTHER): Payer: Medicare Other

## 2019-09-24 VITALS — Ht 65.0 in | Wt 132.0 lb

## 2019-09-24 DIAGNOSIS — Z13820 Encounter for screening for osteoporosis: Secondary | ICD-10-CM

## 2019-09-24 DIAGNOSIS — Z0000 Encounter for general adult medical examination without abnormal findings: Secondary | ICD-10-CM | POA: Diagnosis not present

## 2019-09-24 DIAGNOSIS — M810 Age-related osteoporosis without current pathological fracture: Secondary | ICD-10-CM

## 2019-09-24 NOTE — Progress Notes (Signed)
This visit is being conducted via phone call due to the COVID-19 pandemic. This patient has given me verbal consent via phone to conduct this visit, patient states they are participating from their home address. Some vital signs may be absent or patient reported.   Patient identification: identified by name, DOB, and current address.  Location provider: [facility], Office Persons participating in the virtual visit: Natasha Norman and Natasha Forts, LPN.   Subjective:   Natasha Norman is a 68 y.o. female who presents for an Initial Medicare Annual Wellness Visit.  Natasha Norman reports that she is regularly exercises for 45 minutes almost every day. She walks, uses exercise bike, yoga, and light weight lifting. She uses exercise DVD's at home. She also reported that she is eating healthy, well balanced meals. She expressed desire to decrease her total cholesterol.   Review of Systems     Cardiac Risk Factors include: advanced age (>60men, >66 women)     Objective:    Today's Vitals   09/24/19 0903  Weight: 132 lb (59.9 kg)  Height: 5\' 5"  (1.651 m)   Body mass index is 21.97 kg/m.  Advanced Directives 09/24/2019 02/02/2017  Does Patient Have a Medical Advance Directive? Yes Yes  Type of Advance Directive Living will [place];Living will  Does patient want to make changes to medical advance directive? No - Patient declined No - Patient declined  Copy of [place] in Chart? - Yes    Current Medications (verified) Outpatient Encounter Medications as of 09/24/2019  Medication Sig  . calcium carbonate (CALCIUM 600) 600 MG TABS tablet Take 600 mg by mouth 2 (two) times daily with a meal.  . levothyroxine (SYNTHROID) 50 MCG tablet Take 1 tablet (50 mcg total) by mouth daily before breakfast.  . Multiple Vitamin (MULTIVITAMIN) tablet Take 1 tablet by mouth daily.   No facility-administered encounter medications on file as of 09/24/2019.      Allergies (verified) Patient has no known allergies.   History: Past Medical History:  Diagnosis Date  . Cancer ([place]) 1996   Melanoma  . Chicken pox   . H/O repair of rotator cuff    Right   . Hypothyroidism   . Osteopenia   . Seasonal allergies    Past Surgical History:  Procedure Laterality Date  . BREAST BIOPSY Right 1984, 1996   Benign  . lymph node resection  1998   Family History  Problem Relation Age of Onset  . Cancer Mother        breast cancer  . Breast cancer Mother   . Heart disease Father   . Diabetes Father   . Heart failure Maternal Grandmother   . Stroke Paternal Grandfather    Social History   Socioeconomic History  . Marital status: Married    Spouse name: Not on file  . Number of children: 3  . Years of education: 4 years college  . Highest education level: Bachelor's degree (e.g., BA, AB, BS)  Occupational History  . Occupation: Retired  Tobacco Use  . Smoking status: Never Smoker  . Smokeless tobacco: Never Used  Substance and Sexual Activity  . Alcohol use: Yes    Alcohol/week: 1.0 standard drinks    Types: 1 Glasses of wine per week    Comment: Wine occasionally  . Drug use: No  . Sexual activity: Not on file  Other Topics Concern  . 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 Difficulty of Paying Living Expenses: Not on file  Food Insecurity: No Food Insecurity  . Worried About Charity fundraiser in the Last Year: Never true  . Ran Out of Food in the Last Year: Never true  Transportation Needs:   . Lack of Transportation (Medical): Not on file  . Lack of Transportation (Non-Medical): Not on file  Physical Activity:   . Days of Exercise per Week: Not on file  . Minutes of Exercise per Session: Not on file  Stress:   . Feeling of Stress : Not on file  Social  Connections:   . Frequency of Communication with Friends and Family: Not on file  . Frequency of Social Gatherings with Friends and Family: Not on file  . Attends Religious Services: Not on file  . Active Member of Clubs or Organizations: Not on file  . Attends Archivist Meetings: Not on file  . Marital Status: Not on file    Tobacco Counseling Counseling given: Not Answered   Clinical Intake:  Pre-visit preparation completed: Yes  Pain : No/denies pain     Nutritional Status: BMI of 19-24  Normal Nutritional Risks: Other (Comment) Diabetes: No  How often do you need to have someone help you when you read instructions, pamphlets, or other written materials from your doctor or pharmacy?: 1 - Never What is the last grade level you completed in school?: 4 years college  Interpreter Needed?: No  Information entered by :: Natasha Forts, LPN.   Activities of Daily Living In your present state of health, do you have any difficulty performing the following activities: 09/24/2019  Hearing? N  Vision? N  Difficulty concentrating or making decisions? N  Walking or climbing stairs? N  Dressing or bathing? N  Doing errands, shopping? N  Preparing Food and eating ? N  Using the Toilet? N  In the past six months, have you accidently leaked urine? N  Do you have problems with loss of bowel control? N  Managing your Medications? N  Managing your Finances? N  Housekeeping or managing your Housekeeping? N  Some recent data might be hidden     Immunizations and Health Maintenance Immunization History  Administered Date(s) Administered  . Influenza, High Dose Seasonal PF 06/12/2018  . Pneumococcal Conjugate-13 02/17/2018  . Pneumococcal Polysaccharide-23 09/07/2019  . Tdap 09/27/2009  . Zoster 09/27/2012   There are no preventive care reminders to display for this patient.  Patient Care Team: Dorothyann Peng, NP as PCP - General (Family Medicine)  Indicate  any recent Medical Services you may have received from other than [facility] providers in the past year (date may be approximate).     Assessment:   This is a routine wellness examination for Natasha Norman.  Hearing/Vision screen  Hearing Screening   125Hz  250Hz  500Hz  1000Hz  2000Hz  3000Hz  4000Hz  6000Hz  8000Hz   Right ear:           Left ear:           Comments: No problems hearing   Vision Screening Comments: Wears glasses; has not had eye exam this year. She generally goes every 2 years.  Dietary issues and exercise activities discussed: Current Exercise Habits: Home exercise routine, Type of exercise: stretching;treadmill;walking;yoga, Time (Minutes): 45, Frequency (Times/Week): 7, Weekly Exercise (Minutes/Week): 315, Intensity: Moderate,  Exercise limited by: None identified  Goals    . Patient Stated     Decrease cholesterol      Depression Screen PHQ 2/9 Scores 09/24/2019 07/11/2018 02/17/2018  PHQ - 2 Score 0 0 0    Fall Risk Fall Risk  09/24/2019 07/11/2018 02/17/2018  Falls in the past year? 0 No No    Is the patient's home free of loose throw rugs in walkways, pet beds, electrical cords, etc?   yes      Grab bars in the bathroom? yes      Handrails on the stairs?   yes      Adequate lighting?   yes  Timed Get Up and Go Performed N/A due to telephone visit.  Cognitive Function:     6CIT Screen 09/24/2019  What Year? 0 points  What month? 0 points  What time? 0 points  Count back from 20 0 points  Months in reverse 0 points  Repeat phrase 0 points  Total Score 0    Screening Tests Health Maintenance  Topic Date Due  . INFLUENZA VACCINE  12/26/2019 (Originally 04/28/2019)  . TETANUS/TDAP  09/28/2019  . MAMMOGRAM  05/13/2021  . COLONOSCOPY  09/27/2022  . DEXA SCAN  Completed  . Hepatitis C Screening  Completed  . PNA vac Low Risk Adult  Completed    Qualifies for Shingles Vaccine? Yes, she will check with her pharmacy to determine out of pocket costs and possibly  obtain there.   Cancer Screenings: Lung: Low Dose CT Chest recommended if Age 41-80 years, 30 pack-year currently smoking OR have quit w/in 15years. Patient does not qualify. Breast: Up to date on Mammogram? Yes   Up to date of Bone Density/Dexa? No; order sent to [facility]. Colorectal: yes  Additional Screenings:  Hepatitis C Screening: completed 02/22/2017.    Plan:   Natasha Norman will check with her pharmacy to determine her costs for updated Tdap and Shingrix vaccines. We discussed her trying to increase exercise intensity if possible. We also discussed the Mediterranean Diet with goal to decrease her total cholesterol.   I have personally reviewed and noted the following in the patient's chart:   . Medical and social history . Use of alcohol, tobacco or illicit drugs  . Current medications and supplements . Functional ability and status . Nutritional status . Physical activity . Advanced directives . List of other physicians . Hospitalizations, surgeries, and ER visits in previous 12 months . Vitals . Screenings to include cognitive, depression, and falls . Referrals and appointments  In addition, I have reviewed and discussed with patient certain preventive protocols, quality metrics, and best practice recommendations. A written personalized care plan for preventive services as well as general preventive health recommendations were provided to patient.     Natasha Forts, LPN   [id number]

## 2019-09-24 NOTE — Patient Instructions (Signed)
Natasha Norman , Thank you for taking time to participate in your Medicare Wellness Visit. I appreciate your ongoing commitment to your health goals. Please review the following plan we discussed and let me know if I can assist you in the future.   Screening recommendations/referrals: Colorectal Screening: last colonoscopy in 2014; due again in 2024. Up to date at this time. Mammogram: Last one 05/14/2019. Currently up to date. Bone Density: Last one 04/23/2016; due again now; order sent to [facility]. They will contact you to schedule.   Vision and Dental Exams: Recommended annual ophthalmology exams for early detection of glaucoma and other disorders of the eye Recommended annual dental exams for proper oral hygiene  Diabetic Exams: Diabetic Eye Exam: N/A Diabetic Foot Exam: N/A  Vaccinations: Influenza vaccine: patient reported in Sept 2020 at [facility]. Due again in Fall 2021. Pneumococcal vaccine: completed 09/07/2019. Up to date. Tdap vaccine: Last one in 2011. She will obtain in 2021. Shingles vaccine: Please contact your pharmacy to determine your out of pocket expense for the Shingrix vaccine. You may receive this vaccine at your local pharmacy. This is a series of two injections given 2-6 months apart.  Advanced directives:  These documents can be located in your chart.  Goals: Recommend to drink at least 6-8 8oz glasses of water per day. Eliminate fried foods from your diet, decrease sweets and foods that are high in carbs. Consider researching a mediterranean diet to help reduce cholesterol.   Continue to exercise for at least 150 minutes per week. Consider working to increase your intensity.   Recommend to remove any items from the home that may cause slips or trips.    Next appointment: Please schedule your Annual Wellness Visit with your Nurse Health Advisor in one year.  Preventive Care 36 Years and Older, Female Preventive care refers to lifestyle choices and  visits with your health care provider that can promote health and wellness. What does preventive care include?  A yearly physical exam. This is also called an annual well check.  Dental exams once or twice a year.  Routine eye exams. Ask your health care provider how often you should have your eyes checked.  Personal lifestyle choices, including:  Daily care of your teeth and gums.  Regular physical activity.  Eating a healthy diet.  Avoiding tobacco and drug use.  Limiting alcohol use.  Practicing safe sex.  Taking low-dose aspirin every day if recommended by your health care provider.  Taking vitamin and mineral supplements as recommended by your health care provider. What happens during an annual well check? The services and screenings done by your health care provider during your annual well check will depend on your age, overall health, lifestyle risk factors, and family history of disease. Counseling  Your health care provider may ask you questions about your:  Alcohol use.  Tobacco use.  Drug use.  Emotional well-being.  Home and relationship well-being.  Sexual activity.  Eating habits.  History of falls.  Memory and ability to understand (cognition).  Work and work Statistician.  Reproductive health. Screening  You may have the following tests or measurements:  Height, weight, and BMI.  Blood pressure.  Lipid and cholesterol levels. These may be checked every 5 years, or more frequently if you are over 88 years old.  Skin check.  Lung cancer screening. You may have this screening every year starting at age 30 if you have a 30-pack-year history of smoking and currently smoke or have  quit within the past 15 years.  Fecal occult blood test (FOBT) of the stool. You may have this test every year starting at age 69.  Flexible sigmoidoscopy or colonoscopy. You may have a sigmoidoscopy every 5 years or a colonoscopy every 10 years starting at age 46.   Hepatitis C blood test.  Hepatitis B blood test.  Sexually transmitted disease (STD) testing.  Diabetes screening. This is done by checking your blood sugar (glucose) after you have not eaten for a while (fasting). You may have this done every 1-3 years.  Bone density scan. This is done to screen for osteoporosis. You may have this done starting at age 45.  Mammogram. This may be done every 1-2 years. Talk to your health care provider about how often you should have regular mammograms. Talk with your health care provider about your test results, treatment options, and if necessary, the need for more tests. Vaccines  Your health care provider may recommend certain vaccines, such as:  Influenza vaccine. This is recommended every year.  Tetanus, diphtheria, and acellular pertussis (Tdap, Td) vaccine. You may need a Td booster every 10 years.  Zoster vaccine. You may need this after age 73.  Pneumococcal 13-valent conjugate (PCV13) vaccine. One dose is recommended after age 23.  Pneumococcal polysaccharide (PPSV23) vaccine. One dose is recommended after age 80. Talk to your health care provider about which screenings and vaccines you need and how often you need them. This information is not intended to replace advice given to you by your health care provider. Make sure you discuss any questions you have with your health care provider. Document Released: 10/10/2015 Document Revised: 06/02/2016 Document Reviewed: 07/15/2015 Elsevier Interactive Patient Education  2017 [employer] Prevention in the Home Falls can cause injuries. They can happen to people of all ages. There are many things you can do to make your home safe and to help prevent falls. What can I do on the outside of my home?  Regularly fix the edges of walkways and driveways and fix any cracks.  Remove anything that might make you trip as you walk through a door, such as a raised step or threshold.  Trim any  bushes or trees on the path to your home.  Use bright outdoor lighting.  Clear any walking paths of anything that might make someone trip, such as rocks or tools.  Regularly check to see if handrails are loose or broken. Make sure that both sides of any steps have handrails.  Any raised decks and porches should have guardrails on the edges.  Have any leaves, snow, or ice cleared regularly.  Use sand or salt on walking paths during winter.  Clean up any spills in your garage right away. This includes oil or grease spills. What can I do in the bathroom?  Use night lights.  Install grab bars by the toilet and in the tub and shower. Do not use towel bars as grab bars.  Use non-skid mats or decals in the tub or shower.  If you need to sit down in the shower, use a plastic, non-slip stool.  Keep the floor dry. Clean up any water that spills on the floor as soon as it happens.  Remove soap buildup in the tub or shower regularly.  Attach bath mats securely with double-sided non-slip rug tape.  Do not have throw rugs and other things on the floor that can make you trip. What can I do in the bedroom?  Use night lights.  Make sure that you have a light by your bed that is easy to reach.  Do not use any sheets or blankets that are too big for your bed. They should not hang down onto the floor.  Have a firm chair that has side arms. You can use this for support while you get dressed.  Do not have throw rugs and other things on the floor that can make you trip. What can I do in the kitchen?  Clean up any spills right away.  Avoid walking on wet floors.  Keep items that you use a lot in easy-to-reach places.  If you need to reach something above you, use a strong step stool that has a grab bar.  Keep electrical cords out of the way.  Do not use floor polish or wax that makes floors slippery. If you must use wax, use non-skid floor wax.  Do not have throw rugs and other  things on the floor that can make you trip. What can I do with my stairs?  Do not leave any items on the stairs.  Make sure that there are handrails on both sides of the stairs and use them. Fix handrails that are broken or loose. Make sure that handrails are as long as the stairways.  Check any carpeting to make sure that it is firmly attached to the stairs. Fix any carpet that is loose or worn.  Avoid having throw rugs at the top or bottom of the stairs. If you do have throw rugs, attach them to the floor with carpet tape.  Make sure that you have a light switch at the top of the stairs and the bottom of the stairs. If you do not have them, ask someone to add them for you. What else can I do to help prevent falls?  Wear shoes that:  Do not have high heels.  Have rubber bottoms.  Are comfortable and fit you well.  Are closed at the toe. Do not wear sandals.  If you use a stepladder:  Make sure that it is fully opened. Do not climb a closed stepladder.  Make sure that both sides of the stepladder are locked into place.  Ask someone to hold it for you, if possible.  Clearly mark and make sure that you can see:  Any grab bars or handrails.  First and last steps.  Where the edge of each step is.  Use tools that help you move around (mobility aids) if they are needed. These include:  Canes.  Walkers.  Scooters.  Crutches.  Turn on the lights when you go into a dark area. Replace any light bulbs as soon as they burn out.  Set up your furniture so you have a clear path. Avoid moving your furniture around.  If any of your floors are uneven, fix them.  If there are any pets around you, be aware of where they are.  Review your medicines with your doctor. Some medicines can make you feel dizzy. This can increase your chance of falling. Ask your doctor what other things that you can do to help prevent falls. This information is not intended to replace advice given to  you by your health care provider. Make sure you discuss any questions you have with your health care provider. Document Released: 07/10/2009 Document Revised: 02/19/2016 Document Reviewed: 10/18/2014 Elsevier Interactive Patient Education  2017 [employer].

## 2019-10-23 DIAGNOSIS — L814 Other melanin hyperpigmentation: Secondary | ICD-10-CM | POA: Diagnosis not present

## 2019-10-23 DIAGNOSIS — D225 Melanocytic nevi of trunk: Secondary | ICD-10-CM | POA: Diagnosis not present

## 2019-10-23 DIAGNOSIS — Z85820 Personal history of malignant melanoma of skin: Secondary | ICD-10-CM | POA: Diagnosis not present

## 2019-10-23 DIAGNOSIS — D1801 Hemangioma of skin and subcutaneous tissue: Secondary | ICD-10-CM | POA: Diagnosis not present

## 2019-10-23 DIAGNOSIS — L905 Scar conditions and fibrosis of skin: Secondary | ICD-10-CM | POA: Diagnosis not present

## 2019-10-23 DIAGNOSIS — D229 Melanocytic nevi, unspecified: Secondary | ICD-10-CM | POA: Diagnosis not present

## 2019-10-23 DIAGNOSIS — L821 Other seborrheic keratosis: Secondary | ICD-10-CM | POA: Diagnosis not present

## 2019-11-04 ENCOUNTER — Ambulatory Visit: Payer: Medicare Other | Attending: Internal Medicine

## 2019-11-04 DIAGNOSIS — Z23 Encounter for immunization: Secondary | ICD-10-CM

## 2019-11-04 NOTE — Progress Notes (Signed)
   Covid-19 Vaccination Clinic  Name:  Natasha Norman    MRN: [id number] DOB: April 12, 1951  11/04/2019  [name] was observed post Covid-19 immunization for 15 minutes without incidence. She was provided with Vaccine Information Sheet and instruction to access the V-Safe system.   [name] was instructed to call 911 with any severe reactions post vaccine: Marland Kitchen Difficulty breathing  . Swelling of your face and throat  . A fast heartbeat  . A bad rash all over your body  . Dizziness and weakness    Immunizations Administered    Name Date Dose VIS Date Route   Pfizer COVID-19 Vaccine 11/04/2019  2:16 PM 0.3 mL 09/07/2019 Intramuscular   Manufacturer: [place]   Lot: [id number]   [place]: [id number]

## 2019-11-21 ENCOUNTER — Ambulatory Visit: Payer: Medicare Other

## 2019-11-28 ENCOUNTER — Ambulatory Visit: Payer: Medicare Other

## 2019-11-28 ENCOUNTER — Ambulatory Visit: Payer: Medicare Other | Attending: Internal Medicine

## 2019-11-28 DIAGNOSIS — Z23 Encounter for immunization: Secondary | ICD-10-CM

## 2019-11-28 NOTE — Progress Notes (Signed)
   Covid-19 Vaccination Clinic  Name:  Natasha Norman    MRN: [id number] DOB: [birth year]  11/28/2019  [name] was observed post Covid-19 immunization for 15 minutes without incident. She was provided with Vaccine Information Sheet and instruction to access the V-Safe system.   [name] was instructed to call 911 with any severe reactions post vaccine: Marland Kitchen Difficulty breathing  . Swelling of face and throat  . A fast heartbeat  . A bad rash all over body  . Dizziness and weakness   Immunizations Administered    Name Date Dose VIS Date Route   Pfizer COVID-19 Vaccine 11/28/2019 11:02 AM 0.3 mL 09/07/2019 Intramuscular   Manufacturer: [place]   Lot: [id number]   [place]: [id number]

## 2019-12-11 ENCOUNTER — Ambulatory Visit
Admission: RE | Admit: 2019-12-11 | Discharge: 2019-12-11 | Disposition: A | Discharge status: Home / Self Care | Payer: Medicare Other | Source: Ambulatory Visit | Attending: Adult Health | Admitting: Adult Health

## 2019-12-11 ENCOUNTER — Encounter: Payer: Self-pay | Admitting: Adult Health

## 2019-12-11 ENCOUNTER — Other Ambulatory Visit: Payer: Self-pay

## 2019-12-11 DIAGNOSIS — Z780 Asymptomatic menopausal state: Secondary | ICD-10-CM | POA: Diagnosis not present

## 2019-12-11 DIAGNOSIS — M810 Age-related osteoporosis without current pathological fracture: Secondary | ICD-10-CM

## 2019-12-11 DIAGNOSIS — M8589 Other specified disorders of bone density and structure, multiple sites: Secondary | ICD-10-CM | POA: Diagnosis not present

## 2019-12-13 ENCOUNTER — Other Ambulatory Visit: Payer: Self-pay

## 2019-12-14 ENCOUNTER — Telehealth: Payer: Self-pay | Admitting: Adult Health

## 2019-12-14 ENCOUNTER — Encounter: Payer: Self-pay | Admitting: Adult Health

## 2019-12-14 ENCOUNTER — Ambulatory Visit (INDEPENDENT_AMBULATORY_CARE_PROVIDER_SITE_OTHER): Payer: Medicare Other | Admitting: Adult Health

## 2019-12-14 ENCOUNTER — Ambulatory Visit (INDEPENDENT_AMBULATORY_CARE_PROVIDER_SITE_OTHER): Payer: Medicare Other

## 2019-12-14 VITALS — BP 120/82 | Temp 97.3°F | Wt 132.0 lb

## 2019-12-14 DIAGNOSIS — M25552 Pain in left hip: Secondary | ICD-10-CM | POA: Diagnosis not present

## 2019-12-14 IMAGING — DX DG HIP (WITH OR WITHOUT PELVIS) 2-3V*L*
2 series · 2 of 2 positions shown · non-contrast
Comparison: None.

CLINICAL DATA: Left hip pain for 3 months with no known injury.

EXAM:
DG HIP (WITH OR WITHOUT PELVIS) 2-3V LEFT

[pelvis ap]
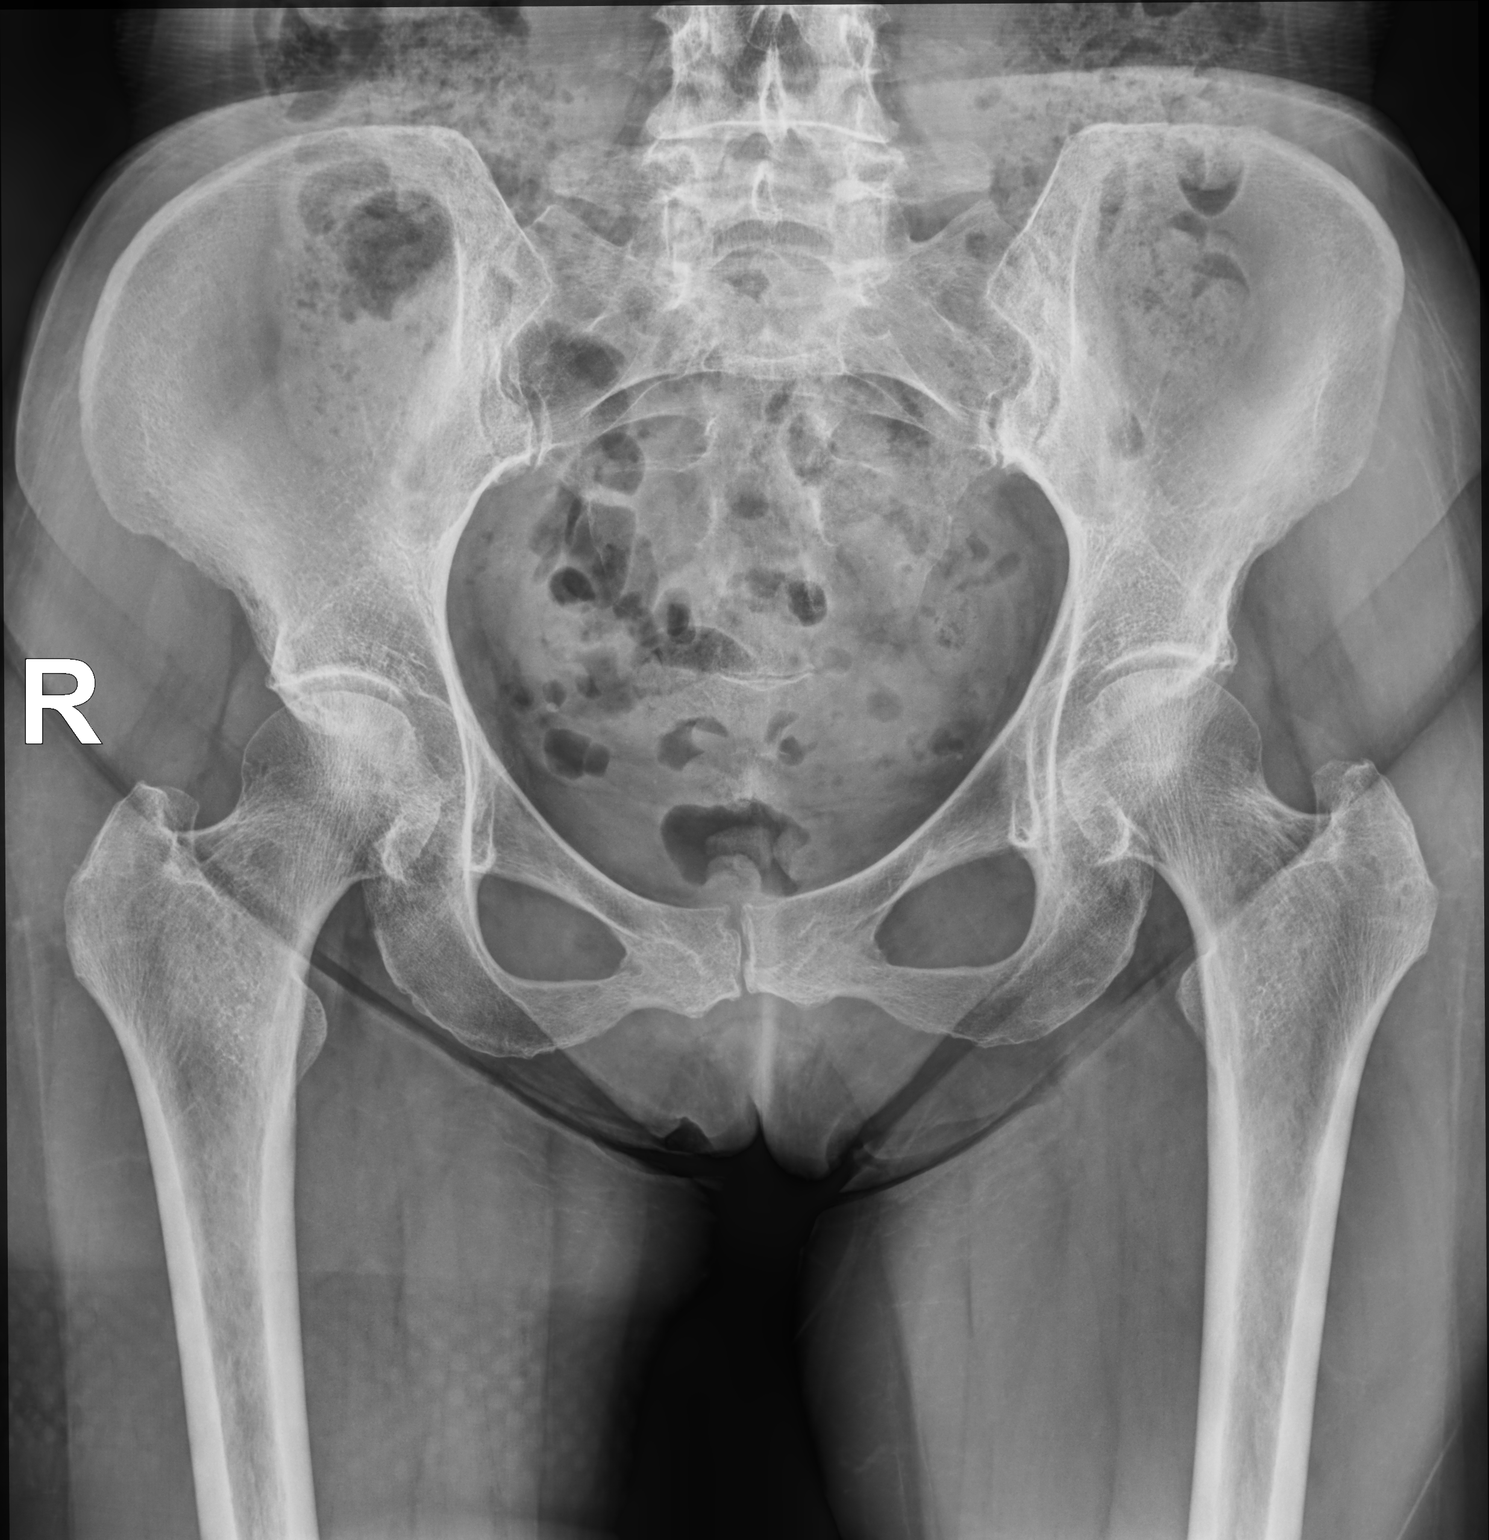

[hip (frog leg) lat]
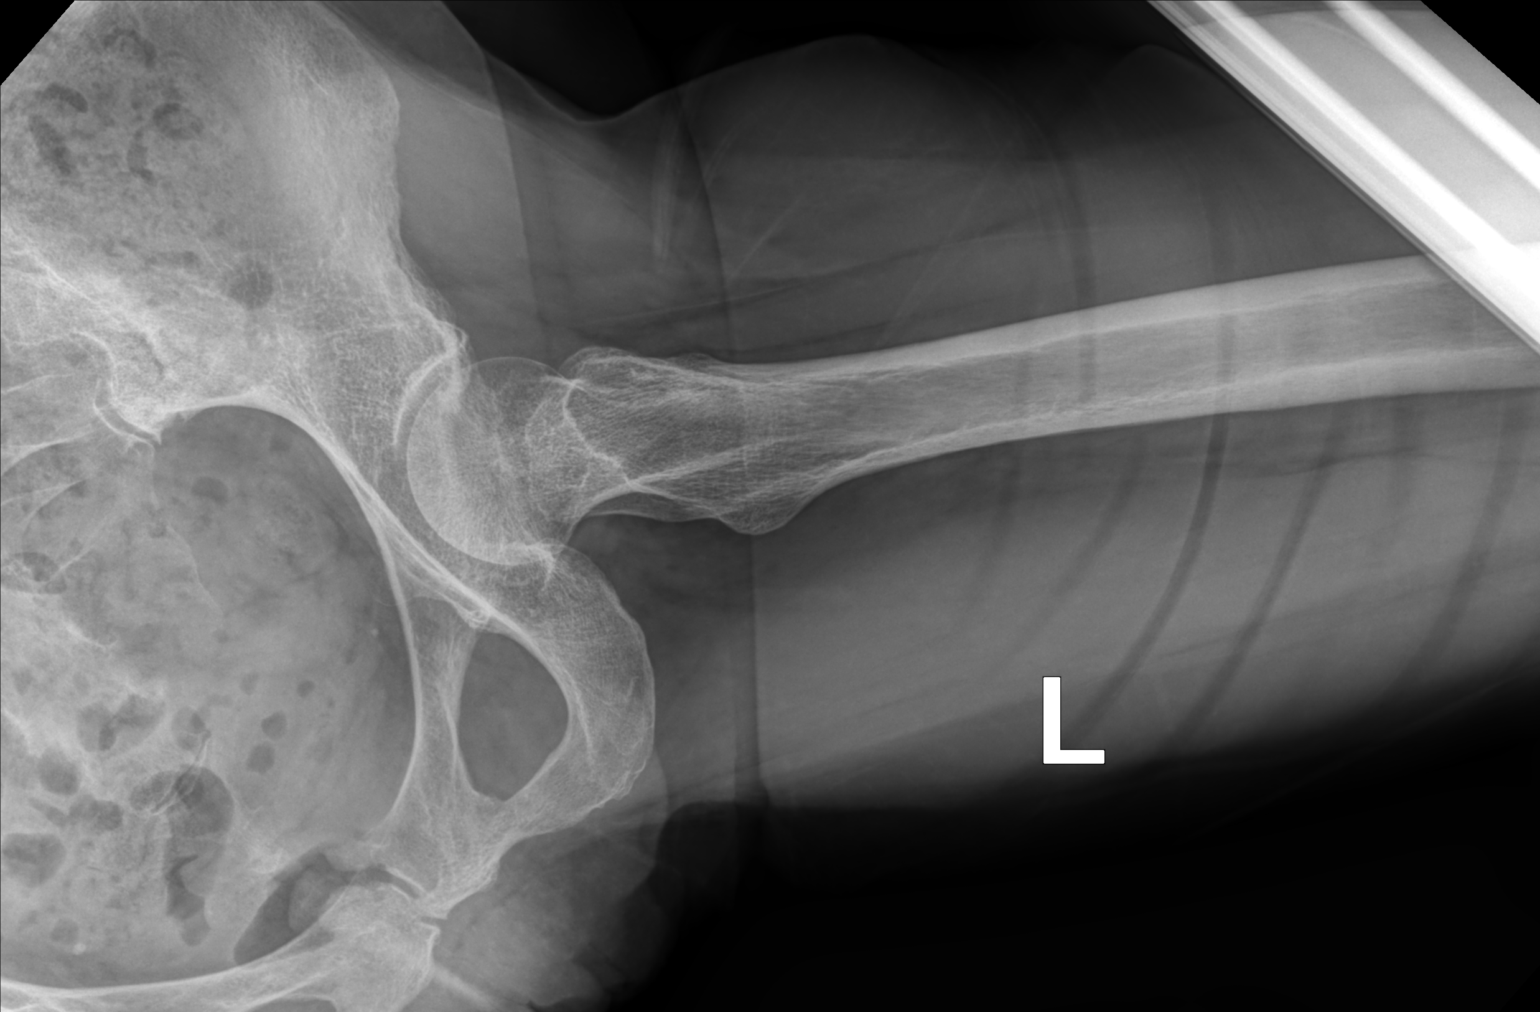

[2 of 2 positions shown; findings below may reference images not displayed]

FINDINGS: No fracture or bone lesion.

Hip joints, SI joints and symphysis pubis are normally spaced and
aligned. No arthropathic changes.

Normal soft tissues.
IMPRESSION: Negative.

## 2019-12-14 NOTE — Progress Notes (Signed)
Subjective:    Patient ID: Natasha Norman, female    DOB: 03-Jun-1951, 69 y.o.   MRN: [id number]  HPI 69 year old female who  has a past medical history of Cancer ([place]) (1996), Chicken pox, H/O repair of rotator cuff, Hypothyroidism, Osteopenia, and Seasonal allergies.  She presents to the office today for intermittent left hip pain x3 months.  Reports that over the last 3 months she has had waxing and waning episodes of left hip pain.  The pain has woken her up at night when she is sleeping on her right hip, when this happens she will switch over to the sleeping on her left hip and this helps resolve the pain.  Pain has been better over the last 2 days but she continues to have mild discomfort.  He has tried stretching, ice, and heat and has not had much relief from these modalities.  He is unable to describe the pain but reports its "not stabbing pain".  She is very active and walks almost every day.  Review of Systems See HPI   Past Medical History:  Diagnosis Date  . Cancer ([place]) 1996   Melanoma  . Chicken pox   . H/O repair of rotator cuff    Right   . Hypothyroidism   . Osteopenia   . Seasonal allergies     Social History   Socioeconomic History  . Marital status: Married    Spouse name: Not on file  . Number of children: 3  . Years of education: 4 years college  . Highest education level: Bachelor's degree (e.g., BA, AB, BS)  Occupational History  . Occupation: Retired  Tobacco Use  . Smoking status: Never Smoker  . Smokeless tobacco: Never Used  Substance and Sexual Activity  . Alcohol use: Yes    Alcohol/week: 1.0 standard drinks    Types: 1 Glasses of wine per week    Comment: Wine occasionally  . Drug use: No  . Sexual activity: Not on file  Other Topics Concern  . Not on file  Social History Narrative   Retired Geologist, engineering   Married + Animal nutritionist from [place]    3 children   Social Determinants of Systems developer Strain:   . Difficulty of Paying Living Expenses:   Food Insecurity: No Food Insecurity  . Worried About Charity fundraiser in the Last Year: Never true  . Ran Out of Food in the Last Year: Never true  Transportation Needs:   . Lack of Transportation (Medical):   Marland Kitchen Lack of Transportation (Non-Medical):   Physical Activity: Sufficiently Active  . Days of Exercise per Week: 7 days  . Minutes of Exercise per Session: 40 min  Stress:   . Feeling of Stress :   Social Connections:   . Frequency of Communication with Friends and Family:   . Frequency of Social Gatherings with Friends and Family:   . Attends Religious Services:   . Active Member of Clubs or Organizations:   . Attends Archivist Meetings:   Marland Kitchen Marital Status:   Intimate Partner Violence:   . Fear of Current or Ex-Partner:   . Emotionally Abused:   Marland Kitchen Physically Abused:   . Sexually Abused:     Past Surgical History:  Procedure Laterality Date  . BREAST BIOPSY Right 1984, 1996   Benign  . lymph node resection  1998    Family History  Problem Relation  Age of Onset  . Cancer Mother        breast cancer  . Breast cancer Mother   . Heart disease Father   . Diabetes Father   . Heart failure Maternal Grandmother   . Stroke Paternal Grandfather     No Known Allergies  Current Outpatient Medications on File Prior to Visit  Medication Sig Dispense Refill  . calcium carbonate (CALCIUM 600) 600 MG TABS tablet Take 600 mg by mouth 2 (two) times daily with a meal.    . GLUCOSAMINE-CHONDROITIN PO Take by mouth.    . levothyroxine (SYNTHROID) 50 MCG tablet Take 1 tablet (50 mcg total) by mouth daily before breakfast. 90 tablet 3  . Multiple Vitamin (MULTIVITAMIN) tablet Take 1 tablet by mouth daily.     No current facility-administered medications on file prior to visit.    BP 120/82   Temp (!) 97.3 F (36.3 C)   Wt 132 lb (59.9 kg)   BMI 21.97 kg/m       Objective:   Physical Exam Vitals  and nursing note reviewed.  Constitutional:      Appearance: Normal appearance.  Musculoskeletal:        General: Tenderness present. No deformity. Normal range of motion.     Left hip: Tenderness present. No bony tenderness or crepitus. Normal range of motion. Normal strength.     Right lower leg: No edema.     Left lower leg: No edema.     Comments: He did have discomfort with straight leg raise as well as external rotation.  Was able to reproduce pain with deep palpation where the IT band connects to the iliac crest as well as mild tenderness with palpation elsewhere along the IT band.  Skin:    General: Skin is warm and dry.     Capillary Refill: Capillary refill takes less than 2 seconds.  Neurological:     General: No focal deficit present.     Mental Status: She is alert and oriented to person, place, and time.  Psychiatric:        Mood and Affect: Mood normal.        Behavior: Behavior normal.        Thought Content: Thought content normal.        Judgment: Judgment normal.       Assessment & Plan:  1. Left hip pain -IT band syndrome vs muscle strain vs bursitis vs labral tear  -Check x-ray of left hip to rule out helpful abnormalities.  Likely advise stretching exercises and Motrin.  Can consider further imaging or referral to sports medicine in the future - DG Hip Unilat W OR W/O Pelvis 2-3 Views Left; Future - DG Hip Unilat W OR W/O Pelvis 2-3 Views Left   Dorothyann Peng, NP

## 2019-12-14 NOTE — Telephone Encounter (Signed)
Spoke to patient and informed her of her x-ray of the left hip.  There was no abnormalities noted.   I believe her pain is coming from IT band syndrome.  She was advised to stretch, rest, and take anti-inflammatories every 8 hours for the next 10 days.  If no improvement then she will follow-up and we can refer to sports medicine for further treatment

## 2020-01-04 ENCOUNTER — Other Ambulatory Visit: Payer: Self-pay | Admitting: Adult Health

## 2020-01-04 ENCOUNTER — Encounter: Payer: Self-pay | Admitting: Adult Health

## 2020-01-04 DIAGNOSIS — M25552 Pain in left hip: Secondary | ICD-10-CM

## 2020-01-09 DIAGNOSIS — H5213 Myopia, bilateral: Secondary | ICD-10-CM | POA: Diagnosis not present

## 2020-01-09 DIAGNOSIS — H2513 Age-related nuclear cataract, bilateral: Secondary | ICD-10-CM | POA: Diagnosis not present

## 2020-01-10 ENCOUNTER — Ambulatory Visit: Payer: Self-pay

## 2020-01-10 ENCOUNTER — Ambulatory Visit (INDEPENDENT_AMBULATORY_CARE_PROVIDER_SITE_OTHER): Payer: Medicare Other | Admitting: Family Medicine

## 2020-01-10 ENCOUNTER — Other Ambulatory Visit: Payer: Self-pay

## 2020-01-10 ENCOUNTER — Encounter: Payer: Self-pay | Admitting: Family Medicine

## 2020-01-10 VITALS — BP 128/78 | HR 85 | Ht 65.0 in | Wt 132.2 lb

## 2020-01-10 DIAGNOSIS — M25552 Pain in left hip: Secondary | ICD-10-CM | POA: Diagnosis not present

## 2020-01-10 DIAGNOSIS — M7062 Trochanteric bursitis, left hip: Secondary | ICD-10-CM | POA: Diagnosis not present

## 2020-01-10 NOTE — Progress Notes (Signed)
Subjective:    CC: L lateral hip pain  I, Natasha Norman, LAT, ATC, am serving as scribe for Dr. [name].  HPI: Pt is a 69 y/o female presenting w/ c/o L lateral hip pain x 3 months w/ no known MOI.  She locates her pain to her L lateral hip.  She rates her pain as mild currently and moderate at it's worst and describes her pain as aching.  Pain with walking and activity.  She does have some pain at bedtime.  She notes that the pain is improving over the last few weeks spontaneously.  She has been doing some stretching on her own which has helped a little.  Radiating pain: intermittently into her L lateral thigh and into her L groin L hip mechanical symptoms: No Aggravating factors: Sleeping on the non-involved side; transitioning from sit-to-stand; prolonged sitting/driving; prolonged walking Treatments tried: stretching, ice, heat, Advil, Aleve  Diagnostic imaging: L hip XR- 12/14/19  Pertinent review of Systems: No fevers or chills  Relevant historical information: Hypothyroidism on levothyroxine   Objective:    Vitals:   01/10/20 1121  BP: 128/78  Pulse: 85  SpO2: 99%   General: Well Developed, well nourished, and in no acute distress.   MSK: Left hip normal-appearing Normal motion. Mildly tender palpation lateral hip overlying greater trochanter. Hip abduction strength diminished 4/5 with some pain. External rotation strength diminished 4/5 with some pain. Internal rotation and adduction strength intact without pain. Negative FABER test and FADIR test  Right hip normal-appearing nontender normal motion. Strength 4/5 abduction.  4+/5 external rotation.  Both without pain. Internal rotation and adduction strength intact.  Leg lengths equal   Lab and Radiology Results EXAM: DG HIP (WITH OR WITHOUT PELVIS) 2-3V LEFT  COMPARISON:  None.  FINDINGS: No fracture or bone lesion.  Hip joints, SI joints and symphysis pubis are normally spaced  and aligned. No arthropathic changes.  Normal soft tissues.  IMPRESSION: Negative.   Electronically Signed   By: Lajean Manes M.D.   On: 12/14/2019 11:09 I, [name], personally (independently) visualized and performed the interpretation of the images attached in this note.     Impression and Recommendations:    Assessment and Plan: 69 y.o. female with left lateral hip pain ongoing for about 3 months.  Pain was quite bothersome until recently.  Pain is now somewhat improved but still interfering with her quality of life with sleep standing from a seated position and with normal walking activity.  Her goals are to be able to walk several miles without significant pain. Plan for home exercise program as taught in clinic today by ATC and referral to physical therapy.  Voltaren gel reasonable to try although I do not expect it to be present effective for pain in this region. Recheck 4 to 6 weeks.  If not better consider injection versus MRI.  Doubtful will need further interventions for this expect great resolution with exercises of physical therapy.   Orders Placed This Encounter  Procedures  . Ambulatory referral to Physical Therapy    Referral Priority:   Routine    Referral Type:   Physical Medicine    Referral Reason:   Specialty Services Required    Requested Specialty:   Physical Therapy   No orders of the defined types were placed in this encounter.   Discussed warning signs or symptoms. Please see discharge instructions. Patient expresses understanding.   The above documentation has been reviewed and is accurate  and complete [name]

## 2020-01-10 NOTE — Patient Instructions (Addendum)
Thank you for coming in today.  Attend PT.  Do the exercises we reviewed.  Recheck in 6 weeks.  Contact me or return sooner if needed. Try using voltaren gel on the hip up to 4x daily.   Trochanteric Bursitis or Hip Abductor Tendonitis.    Please perform the exercise program that we have prepared for you and gone over in detail on a daily basis.  In addition to the handout you were provided you can access your program through: [email or web address]   Your unique program code is: [email or web address]

## 2020-01-18 ENCOUNTER — Encounter: Payer: Self-pay | Admitting: Family Medicine

## 2020-01-18 DIAGNOSIS — M7062 Trochanteric bursitis, left hip: Secondary | ICD-10-CM

## 2020-01-18 DIAGNOSIS — M25552 Pain in left hip: Secondary | ICD-10-CM

## 2020-01-24 ENCOUNTER — Ambulatory Visit: Payer: Medicare Other | Attending: Family Medicine | Admitting: Physical Therapy

## 2020-01-24 ENCOUNTER — Other Ambulatory Visit: Payer: Self-pay

## 2020-01-24 DIAGNOSIS — M6281 Muscle weakness (generalized): Secondary | ICD-10-CM

## 2020-01-24 DIAGNOSIS — M25552 Pain in left hip: Secondary | ICD-10-CM | POA: Insufficient documentation

## 2020-01-24 DIAGNOSIS — R252 Cramp and spasm: Secondary | ICD-10-CM | POA: Insufficient documentation

## 2020-01-24 NOTE — Patient Instructions (Signed)

## 2020-01-24 NOTE — Therapy (Signed)
[facility] [address] [place], [place], [address] Phone: [phone]   Fax:  [phone]  Physical Therapy Evaluation  Patient Details  Name: Natasha Norman MRN: [id number] Date of Birth: 12-Dec-1950 Referring Provider (PT): Dr. [name]   Encounter Date: 01/24/2020  PT End of Session - 01/24/20 1131    Visit Number  1    Date for PT Re-Evaluation  03/20/20    Authorization Type  Medicare 10th visit progress note    PT Start Time  1021    PT Stop Time  1105    PT Time Calculation (min)  44 min    Activity Tolerance  Patient tolerated treatment well       Past Medical History:  Diagnosis Date  . Cancer ([place]) 1996   Melanoma  . Chicken pox   . H/O repair of rotator cuff    Right   . Hypothyroidism   . Osteopenia   . Seasonal allergies     Past Surgical History:  Procedure Laterality Date  . BREAST BIOPSY Right 1984, 1996   Benign  . lymph node resection  1998    There were no vitals filed for this visit.   Subjective Assessment - 01/24/20 1023    Subjective  Years ago had bursitis, resolved with stretches;  In December, started hurting.  Worsened after stretching.  Tried 2 weeks of [place] and ice. Feb and March was painful.  Better but not gone.    Pertinent History  right knee pain; osteopenia    Limitations  Walking;House hold activities    How long can you walk comfortably?  45 minutes and it hurts immediately and the whole time.    Diagnostic tests  x-ray no arthritis    Patient Stated Goals  stop hurting so I can walk on vacation    Currently in Pain?  Yes    Pain Score  1     Pain Location  Hip    Pain Orientation  Left;Lateral    Pain Type  Chronic pain    Aggravating Factors   right sidelying; walking; standing with weight on one leg; sit to stand    Pain Relieving Factors  sitting OK         [facility] PT Assessment - 01/24/20 0001      Assessment   Medical Diagnosis  left hip  pain    Referring Provider (PT)  Dr. [name]    Onset Date/Surgical Date  --   December   Next MD Visit  02/18/20    Prior Therapy  shoulder PT 7-8 years ago      Precautions   Precautions  None      Restrictions   Weight Bearing Restrictions  No      Balance Screen   Has the patient fallen in the past 6 months  No    Has the patient had a decrease in activity level because of a fear of falling?   No    Is the patient reluctant to leave their home because of a fear of falling?   No      Home Environment   Living Environment  Private residence    Living Arrangements  Spouse/significant other    Type of [place] to enter    [place]  One level      Prior Function   Level of [place]  Vocation  Retired    Leisure  traveling; walking; reading, crochet; learning to knit       AROM   Overall AROM Comments  full Lumbar ROM;  full hip ROM bil no pain except at endrange left hip external rotation       Strength   Right Hip Flexion  5/5    Right Hip Extension  5/5    Right Hip External Rotation   5/5    Right Hip ABduction  5/5    Left Hip Flexion  5/5    Left Hip Extension  4+/5    Left Hip External Rotation  4+/5    Left Hip ABduction  4/5      Flexibility   Hamstrings  WNLs    Quadriceps  mild dec left hip flexor length    ITB  decreased       Palpation   SI assessment   point tender over left hip greater trochanter     Palpation comment  Numerous tender points and sensitivity in left gluteals, piriformis, lateral quads, lateral HS and ITB      Special Tests   Other special tests  painful with left sidelying       Trendelenburg Test   Comments  left pelvic drop with SLS      Hip Scouring   Findings  Negative    Side  Left                Objective measurements completed on examination: See above findings.      [facility] Adult PT Treatment/Exercise - 01/24/20 0001      Self-Care   ADL's  discussed  avoidance of left sidelying for sleep      Iontophoresis   Type of Iontophoresis  Dexamethasone    Location  left hip greater trochanter     Dose  4 mg/ml    Time  4-6 hour patch              PT Education - 01/24/20 1130    Education Details  info on iontophoresis;  info about dry needling    Person(s) Educated  Patient    Methods  Explanation;Handout    Comprehension  Verbalized understanding       PT Short Term Goals - 01/24/20 1727      PT SHORT TERM GOAL #1   Title  The patient will demonstrate knowledge of basic self care strategies and initial HEP    Time  4    Period  Weeks    Status  New    Target Date  02/21/20      PT SHORT TERM GOAL #2   Title  The patient will report a 30% decrease in left hip pain with walking 45 minutes and with rising sit to stand    Time  4    Period  Weeks    Status  New        PT Long Term Goals - 01/24/20 1728      PT LONG TERM GOAL #1   Title  The patient will be independent in safe self progression of HEP    Time  8    Period  Weeks    Status  New    Target Date  03/20/20      PT LONG TERM GOAL #2   Title  The patient will report a 60% decreased in left hip pain with walking 45 minutes and with rising sit to stand  Time  8    Period  Weeks    Status  New      PT LONG TERM GOAL #3   Title  The patient will have 4+/5 to 5-/5  left hip abduction strength needed to eliminate pelvic drop with single limb stance or prolonged standing    Time  8    Period  Weeks    Status  New      PT LONG TERM GOAL #4   Title  FOTO functional outcome score improved from 33% to 22% indicating improved function with less pain    Time  8    Period  Weeks    Status  New             Plan - 01/24/20 1131    Clinical Impression Statement  The patient reports the onset of left lateral hip pain in December for no apparent reason.  Pain is aggravated with walking, sit to stand, sidelying on either side and standing with weight on  her left side.  She reports she looked up some stretches and that really seemed to aggravate her symptoms is February and March.  She continues to walk 45 mintues a day but has moderate pain the entire time.  She has full left hip ROM with pain only at endrange hip external rotation.  Mild reduction of hip flexor muscle length on left.  Decreased left hip abduction strength 4/5 with pelvic drop noted with single leg standing.  Hip extension and external rotation 4+/5.  Point tender directly over trochanteric bursa but also has tender points in gluteals, piriformis, lateral quads, lateral HS and along ITB.  She would benefit from skilled PT to address these deficits.    Personal Factors and Comorbidities  Comorbidity 1    Comorbidities  osteopenia    Examination-Activity Limitations  Locomotion Level;Transfers    Examination-Participation Restrictions  Community Activity;Meal Prep;Cleaning    Stability/Clinical Decision Making  Stable/Uncomplicated    Clinical Decision Making  Low    Rehab Potential  Good    PT Frequency  2x / week    PT Duration  8 weeks    PT Treatment/Interventions  ADLs/Self Care Home Management;Iontophoresis 4mg /ml Dexamethasone;Cryotherapy;Electrical Stimulation;Ultrasound;Moist Heat;Therapeutic exercise;Therapeutic activities;Neuromuscular re-education;Manual techniques;Dry needling;Taping    PT Next Visit Plan  assess response to ionto #1 to left greater trochanter;  patient considering DN to left gluteals, lateral quads, lateral HS and ITB; start [place]  pt has HEP from MD office ITB stretch, bridges and sidelying hip abduction    Consulted and Agree with Plan of Care  Patient       Patient will benefit from skilled therapeutic intervention in order to improve the following deficits and impairments:  Increased fascial restricitons, Increased muscle spasms, Pain, Decreased strength  Visit Diagnosis: Pain in left hip - Plan: PT plan of care  cert/re-cert  Muscle weakness (generalized) - Plan: PT plan of care cert/re-cert  Cramp and spasm - Plan: PT plan of care cert/re-cert     Problem List Patient Active Problem List   Diagnosis Date Noted  . Trochanteric bursitis, left hip 01/10/2020  . Hypothyroid 02/12/2016   Ruben Im, PT 01/24/20 5:34 PM Phone: [phone] Fax: [phone] Alvera Singh 01/24/2020, 5:33 PM  [facility] [address], [place], [place], [address] Phone: [phone]   Fax:  [phone]  Name: Natasha Norman MRN: [id number] Date of Birth: 12-26-1950

## 2020-01-29 ENCOUNTER — Encounter: Payer: Self-pay | Admitting: Physical Therapy

## 2020-01-29 ENCOUNTER — Ambulatory Visit: Payer: Medicare Other | Attending: Family Medicine | Admitting: Physical Therapy

## 2020-01-29 ENCOUNTER — Other Ambulatory Visit: Payer: Self-pay

## 2020-01-29 DIAGNOSIS — M6281 Muscle weakness (generalized): Secondary | ICD-10-CM | POA: Insufficient documentation

## 2020-01-29 DIAGNOSIS — R252 Cramp and spasm: Secondary | ICD-10-CM | POA: Insufficient documentation

## 2020-01-29 DIAGNOSIS — M25552 Pain in left hip: Secondary | ICD-10-CM | POA: Insufficient documentation

## 2020-01-29 NOTE — Therapy (Signed)
[facility] [address] [place], [place], [address] Phone: [phone]   Fax:  [phone]  Physical Therapy Treatment  Patient Details  Name: Caty Tessler MRN: [id number] Date of Birth: 1950/11/07 Referring Provider (PT): Dr. [name]   Encounter Date: 01/29/2020  PT End of Session - 01/29/20 1019    Visit Number  2    Date for PT Re-Evaluation  03/20/20    Authorization Type  Medicare 10th visit progress note    PT Start Time  1019    PT Stop Time  1103    PT Time Calculation (min)  44 min    Activity Tolerance  Patient tolerated treatment well    Behavior During Therapy  [facility] for tasks assessed/performed       Past Medical History:  Diagnosis Date  . Cancer ([place]) 1996   Melanoma  . Chicken pox   . H/O repair of rotator cuff    Right   . Hypothyroidism   . Osteopenia   . Seasonal allergies     Past Surgical History:  Procedure Laterality Date  . BREAST BIOPSY Right 1984, 1996   Benign  . lymph node resection  1998    There were no vitals filed for this visit.  Subjective Assessment - 01/29/20 1021    Subjective  About 24 hours after the patch it felt better, but then 24 hours after that it was back.    How long can you walk comfortably?  45 minutes and it hurts immediately and the whole time.    Diagnostic tests  x-ray no arthritis    Patient Stated Goals  stop hurting so I can walk on vacation    Currently in Pain?  Yes    Pain Score  1     Pain Location  Hip    Pain Orientation  Left;Lateral                       [facility] Adult PT Treatment/Exercise - 01/29/20 0001      Exercises   Exercises  Knee/Hip      Knee/Hip Exercises: Stretches   Active Hamstring Stretch  Left;2 reps;30 seconds    Active Hamstring Stretch Limitations  with strap    Hip Flexor Stretch  Left;2 reps;30 seconds    Hip Flexor Stretch Limitations  1/2 kneel on chair; also tried in standing    ITB Stretch  Left;2 reps;30 seconds    ITB Stretch Limitations  some pain in left groin    Piriformis Stretch  Left;1 rep;20 seconds    Piriformis Stretch Limitations  current stretch reviewed     Other Knee/Hip Stretches  hip ADD stretch with strap 2x30 and 1 in standing x 30      Knee/Hip Exercises: Aerobic   Stationary Bike  L2 x 4 min      Modalities   Modalities  Iontophoresis      Iontophoresis   Type of Iontophoresis  Dexamethasone   #2   Location  left hip greater trochanter     Dose  4 mg/ml    Time  4-6 hour patch       Manual Therapy   Manual Therapy  Soft tissue mobilization;Myofascial release    Manual therapy comments  skilled palpation and monitoring of soft tissues during DN    Soft tissue mobilization  to left gluteals and lateral quad    Myofascial Release  to left ITB  Trigger Point Dry Needling - 01/29/20 0001    Consent Given?  Yes    Education Handout Provided  Yes    Muscles Treated Lower Quadrant  Vastus lateralis    Muscles Treated Back/Hip  Gluteus minimus;Gluteus medius;Gluteus maximus;Piriformis    Dry Needling Comments  left    Vastus lateralis Response  Twitch response elicited;Palpable increased muscle length    Gluteus Minimus Response  Twitch response elicited;Palpable increased muscle length    Gluteus Medius Response  Twitch response elicited;Palpable increased muscle length    Gluteus Maximus Response  Twitch response elicited;Palpable increased muscle length    Piriformis Response  Twitch response elicited;Palpable increased muscle length           PT Education - 01/29/20 1623    Education Details  HEP; DN education and aftercare    Person(s) Educated  Patient    Methods  Explanation;Demonstration;Handout    Comprehension  Verbalized understanding;Returned demonstration       PT Short Term Goals - 01/24/20 1727      PT SHORT TERM GOAL #1   Title  The patient will demonstrate knowledge of basic self care strategies and  initial HEP    Time  4    Period  Weeks    Status  New    Target Date  02/21/20      PT SHORT TERM GOAL #2   Title  The patient will report a 30% decrease in left hip pain with walking 45 minutes and with rising sit to stand    Time  4    Period  Weeks    Status  New        PT Long Term Goals - 01/24/20 1728      PT LONG TERM GOAL #1   Title  The patient will be independent in safe self progression of HEP    Time  8    Period  Weeks    Status  New    Target Date  03/20/20      PT LONG TERM GOAL #2   Title  The patient will report a 60% decreased in left hip pain with walking 45 minutes and with rising sit to stand    Time  8    Period  Weeks    Status  New      PT LONG TERM GOAL #3   Title  The patient will have 4+/5 to 5-/5  left hip abduction strength needed to eliminate pelvic drop with single limb stance or prolonged standing    Time  8    Period  Weeks    Status  New      PT LONG TERM GOAL #4   Title  FOTO functional outcome score improved from 33% to 22% indicating improved function with less pain    Time  8    Period  Weeks    Status  New            Plan - 01/29/20 1629    Clinical Impression Statement  Patient reported some relief with ionto, but pain returned after 24 hours. HEP was issued for hip flexor, ITB and hip adductors the latter which were very tight and tender. She reponded well to DN and manual therapy to hip and lateral quads. She may benefit from further DN/man here and to left hip ADDuctors. No goals met as only second visit.    PT Frequency  2x / week    PT Duration  8 weeks    PT Treatment/Interventions  ADLs/Self Care Home Management;Iontophoresis 62m/ml Dexamethasone;Cryotherapy;Electrical Stimulation;Ultrasound;Moist Heat;Therapeutic exercise;Therapeutic activities;Neuromuscular re-education;Manual techniques;Dry needling;Taping    PT Next Visit Plan  assess response to ionto #2 to left greater trochanter;  assess DN to left gluteals,  lateral quads, and ITB; Continue DN to lateral HS and hip ADDuctors    PT Home Exercise Plan  pt has HEP from MD office ITB stretch, bridges and sidelying hip abduction    Consulted and Agree with Plan of Care  Patient       Patient will benefit from skilled therapeutic intervention in order to improve the following deficits and impairments:  Increased fascial restricitons, Increased muscle spasms, Pain, Decreased strength  Visit Diagnosis: Pain in left hip  Muscle weakness (generalized)  Cramp and spasm     Problem List Patient Active Problem List   Diagnosis Date Noted  . Trochanteric bursitis, left hip 01/10/2020  . Hypothyroid 02/12/2016    JMadelyn FlavorsPT 01/29/2020, 4:33 PM  [facility] [address] [place] [address]Phone: [phone]  Fax:  [phone] Name: DRalphine HinksMRN: [id number] of Birth: [birth year]

## 2020-01-29 NOTE — Patient Instructions (Signed)
Access Code: [facility] URL: [email or web address] Date: 01/29/2020 Prepared by: Almyra Free Nur Rabold  Exercises Seated Hip Flexor Stretch - 2 x daily - 7 x weekly - 1 sets - 3 reps - 30-60 sec hold Hip Adductors and Hamstring Stretch with Strap - 2 x daily - 7 x weekly - 1 sets - 3 reps - 60 seconds hold Supine ITB Stretch with Strap - 2 x daily - 7 x weekly - 3 reps - 1 sets - 60 sec hold  Patient Education Trigger Point Dry Needling

## 2020-02-05 ENCOUNTER — Ambulatory Visit: Payer: Medicare Other | Admitting: Physical Therapy

## 2020-02-05 ENCOUNTER — Other Ambulatory Visit: Payer: Self-pay

## 2020-02-05 ENCOUNTER — Encounter: Payer: Self-pay | Admitting: Physical Therapy

## 2020-02-05 DIAGNOSIS — R252 Cramp and spasm: Secondary | ICD-10-CM

## 2020-02-05 DIAGNOSIS — M25552 Pain in left hip: Secondary | ICD-10-CM

## 2020-02-05 DIAGNOSIS — M6281 Muscle weakness (generalized): Secondary | ICD-10-CM | POA: Diagnosis not present

## 2020-02-05 NOTE — Therapy (Signed)
[facility] [address] [place], [place], [address] Phone: [phone]   Fax:  [phone]  Physical Therapy Treatment  Patient Details  Name: Natasha Norman MRN: [id number] Date of Birth: 1951-03-13 Referring Provider (PT): Dr. [name]   Encounter Date: 02/05/2020  PT End of Session - 02/05/20 1450    Visit Number  3    Date for PT Re-Evaluation  03/20/20    Authorization Type  Medicare 10th visit progress note    PT Start Time  1448    PT Stop Time  1542    PT Time Calculation (min)  54 min    Activity Tolerance  Patient tolerated treatment well    Behavior During Therapy  [facility] for tasks assessed/performed       Past Medical History:  Diagnosis Date  . Cancer ([place]) 1996   Melanoma  . Chicken pox   . H/O repair of rotator cuff    Right   . Hypothyroidism   . Osteopenia   . Seasonal allergies     Past Surgical History:  Procedure Laterality Date  . BREAST BIOPSY Right 1984, 1996   Benign  . lymph node resection  1998    There were no vitals filed for this visit.  Subjective Assessment - 02/05/20 1450    Subjective  Some days I feel it's getting better and then some days not. I think the needling helped. No pain this morning, but then hurt during her walk (2-3 miles).    Pertinent History  right knee pain; osteopenia    Limitations  Walking;House hold activities    How long can you walk comfortably?  45 minutes and it hurts immediately and the whole time.    Diagnostic tests  x-ray no arthritis    Patient Stated Goals  stop hurting so I can walk on vacation    Currently in Pain?  No/denies                       [facility] Adult PT Treatment/Exercise - 02/05/20 0001      Knee/Hip Exercises: Stretches   Hip Flexor Stretch  Left;2 reps;20 seconds    Hip Flexor Stretch Limitations  with right SB and one with heel raises x 10    ITB Stretch  Left;1 rep;60 seconds    ITB Stretch  Limitations  with strap; also bil in standing with legs crossed 3 reps x 5 sec    Other Knee/Hip Stretches  hip ADD stretch with strap 1x60       Knee/Hip Exercises: Aerobic   Stationary Bike  L2 x 5 min      Knee/Hip Exercises: Sidelying   Hip ABduction  Left;20 reps    Hip ADduction  Left;10 reps    Clams  left 10 reps; then with green band x 10      Knee/Hip Exercises: Prone   Straight Leg Raises  Both;10 reps    Other Prone Exercises  opp arm/leg raise x 10 each      Manual Therapy   Manual Therapy  Soft tissue mobilization;Myofascial release    Manual therapy comments  skilled palpation and monitoring of soft tissues during DN    Soft tissue mobilization  to left gluteals and lateral quad       Trigger Point Dry Needling - 02/05/20 0001    Consent Given?  Yes    Education Handout Provided  Previously provided  Muscles Treated Lower Quadrant  Vastus lateralis    Muscles Treated Back/Hip  Gluteus minimus;Gluteus medius    Dry Needling Comments  left    Electrical Stimulation Performed with Dry Needling  Yes    E-stim with Dry Needling Details  freq 8 x 5 min to left lateral quad/ITB x 5 min    Vastus lateralis Response  Twitch response elicited;Palpable increased muscle length    Gluteus Minimus Response  Twitch response elicited;Palpable increased muscle length    Gluteus Medius Response  Twitch response elicited;Palpable increased muscle length    Piriformis Response  Palpable increased muscle length           PT Education - 02/05/20 1538    Education Details  HEP progressed    Person(s) Educated  Patient    Methods  Explanation;Demonstration;Handout    Comprehension  Verbalized understanding;Returned demonstration       PT Short Term Goals - 01/24/20 1727      PT SHORT TERM GOAL #1   Title  The patient will demonstrate knowledge of basic self care strategies and initial HEP    Time  4    Period  Weeks    Status  New    Target Date  02/21/20      PT  SHORT TERM GOAL #2   Title  The patient will report a 30% decrease in left hip pain with walking 45 minutes and with rising sit to stand    Time  4    Period  Weeks    Status  New        PT Long Term Goals - 01/24/20 1728      PT LONG TERM GOAL #1   Title  The patient will be independent in safe self progression of HEP    Time  8    Period  Weeks    Status  New    Target Date  03/20/20      PT LONG TERM GOAL #2   Title  The patient will report a 60% decreased in left hip pain with walking 45 minutes and with rising sit to stand    Time  8    Period  Weeks    Status  New      PT LONG TERM GOAL #3   Title  The patient will have 4+/5 to 5-/5  left hip abduction strength needed to eliminate pelvic drop with single limb stance or prolonged standing    Time  8    Period  Weeks    Status  New      PT LONG TERM GOAL #4   Title  FOTO functional outcome score improved from 33% to 22% indicating improved function with less pain    Time  8    Period  Weeks    Status  New            Plan - 02/05/20 1550    Clinical Impression Statement  Patient with some relief overall since last visit. She is able to perform standing ITB stretch with crossed legs without difficulty where previously she had to "unhitch" her hip. She has weakness in glut medius, but tolerated TE well today. Responded well to DN again in left glut min and med as well as lateral quads. Trial of estim with DN today. Patient reporting no pain upon standing and walking after.    Comorbidities  osteopenia    Examination-Activity Limitations  Locomotion Level;Transfers    Examination-Participation  Restrictions  Community Activity;Meal Prep;Cleaning    PT Frequency  2x / week    PT Duration  8 weeks    PT Treatment/Interventions  ADLs/Self Care Home Management;Iontophoresis 4mg /ml Dexamethasone;Cryotherapy;Electrical Stimulation;Ultrasound;Moist Heat;Therapeutic exercise;Therapeutic activities;Neuromuscular  re-education;Manual techniques;Dry needling;Taping    PT Next Visit Plan  Add ionto #3 prn to left greater trochanter;  assess DN to left gluteals, lateral quads, and ITB; Continue DN to lateral HS and hip ADDuctors    PT Home Exercise Plan  [place] and Agree with Plan of Care  Patient       Patient will benefit from skilled therapeutic intervention in order to improve the following deficits and impairments:  Increased fascial restricitons, Increased muscle spasms, Pain, Decreased strength  Visit Diagnosis: Pain in left hip  Muscle weakness (generalized)  Cramp and spasm     Problem List Patient Active Problem List   Diagnosis Date Noted  . Trochanteric bursitis, left hip 01/10/2020  . Hypothyroid 02/12/2016   Madelyn Flavors PT 02/05/2020, 3:57 PM  [facility] [address] [place], [place], [address] Phone: [phone]   Fax:  [phone]  Name: Natasha Norman MRN: [id number] Date of Birth: June 26, 1951

## 2020-02-05 NOTE — Patient Instructions (Signed)
Access Code: [email or web address] URL: [email or web address] Date: 01/29/2020 Prepared by: Almyra Free [place]  Exercises Seated Hip Flexor Stretch - 2 x daily - 7 x weekly - 1 sets - 3 reps - 30-60 sec hold Hip Adductors and Hamstring Stretch with Strap - 2 x daily - 7 x weekly - 1 sets - 3 reps - 60 seconds hold Supine ITB Stretch with Strap - 2 x daily - 7 x weekly - 3 reps - 1 sets - 60 sec hold Active Hip Flexor Stretch - 2 x daily - 7 x weekly - 1 sets - 10 reps Clamshell with Resistance - 1 x daily - 7 x weekly - 3 sets - 10 reps Prone Alternating Arm and Leg Lifts - 1 x daily - 7 x weekly - 3 sets - 10 reps - 5 sec hold  Patient Education Trigger Point Dry Needling

## 2020-02-06 ENCOUNTER — Encounter: Payer: Self-pay | Admitting: Physical Therapy

## 2020-02-06 ENCOUNTER — Ambulatory Visit: Payer: Medicare Other | Admitting: Physical Therapy

## 2020-02-06 ENCOUNTER — Encounter: Payer: Self-pay | Admitting: Adult Health

## 2020-02-06 ENCOUNTER — Ambulatory Visit (INDEPENDENT_AMBULATORY_CARE_PROVIDER_SITE_OTHER): Payer: Medicare Other | Admitting: Adult Health

## 2020-02-06 VITALS — BP 112/80 | Temp 96.8°F | Wt 131.0 lb

## 2020-02-06 DIAGNOSIS — M25552 Pain in left hip: Secondary | ICD-10-CM | POA: Diagnosis not present

## 2020-02-06 DIAGNOSIS — R252 Cramp and spasm: Secondary | ICD-10-CM | POA: Diagnosis not present

## 2020-02-06 DIAGNOSIS — L0291 Cutaneous abscess, unspecified: Secondary | ICD-10-CM

## 2020-02-06 DIAGNOSIS — M6281 Muscle weakness (generalized): Secondary | ICD-10-CM

## 2020-02-06 MED ORDER — DOXYCYCLINE HYCLATE 100 MG PO CAPS: 100.0000 mg | ORAL_CAPSULE | Freq: Two times a day (BID) | ORAL | 0 refills | Status: DC

## 2020-02-06 NOTE — Therapy (Signed)
[facility] [address], [place], [place], [address] Phone: [phone]   Fax:  [phone]  Physical Therapy Treatment  Patient Details  Name: Natasha Norman MRN: [id number] Date of Birth: 04-06-1951 Referring Provider (PT): Dr. [name]   Encounter Date: 02/06/2020  PT End of Session - 02/06/20 1449    Visit Number  4    Date for PT Re-Evaluation  03/20/20    Authorization Type  Medicare 10th visit progress note    PT Start Time  1449    PT Stop Time  1527    PT Time Calculation (min)  38 min    Activity Tolerance  Patient tolerated treatment well    Behavior During Therapy  [facility] for tasks assessed/performed       Past Medical History:  Diagnosis Date  . Cancer ([place]) 1996   Melanoma  . Chicken pox   . H/O repair of rotator cuff    Right   . Hypothyroidism   . Osteopenia   . Seasonal allergies     Past Surgical History:  Procedure Laterality Date  . BREAST BIOPSY Right 1984, 1996   Benign  . lymph node resection  1998    There were no vitals filed for this visit.                     [place] Adult PT Treatment/Exercise - 02/06/20 0001      Knee/Hip Exercises: Stretches   Piriformis Stretch  Left;1 rep;20 seconds    Piriformis Stretch Limitations  fig 4 supine after hip TE    Other Knee/Hip Stretches  bil hip IR in straight leg position x 20 sec bil      Knee/Hip Exercises: Sidelying   Hip ABduction  Left;20 reps    Hip ADduction  Left;20 reps    Clams  left green band x 20      Knee/Hip Exercises: Prone   Other Prone Exercises  Quadriped: left hip ext x 20; mule kick x 20       Modalities   Modalities  Iontophoresis      Iontophoresis   Type of Iontophoresis  Dexamethasone   #2   Location  left inferior hip greater trochanter     Dose  4 mg/ml    Time  4-6 hour patch       Manual Therapy   Manual Therapy  Soft tissue mobilization;Myofascial release    Soft  tissue mobilization  to left hip ADDuctors; left TFL             PT Education - 02/06/20 1528    Education Details  HEP progressed    Person(s) Educated  Patient    Methods  Explanation;Demonstration;Handout    Comprehension  Verbalized understanding;Returned demonstration       PT Short Term Goals - 01/24/20 1727      PT SHORT TERM GOAL #1   Title  The patient will demonstrate knowledge of basic self care strategies and initial HEP    Time  4    Period  Weeks    Status  New    Target Date  02/21/20      PT SHORT TERM GOAL #2   Title  The patient will report a 30% decrease in left hip pain with walking 45 minutes and with rising sit to stand    Time  4    Period  Weeks    Status  New  PT Long Term Goals - 01/24/20 1728      PT LONG TERM GOAL #1   Title  The patient will be independent in safe self progression of HEP    Time  8    Period  Weeks    Status  New    Target Date  03/20/20      PT LONG TERM GOAL #2   Title  The patient will report a 60% decreased in left hip pain with walking 45 minutes and with rising sit to stand    Time  8    Period  Weeks    Status  New      PT LONG TERM GOAL #3   Title  The patient will have 4+/5 to 5-/5  left hip abduction strength needed to eliminate pelvic drop with single limb stance or prolonged standing    Time  8    Period  Weeks    Status  New      PT LONG TERM GOAL #4   Title  FOTO functional outcome score improved from 33% to 22% indicating improved function with less pain    Time  8    Period  Weeks    Status  New            Plan - 02/06/20 1528    Clinical Impression Statement  Patient with some relief since yesterday. She did well with progressive strengthening for her left hip without c/o pain. She is still point tender at inferior greater trochanter and ionto #3 was applied here. She tolerated STW to left hip ADDuctors well. She has tenderness in her left TFL which may benefit from manual or DN  as well.    Comorbidities  osteopenia    PT Treatment/Interventions  ADLs/Self Care Home Management;Iontophoresis 4mg /ml Dexamethasone;Cryotherapy;Electrical Stimulation;Ultrasound;Moist Heat;Therapeutic exercise;Therapeutic activities;Neuromuscular re-education;Manual techniques;Dry needling;Taping    PT Next Visit Plan  Assess ionto #3;  assess DN to left gluteals, lateral quads, and ITB; Continue hip strengthening; DN prn to left TFL, gluteals, piriformis, lateral quad.    PT Home Exercise Plan  [facility]       Patient will benefit from skilled therapeutic intervention in order to improve the following deficits and impairments:  Increased fascial restricitons, Increased muscle spasms, Pain, Decreased strength  Visit Diagnosis: Pain in left hip  Muscle weakness (generalized)  Cramp and spasm     Problem List Patient Active Problem List   Diagnosis Date Noted  . Trochanteric bursitis, left hip 01/10/2020  . Hypothyroid 02/12/2016    Natasha Norman PT 02/06/2020, 3:37 PM  [facility] [address] [place], [place], [address] Phone: [phone]   Fax:  [phone]  Name: Natasha Norman MRN: [id number] Date of Birth: 1951-01-07

## 2020-02-06 NOTE — Progress Notes (Signed)
Subjective:    Patient ID: Natasha Norman, female    DOB: [birth year], 69 y.o.   MRN: [id number]  HPI 69 year old female who  has a past medical history of Cancer ([place]) (1996), Chicken pox, H/O repair of rotator cuff, Hypothyroidism, Osteopenia, and Seasonal allergies.  She presents to the office today for a " bump" in her left groin. She first noticed the bump about a week and a half ago. She denies pain, redness, warmth, drainage, fevers, or chills.   Review of Systems See HPI   Past Medical History:  Diagnosis Date  . Cancer ([place]) 1996   Melanoma  . Chicken pox   . H/O repair of rotator cuff    Right   . Hypothyroidism   . Osteopenia   . Seasonal allergies     Social History   Socioeconomic History  . Marital status: Married    Spouse name: Not on file  . Number of children: 3  . Years of education: 4 years college  . Highest education level: Bachelor's degree (e.g., BA, AB, BS)  Occupational History  . Occupation: Retired  Tobacco Use  . Smoking status: Never Smoker  . Smokeless tobacco: Never Used  Substance and Sexual Activity  . Alcohol use: Yes    Alcohol/week: 1.0 standard drinks    Types: 1 Glasses of wine per week    Comment: Wine occasionally  . Drug use: No  . Sexual activity: Not on file  Other Topics Concern  . Not on file  Social History Narrative   Retired Geologist, engineering   Married + Animal nutritionist from [place]    3 children   Social Determinants of Radio broadcast assistant Strain:   . Difficulty of Paying Living Expenses:   Food Insecurity: No Food Insecurity  . Worried About Charity fundraiser in the Last Year: Never true  . Ran Out of Food in the Last Year: Never true  Transportation Needs:   . Lack of Transportation (Medical):   Marland Kitchen Lack of Transportation (Non-Medical):   Physical Activity: Sufficiently Active  . Days of Exercise per Week: 7 days  . Minutes of Exercise per Session: 40 min  Stress:   .  Feeling of Stress :   Social Connections:   . Frequency of Communication with Friends and Family:   . Frequency of Social Gatherings with Friends and Family:   . Attends Religious Services:   . Active Member of Clubs or Organizations:   . Attends Archivist Meetings:   Marland Kitchen Marital Status:   Intimate Partner Violence:   . Fear of Current or Ex-Partner:   . Emotionally Abused:   Marland Kitchen Physically Abused:   . Sexually Abused:     Past Surgical History:  Procedure Laterality Date  . BREAST BIOPSY Right 1984, 1996   Benign  . lymph node resection  1998    Family History  Problem Relation Age of Onset  . Cancer Mother        breast cancer  . Breast cancer Mother   . Heart disease Father   . Diabetes Father   . Heart failure Maternal Grandmother   . Stroke Paternal Grandfather     No Known Allergies  Current Outpatient Medications on File Prior to Visit  Medication Sig Dispense Refill  . calcium carbonate (CALCIUM 600) 600 MG TABS tablet Take 600 mg by mouth 2 (two) times daily with a meal.    .  GLUCOSAMINE-CHONDROITIN PO Take by mouth.    . levothyroxine (SYNTHROID) 50 MCG tablet Take 1 tablet (50 mcg total) by mouth daily before breakfast. 90 tablet 3  . loratadine (CLARITIN) 10 MG tablet Take 10 mg by mouth daily.    . Multiple Vitamin (MULTIVITAMIN) tablet Take 1 tablet by mouth daily.    Marland Kitchen VITAMIN D PO Take by mouth.     No current facility-administered medications on file prior to visit.    BP 112/80   Temp (!) 96.8 F (36 C)   Wt 131 lb (59.4 kg)   BMI 21.80 kg/m       Objective:   Physical Exam Vitals and nursing note reviewed.  Constitutional:      Appearance: Normal appearance.  Skin:    General: Skin is warm and dry.     Capillary Refill: Capillary refill takes less than 2 seconds.     Comments: 1 cm non flucuant abscess   Neurological:     General: No focal deficit present.     Mental Status: She is alert and oriented to person, place, and  time.  Psychiatric:        Mood and Affect: Mood normal.        Behavior: Behavior normal.        Assessment & Plan:  1. Abscess - Warm compress throughout the day  - doxycycline (VIBRAMYCIN) 100 MG capsule; Take 1 capsule (100 mg total) by mouth 2 (two) times daily.  Dispense: 14 capsule; Refill: 0 - Follow up if it does not resolve   Dorothyann Peng, NP

## 2020-02-12 ENCOUNTER — Other Ambulatory Visit: Payer: Self-pay

## 2020-02-12 ENCOUNTER — Encounter: Payer: Self-pay | Admitting: Physical Therapy

## 2020-02-12 ENCOUNTER — Ambulatory Visit: Payer: Medicare Other | Admitting: Physical Therapy

## 2020-02-12 DIAGNOSIS — M25552 Pain in left hip: Secondary | ICD-10-CM

## 2020-02-12 DIAGNOSIS — M6281 Muscle weakness (generalized): Secondary | ICD-10-CM

## 2020-02-12 DIAGNOSIS — R252 Cramp and spasm: Secondary | ICD-10-CM | POA: Diagnosis not present

## 2020-02-12 NOTE — Therapy (Signed)
[facility] [address] [place], [place], [address] Phone: [phone]   Fax:  [phone]  Physical Therapy Treatment  Patient Details  Name: Natasha Norman MRN: [id number] Date of Birth: [birth year] Referring Provider (PT): Dr. [name]   Encounter Date: 02/12/2020  PT End of Session - 02/12/20 0936    Visit Number  5    Date for PT Re-Evaluation  03/20/20    Authorization Type  Medicare 10th visit progress note    PT Start Time  0931    PT Stop Time  1010    PT Time Calculation (min)  39 min    Activity Tolerance  Patient tolerated treatment well    Behavior During Therapy  [facility] for tasks assessed/performed       Past Medical History:  Diagnosis Date  . Cancer ([place]) 1996   Melanoma  . Chicken pox   . H/O repair of rotator cuff    Right   . Hypothyroidism   . Osteopenia   . Seasonal allergies     Past Surgical History:  Procedure Laterality Date  . BREAST BIOPSY Right 1984, 1996   Benign  . lymph node resection  1998    There were no vitals filed for this visit.  Subjective Assessment - 02/12/20 0934    Subjective  Pt states that she is doing well. Sunday was a good day but Monday was uncomfortable when she woke up.    Pertinent History  right knee pain; osteopenia    Limitations  Walking;House hold activities    How long can you walk comfortably?  45 minutes and it hurts immediately and the whole time.    Diagnostic tests  x-ray no arthritis    Patient Stated Goals  stop hurting so I can walk on vacation    Currently in Pain?  No/denies   just discomfort with walking                       [facility] Adult PT Treatment/Exercise - 02/12/20 0001      Self-Care   Self-Care  Posture    Posture  sleeping posture with pillow between knees and feet       Exercises   Exercises  Other Exercises    Other Exercises   quadruped hip extension with toe tap x10 reps each, increased  difficulty stabilizing on Lt LE       Knee/Hip Exercises: Stretches   Other Knee/Hip Stretches  unable to complete figure 4 without increased pain Lt hip      Knee/Hip Exercises: Standing   Other Standing Knee Exercises  single leg toe tap side and back x10 reps side, x10 reps back       Knee/Hip Exercises: Supine   Bridges  Strengthening;Both;2 sets;10 reps    Bridges Limitations  2nd set with band around knees       Manual Therapy   Manual therapy comments  rolling Lt ITB    Soft tissue mobilization  Lt gluteals gentle STM, pt in sidelying              PT Education - 02/12/20 1241    Education Details  technique with therex    Person(s) Educated  Patient    Methods  Explanation;Verbal cues;Demonstration    Comprehension  Verbalized understanding;Returned demonstration       PT Short Term Goals - 01/24/20 1727      PT SHORT TERM GOAL #  1   Title  The patient will demonstrate knowledge of basic self care strategies and initial HEP    Time  4    Period  Weeks    Status  New    Target Date  02/21/20      PT SHORT TERM GOAL #2   Title  The patient will report a 30% decrease in left hip pain with walking 45 minutes and with rising sit to stand    Time  4    Period  Weeks    Status  New        PT Long Term Goals - 01/24/20 1728      PT LONG TERM GOAL #1   Title  The patient will be independent in safe self progression of HEP    Time  8    Period  Weeks    Status  New    Target Date  03/20/20      PT LONG TERM GOAL #2   Title  The patient will report a 60% decreased in left hip pain with walking 45 minutes and with rising sit to stand    Time  8    Period  Weeks    Status  New      PT LONG TERM GOAL #3   Title  The patient will have 4+/5 to 5-/5  left hip abduction strength needed to eliminate pelvic drop with single limb stance or prolonged standing    Time  8    Period  Weeks    Status  New      PT LONG TERM GOAL #4   Title  FOTO functional outcome  score improved from 33% to 22% indicating improved function with less pain    Time  8    Period  Weeks    Status  New            Plan - 02/12/20 1242    Clinical Impression Statement  Pt continues to have decreased pain with daily activity. Pt's walking remains limited, with noted trendelenburg deviation. Pt was able to complete gentle hip strengthening exercises and PT reviewed her current HEP to increase adherence and improve technique. Pt reported Lt gluteal fatigue with several of today's exercises and PT monitored for pain response, making any necessary adjustments. Ended with soft tissue mobilization techniques to decrease tension in the Lt ITB and gluteals. Pt was encouraged to modify walking distance moving forward.    Comorbidities  osteopenia    PT Treatment/Interventions  ADLs/Self Care Home Management;Iontophoresis 4mg /ml Dexamethasone;Cryotherapy;Electrical Stimulation;Ultrasound;Moist Heat;Therapeutic exercise;Therapeutic activities;Neuromuscular re-education;Manual techniques;Dry needling;Taping    PT Next Visit Plan  possible ionto #3;  possible d/n Lt quad/glutes; Continue hip strengthening; f/u on sleep and walking modifications    PT Home Exercise Plan  [facility]       Patient will benefit from skilled therapeutic intervention in order to improve the following deficits and impairments:  Increased fascial restricitons, Increased muscle spasms, Pain, Decreased strength  Visit Diagnosis: Pain in left hip  Muscle weakness (generalized)  Cramp and spasm     Problem List Patient Active Problem List   Diagnosis Date Noted  . Trochanteric bursitis, left hip 01/10/2020  . Hypothyroid 02/12/2016    12:49 PM,02/12/20 Sherol Dade PT, DPT [place] [facility] [address], [place], [place], [address] Phone: [phone]   Fax:   [phone]  Name: Natasha Norman  MRN: [id number] Date of Birth: [birth year]

## 2020-02-18 ENCOUNTER — Ambulatory Visit (INDEPENDENT_AMBULATORY_CARE_PROVIDER_SITE_OTHER): Payer: Medicare Other | Admitting: Family Medicine

## 2020-02-18 ENCOUNTER — Other Ambulatory Visit: Payer: Self-pay

## 2020-02-18 ENCOUNTER — Encounter: Payer: Self-pay | Admitting: Family Medicine

## 2020-02-18 VITALS — BP 112/78 | HR 82 | Ht 65.0 in | Wt 132.2 lb

## 2020-02-18 DIAGNOSIS — M7062 Trochanteric bursitis, left hip: Secondary | ICD-10-CM | POA: Diagnosis not present

## 2020-02-18 NOTE — Patient Instructions (Signed)
Thank you for coming in today. Plan on increasing hip abduction strength.  Work on side leg raises, leg back side leg raises and front step down slowly exercises.  Plan 15-30 reps 2x daily.  Stop the step drills if they hurt your knee.  Recheck with me in 6 weeks.  Let me know if not improving. Could do injection or MRI sooner.

## 2020-02-18 NOTE — Progress Notes (Signed)
   I, Wendy Poet, LAT, ATC, am serving as scribe for Dr. [name].  Natasha Norman is a 69 y.o. female who presents to [place] [facility] today for f/u of L lateral hip pain.  She was last seen by Dr. Georgina Snell on 01/10/20 and was referred to outpatient PT of which she has completed 5 visits.  She was also provided a HEP consisting of hip aBd, bridges and ITB stretching.  Since her last visit, pt reports that she is doing better and reports decreased pain. She rates her improvement at 50%.  She is walking 2-3 miles every morning and con't to have pain while she's walking.  She states that she has more PT sessions scheduled.    Diagnostic testing: L hip XR- 12/14/19   Pertinent review of systems: No fever or chills  Relevant historical information: Hypothyroidism    Exam:  BP 112/78 (BP Location: Right Arm, Patient Position: Sitting, Cuff Size: Normal)   Pulse 82   Ht 5\' 5"  (1.651 m)   Wt 132 lb 3.2 oz (60 kg)   SpO2 99%   BMI 22.00 kg/m  General: Well Developed, well nourished, and in no acute distress.   MSK: Left hip normal-appearing Normal motion. Tender palpation overlying greater trochanter. Hip abduction strength is diminished 4/5.  External rotation internal rotation and adduction strength is intact. Normal gait. Leg lengths are equal.      Assessment and Plan: 69 y.o. female with left hip abductor tendinopathy/trochanteric bursitis.  Improved but not resolved.  Patient still has quite a bit of hip abduction weakness.  After review physical therapy home exercise teaching she is doing a lot of stretching and work on her external rotation but not a lot of hip abductor strengthening.  Recommended continue physical therapy.  I taught side leg raises, 30 degree posterior side leg raise to engage gluteus medius specifically, and eccentric step drills.  Recommend with physical therapy to add a bit more strengthening as I think that would help however  will defer to physical therapy expertise on this.  Additionally discussed with patient her options.  Next steps would be injection or MRI or both.  Likely would proceed to MRI if not improving to further quantify and clarify sources of pain.  Recheck 6 weeks.  Contact me sooner if not doing well.    Discussed warning signs or symptoms. Please see discharge instructions. Patient expresses understanding.   The above documentation has been reviewed and is accurate and complete [name], M.D.

## 2020-02-19 ENCOUNTER — Other Ambulatory Visit: Payer: Self-pay

## 2020-02-19 ENCOUNTER — Encounter: Payer: Self-pay | Admitting: Physical Therapy

## 2020-02-19 ENCOUNTER — Ambulatory Visit: Payer: Medicare Other | Admitting: Physical Therapy

## 2020-02-19 DIAGNOSIS — M25552 Pain in left hip: Secondary | ICD-10-CM

## 2020-02-19 DIAGNOSIS — M6281 Muscle weakness (generalized): Secondary | ICD-10-CM

## 2020-02-19 DIAGNOSIS — R252 Cramp and spasm: Secondary | ICD-10-CM | POA: Diagnosis not present

## 2020-02-19 NOTE — Therapy (Signed)
[facility] [address], [place], [place], [address] Phone: [phone]   Fax:  [phone]  Physical Therapy Treatment  Patient Details  Name: Natasha Norman MRN: [id number] Date of Birth: 1951/03/01 Referring Provider (PT): Dr. [name]   Encounter Date: 02/19/2020  PT End of Session - 02/19/20 1203    Visit Number  6    Date for PT Re-Evaluation  03/20/20    Authorization Type  Medicare 10th visit progress note    PT Start Time  1151    PT Stop Time  1235    PT Time Calculation (min)  44 min    Activity Tolerance  Patient tolerated treatment well    Behavior During Therapy  [facility] for tasks assessed/performed       Past Medical History:  Diagnosis Date  . Cancer ([place]) 1996   Melanoma  . Chicken pox   . H/O repair of rotator cuff    Right   . Hypothyroidism   . Osteopenia   . Seasonal allergies     Past Surgical History:  Procedure Laterality Date  . BREAST BIOPSY Right 1984, 1996   Benign  . lymph node resection  1998    There were no vitals filed for this visit.  Subjective Assessment - 02/19/20 1202    Subjective  Pt states that she was sore for a day following her last session. She has alot of exercises to complete at home.    Pertinent History  right knee pain; osteopenia    Limitations  Walking;House hold activities    How long can you walk comfortably?  45 minutes and it hurts immediately and the whole time.    Diagnostic tests  x-ray no arthritis    Patient Stated Goals  stop hurting so I can walk on vacation    Currently in Pain?  No/denies                        [facility] Adult PT Treatment/Exercise - 02/19/20 0001      Knee/Hip Exercises: Machines for Strengthening   Total Gym Leg Press  Seat 7: #55 Lt 2x10 reps       Knee/Hip Exercises: Standing   Step Down  Left;Right;1 set;10 reps    Other Standing Knee Exercises  sit to stand on foam pad 2x5 reps without UE  support    Other Standing Knee Exercises  single leg toe tap to the side and back on foam pad: 5x5 sec hold each side (intermittent UE support)       Knee/Hip Exercises: Supine   Bridges  Strengthening;Both;2 sets;10 reps    Bridges Limitations  red TB around knees, 5 sec hold     Bridges with [employer]  Strengthening;Both;2 sets;10 reps      Knee/Hip Exercises: Sidelying   Other Sidelying Knee/Hip Exercises  Lt side plank on elbows/knees 6x5 sec hold              PT Education - 02/19/20 1300    Education Details  technique with therex    Person(s) Educated  Patient    Methods  Explanation;Verbal cues;Handout    Comprehension  Verbalized understanding;Returned demonstration       PT Short Term Goals - 01/24/20 1727      PT SHORT TERM GOAL #1   Title  The patient will demonstrate knowledge of basic self care strategies and initial HEP    Time  4    Period  Weeks    Status  New    Target Date  02/21/20      PT SHORT TERM GOAL #2   Title  The patient will report a 30% decrease in left hip pain with walking 45 minutes and with rising sit to stand    Time  4    Period  Weeks    Status  New        PT Long Term Goals - 01/24/20 1728      PT LONG TERM GOAL #1   Title  The patient will be independent in safe self progression of HEP    Time  8    Period  Weeks    Status  New    Target Date  03/20/20      PT LONG TERM GOAL #2   Title  The patient will report a 60% decreased in left hip pain with walking 45 minutes and with rising sit to stand    Time  8    Period  Weeks    Status  New      PT LONG TERM GOAL #3   Title  The patient will have 4+/5 to 5-/5  left hip abduction strength needed to eliminate pelvic drop with single limb stance or prolonged standing    Time  8    Period  Weeks    Status  New      PT LONG TERM GOAL #4   Title  FOTO functional outcome score improved from 33% to 22% indicating improved function with less pain    Time  8    Period   Weeks    Status  New            Plan - 02/19/20 1301    Clinical Impression Statement  Pt had some soreness following rolling and other soft tissue techniques completed at her last session. PT made several adjustments to her HEP with primary focus on increased load and strength of the gluteals. Pt was unable to complete step down without increase in knee or hip pain, but she was able to perform single leg press without difficulty. Pt has knee valgus deviation on BLE, but this was improved following PT verbal cuing during leg press and sit to stand. Pt demonstrated understanding of HEP updates and denied increase in hip pain end of session.    Comorbidities  osteopenia    PT Treatment/Interventions  ADLs/Self Care Home Management;Iontophoresis 4mg /ml Dexamethasone;Cryotherapy;Electrical Stimulation;Ultrasound;Moist Heat;Therapeutic exercise;Therapeutic activities;Neuromuscular re-education;Manual techniques;Dry needling;Taping    PT Next Visit Plan  d/n Lt quad/glutes if needed; Continue hip strengthening progression: standing hip abduction, leg press progression    PT Home Exercise Plan  [facility]       Patient will benefit from skilled therapeutic intervention in order to improve the following deficits and impairments:  Increased fascial restricitons, Increased muscle spasms, Pain, Decreased strength  Visit Diagnosis: Pain in left hip  Muscle weakness (generalized)  Cramp and spasm     Problem List Patient Active Problem List   Diagnosis Date Noted  . Trochanteric bursitis, left hip 01/10/2020  . Hypothyroid 02/12/2016   1:07 PM,02/19/20 Sherol Dade PT, DPT [place] [facility] [address], [place], [place], [address] Phone: [phone]   Fax:  [phone]  Name: Natasha Norman MRN: [id number] Date of Birth: 06/01/1951

## 2020-02-21 ENCOUNTER — Other Ambulatory Visit: Payer: Self-pay

## 2020-02-21 ENCOUNTER — Ambulatory Visit: Payer: Medicare Other | Admitting: Physical Therapy

## 2020-02-21 DIAGNOSIS — M6281 Muscle weakness (generalized): Secondary | ICD-10-CM | POA: Diagnosis not present

## 2020-02-21 DIAGNOSIS — R252 Cramp and spasm: Secondary | ICD-10-CM

## 2020-02-21 DIAGNOSIS — M25552 Pain in left hip: Secondary | ICD-10-CM | POA: Diagnosis not present

## 2020-02-21 NOTE — Therapy (Signed)
[facility] [address], [place], [place], [address] Phone: [phone]   Fax:  [phone]  Physical Therapy Treatment  Patient Details  Name: Natasha Norman MRN: [id number] Date of Birth: 13-Jun-1951 Referring Provider (PT): Dr. [name]   Encounter Date: 02/21/2020  PT End of Session - 02/21/20 1649    Visit Number  7    Date for PT Re-Evaluation  03/20/20    Authorization Type  Medicare 10th visit progress note    PT Start Time  [id number]    PT Stop Time  1530    PT Time Calculation (min)  45 min    Activity Tolerance  Patient tolerated treatment well       Past Medical History:  Diagnosis Date  . Cancer ([place]) 1996   Melanoma  . Chicken pox   . H/O repair of rotator cuff    Right   . Hypothyroidism   . Osteopenia   . Seasonal allergies     Past Surgical History:  Procedure Laterality Date  . BREAST BIOPSY Right 1984, 1996   Benign  . lymph node resection  1998    There were no vitals filed for this visit.  Subjective Assessment - 02/21/20 1444    Subjective  Soreness lateral hip.  It's up and down.    Pertinent History  right knee pain; osteopenia    How long can you walk comfortably?  2-3 miles    Currently in Pain?  Yes    Pain Score  2     Pain Location  Hip    Pain Orientation  Left    Aggravating Factors   sidelying. sit to stand                        [facility] Adult PT Treatment/Exercise - 02/21/20 0001      Knee/Hip Exercises: Seated   Other Seated Knee/Hip Exercises  verbal review of HEP discussed holding step down ex given by the doctor (secondary to pain) and hold off on rolling soft tissue with a can at home due to sensitivity       Moist Heat Therapy   Number Minutes Moist Heat  3 Minutes    Moist Heat Location  Hip      Iontophoresis   Type of Iontophoresis  Dexamethasone   #3   Location  left inferior hip greater trochanter     Dose  4 mg/ml    Time   4-6 hour patch       Manual Therapy   Manual therapy comments  attempted piriformis contract relax but painful in 2:00 position     Joint Mobilization  left long axis distraction, inferior, AP in internal rotation grade 3 3x 30 sec each     Soft tissue mobilization  to left gluteals and lateral quad    Myofascial Release  to left ITB       Trigger Point Dry Needling - 02/21/20 0001    Consent Given?  Yes    Muscles Treated Lower Quadrant  Hamstring    Vastus lateralis Response  Twitch response elicited;Palpable increased muscle length    Hamstring Response  Palpable increased muscle length    Gluteus Minimus Response  Twitch response elicited;Palpable increased muscle length    Gluteus Medius Response  Twitch response elicited;Palpable increased muscle length    Gluteus Maximus Response  Twitch response elicited;Palpable increased muscle length    Piriformis Response  Palpable increased muscle length             PT Short Term Goals - 02/21/20 1655      PT SHORT TERM GOAL #1   Title  The patient will demonstrate knowledge of basic self care strategies and initial HEP    Status  Achieved      PT SHORT TERM GOAL #2   Title  The patient will report a 30% decrease in left hip pain with walking 45 minutes and with rising sit to stand    Time  4    Period  Weeks    Status  On-going        PT Long Term Goals - 01/24/20 1728      PT LONG TERM GOAL #1   Title  The patient will be independent in safe self progression of HEP    Time  8    Period  Weeks    Status  New    Target Date  03/20/20      PT LONG TERM GOAL #2   Title  The patient will report a 60% decreased in left hip pain with walking 45 minutes and with rising sit to stand    Time  8    Period  Weeks    Status  New      PT LONG TERM GOAL #3   Title  The patient will have 4+/5 to 5-/5  left hip abduction strength needed to eliminate pelvic drop with single limb stance or prolonged standing    Time  8     Period  Weeks    Status  New      PT LONG TERM GOAL #4   Title  FOTO functional outcome score improved from 33% to 22% indicating improved function with less pain    Time  8    Period  Weeks    Status  New            Plan - 02/21/20 1650    Clinical Impression Statement  The patient reports variable status where she feels really good for a while then she will have the return of soreness.  She demonstrates good compliance with HEP and we discussed that functional strength changes may not be apparent for another 4 weeks or so.  Although she may have some hip joint degenerative changes, there is a myofascial component with multiple tender points in gluteals, piriformis, lateral quads and lateral HS.  She is receptive to DN and manual therapy with improved soft tissue length and joint mobility following.  Therapist closely monitoring response with all interventions.    Rehab Potential  Good    PT Frequency  2x / week    PT Duration  8 weeks    PT Treatment/Interventions  ADLs/Self Care Home Management;Iontophoresis 4mg /ml Dexamethasone;Cryotherapy;Electrical Stimulation;Ultrasound;Moist Heat;Therapeutic exercise;Therapeutic activities;Neuromuscular re-education;Manual techniques;Dry needling;Taping    PT Next Visit Plan  ionto #4; check STG #2 next visit;  d/n Lt quad/glutes if needed; Continue hip strengthening progression: standing hip abduction, leg press progression    PT Home Exercise Plan  [facility]       Patient will benefit from skilled therapeutic intervention in order to improve the following deficits and impairments:  Increased fascial restricitons, Increased muscle spasms, Pain, Decreased strength  Visit Diagnosis: Pain in left hip  Muscle weakness (generalized)  Cramp and spasm     Problem List Patient Active Problem List   Diagnosis Date Noted  . Trochanteric bursitis, left  hip 01/10/2020  . Hypothyroid 02/12/2016   Ruben Im, PT 02/21/20 4:57 PM Phone:  [phone] Fax: [phone] Alvera Singh 02/21/2020, 4:57 PM  [facility] [address], [place], [place], [address] Phone: [phone]   Fax:  [phone]  Name: Natasha Norman MRN: [id number] Date of Birth: 24-Dec-1950

## 2020-02-22 ENCOUNTER — Encounter: Payer: Medicare Other | Admitting: Physical Therapy

## 2020-02-27 ENCOUNTER — Ambulatory Visit: Payer: Medicare Other | Attending: Family Medicine | Admitting: Physical Therapy

## 2020-02-27 ENCOUNTER — Other Ambulatory Visit: Payer: Self-pay

## 2020-02-27 ENCOUNTER — Encounter: Payer: Self-pay | Admitting: Physical Therapy

## 2020-02-27 DIAGNOSIS — M6281 Muscle weakness (generalized): Secondary | ICD-10-CM | POA: Insufficient documentation

## 2020-02-27 DIAGNOSIS — R252 Cramp and spasm: Secondary | ICD-10-CM

## 2020-02-27 DIAGNOSIS — M25552 Pain in left hip: Secondary | ICD-10-CM | POA: Diagnosis not present

## 2020-02-27 NOTE — Therapy (Signed)
[facility] [address], [place], [place], [address] Phone: [phone]   Fax:  [phone]  Physical Therapy Treatment  Patient Details  Name: Natasha Norman MRN: [id number] Date of Birth: Feb 01, 1951 Referring Provider (PT): Dr. [name]   Encounter Date: 02/27/2020  PT End of Session - 02/27/20 1142    Visit Number  8    Date for PT Re-Evaluation  03/20/20    Authorization Type  Medicare 10th visit progress note    PT Start Time  1102    PT Stop Time  1143    PT Time Calculation (min)  41 min    Activity Tolerance  Patient tolerated treatment well;No increased pain       Past Medical History:  Diagnosis Date  . Cancer ([place]) 1996   Melanoma  . Chicken pox   . H/O repair of rotator cuff    Right   . Hypothyroidism   . Osteopenia   . Seasonal allergies     Past Surgical History:  Procedure Laterality Date  . BREAST BIOPSY Right 1984, 1996   Benign  . lymph node resection  1998    There were no vitals filed for this visit.  Subjective Assessment - 02/27/20 1105    Subjective  Pt states that things are going ok. She was sore over the night about 2 days ago but today isn't as bad.    Pertinent History  right knee pain; osteopenia    How long can you walk comfortably?  2-3 miles    Currently in Pain?  Yes    Pain Score  1     Pain Location  Hip    Pain Orientation  Left;Lateral    Pain Descriptors / Indicators  Sore    Pain Type  Chronic pain    Pain Radiating Towards  none                        [facility] Adult PT Treatment/Exercise - 02/27/20 0001      Knee/Hip Exercises: Standing   Other Standing Knee Exercises  hip 3 way slider x10 reps each direction on each LE      Knee/Hip Exercises: Seated   Other Seated Knee/Hip Exercises  Lt hip IR/ER yellow TB x10 reps, 2nd set into ER      Knee/Hip Exercises: Supine   Other Supine Knee/Hip Exercises  clamshell isometric 10x5 sec      Other Supine Knee/Hip Exercises  hamstring curl isometric with red ball 10x3 sec hold; straight leg bridge on red ball 3x10 reps       Knee/Hip Exercises: Sidelying   Hip ABduction  Left;1 set;10 reps    Hip ABduction Limitations  pillow between knees     Hip ADduction  Strengthening;Left;Right;1 set;10 reps             PT Education - 02/27/20 1149    Education Details  technique with therex    Person(s) Educated  Patient    Methods  Explanation;Verbal cues    Comprehension  Verbalized understanding;Returned demonstration       PT Short Term Goals - 02/27/20 1139      PT SHORT TERM GOAL #1   Title  The patient will demonstrate knowledge of basic self care strategies and initial HEP    Status  Achieved      PT SHORT TERM GOAL #2   Title  The patient will report a  30% decrease in left hip pain with walking 45 minutes and with rising sit to stand    Baseline  25% improved    Time  4    Period  Weeks    Status  On-going        PT Long Term Goals - 01/24/20 1728      PT LONG TERM GOAL #1   Title  The patient will be independent in safe self progression of HEP    Time  8    Period  Weeks    Status  New    Target Date  03/20/20      PT LONG TERM GOAL #2   Title  The patient will report a 60% decreased in left hip pain with walking 45 minutes and with rising sit to stand    Time  8    Period  Weeks    Status  New      PT LONG TERM GOAL #3   Title  The patient will have 4+/5 to 5-/5  left hip abduction strength needed to eliminate pelvic drop with single limb stance or prolonged standing    Time  8    Period  Weeks    Status  New      PT LONG TERM GOAL #4   Title  FOTO functional outcome score improved from 33% to 22% indicating improved function with less pain    Time  8    Period  Weeks    Status  New            Plan - 02/27/20 1143    Clinical Impression Statement  Pt feels she is 25% improved since starting PT. She is noticing improvements in  pain when she wakes in the morning as well as the intensity of her pain throughout the day. Session continued with focus on therex to increase hip muscle strength and endurance. Pt noted Lt gluteal fatigue with the standing 3 way hip reach but denied any increase in pain end of session. She would continue to benefit from skilled PT to promote glute strength and decrease pain with activity.    Rehab Potential  Good    PT Frequency  2x / week    PT Duration  8 weeks    PT Treatment/Interventions  ADLs/Self Care Home Management;Iontophoresis 4mg /ml Dexamethasone;Cryotherapy;Electrical Stimulation;Ultrasound;Moist Heat;Therapeutic exercise;Therapeutic activities;Neuromuscular re-education;Manual techniques;Dry needling;Taping    PT Next Visit Plan  ionto #4; d/n Lt quad/glutes if needed; Continue hip strengthening progression: standing hip abduction, leg press progression    PT Home Exercise Plan  [facility]       Patient will benefit from skilled therapeutic intervention in order to improve the following deficits and impairments:  Increased fascial restricitons, Increased muscle spasms, Pain, Decreased strength  Visit Diagnosis: Pain in left hip  Muscle weakness (generalized)  Cramp and spasm     Problem List Patient Active Problem List   Diagnosis Date Noted  . Trochanteric bursitis, left hip 01/10/2020  . Hypothyroid 02/12/2016    11:51 AM,02/27/20 Sherol Dade PT, DPT [facility] [address] [place], [place], [address] Phone: [phone]   Fax:  [phone]  Name: Natasha Norman MRN: [id number] Date of Birth: [birth year]

## 2020-02-29 ENCOUNTER — Ambulatory Visit: Payer: Medicare Other | Admitting: Physical Therapy

## 2020-02-29 ENCOUNTER — Other Ambulatory Visit: Payer: Self-pay

## 2020-02-29 DIAGNOSIS — M6281 Muscle weakness (generalized): Secondary | ICD-10-CM

## 2020-02-29 DIAGNOSIS — R252 Cramp and spasm: Secondary | ICD-10-CM

## 2020-02-29 DIAGNOSIS — M25552 Pain in left hip: Secondary | ICD-10-CM

## 2020-02-29 NOTE — Therapy (Signed)
[facility] [address] [place], [place], [address] Phone: [phone]   Fax:  [phone]  Physical Therapy Treatment  Patient Details  Name: Natasha Norman MRN: [id number] Date of Birth: 05-28-1951 Referring Provider (PT): Dr. [name]   Encounter Date: 02/29/2020  PT End of Session - 02/29/20 1340    Visit Number  9    Date for PT Re-Evaluation  03/20/20    Authorization Type  Medicare 10th visit progress note    PT Start Time  0930    PT Stop Time  1015    PT Time Calculation (min)  45 min    Activity Tolerance  Patient tolerated treatment well       Past Medical History:  Diagnosis Date  . Cancer ([place]) 1996   Melanoma  . Chicken pox   . H/O repair of rotator cuff    Right   . Hypothyroidism   . Osteopenia   . Seasonal allergies     Past Surgical History:  Procedure Laterality Date  . BREAST BIOPSY Right 1984, 1996   Benign  . lymph node resection  1998    There were no vitals filed for this visit.  Subjective Assessment - 02/29/20 0935    Subjective  I'm about the same.  I get up in the morning feeling better.  Some nights are bad.  I don't understand why I have this.  What caused this?    Pertinent History  right knee pain; osteopenia    How long can you walk comfortably?  2-3 miles    Currently in Pain?  No/denies    Pain Score  0-No pain    Pain Location  Hip    Pain Orientation  Left    Pain Type  Chronic pain                        [facility] Adult PT Treatment/Exercise - 02/29/20 0001      Therapeutic Activites    Other Therapeutic Activities  long discussion on physiology of muscle strengthening, causes of this problem, how to reduce future recurrences       Knee/Hip Exercises: Standing   Hip Abduction  AROM;Right;Left;5 reps    Hip Extension  AROM;Right;Left;5 reps    Other Standing Knee Exercises  simulation of floor sliders using towel on floor 3 directions        Iontophoresis   Type of Iontophoresis  Dexamethasone   #3   Location  left inferior hip greater trochanter     Dose  4 mg/ml    Time  4-6 hour patch       Manual Therapy   Soft tissue mobilization  left gluteals, piriformis, lateral quads, HS        Trigger Point Dry Needling - 02/29/20 0001    Consent Given?  Yes    Vastus lateralis Response  Twitch response elicited;Palpable increased muscle length    Hamstring Response  Palpable increased muscle length    Gluteus Minimus Response  Twitch response elicited;Palpable increased muscle length    Gluteus Medius Response  Twitch response elicited;Palpable increased muscle length    Gluteus Maximus Response  Twitch response elicited;Palpable increased muscle length    Piriformis Response  Palpable increased muscle length             PT Short Term Goals - 02/27/20 1139      PT SHORT TERM GOAL #1  Title  The patient will demonstrate knowledge of basic self care strategies and initial HEP    Status  Achieved      PT SHORT TERM GOAL #2   Title  The patient will report a 30% decrease in left hip pain with walking 45 minutes and with rising sit to stand    Baseline  25% improved    Time  4    Period  Weeks    Status  On-going        PT Long Term Goals - 01/24/20 1728      PT LONG TERM GOAL #1   Title  The patient will be independent in safe self progression of HEP    Time  8    Period  Weeks    Status  New    Target Date  03/20/20      PT LONG TERM GOAL #2   Title  The patient will report a 60% decreased in left hip pain with walking 45 minutes and with rising sit to stand    Time  8    Period  Weeks    Status  New      PT LONG TERM GOAL #3   Title  The patient will have 4+/5 to 5-/5  left hip abduction strength needed to eliminate pelvic drop with single limb stance or prolonged standing    Time  8    Period  Weeks    Status  New      PT LONG TERM GOAL #4   Title  FOTO functional outcome score  improved from 33% to 22% indicating improved function with less pain    Time  8    Period  Weeks    Status  New            Plan - 02/29/20 1340    Clinical Impression Statement  The patient expresses frustration in variable status, lack of understanding on why this problem occurred and how to prevent future occurrences.  Extensive time spent discussing this based on what medical evidence is known related to strength training, myofascial pain and neuroscience of pain principles.  She has myofascial tender points in gluteals, piriformis and lateral quads, lateral HS.  Reduced size and number following DN and manual therapy.  She demonstrates good understanding and compliance with current HEP.  Therapist closely monitoring response with all interventions.    Comorbidities  osteopenia    Examination-Activity Limitations  Locomotion Level;Transfers    Rehab Potential  Good    PT Frequency  2x / week    PT Duration  8 weeks    PT Treatment/Interventions  ADLs/Self Care Home Management;Iontophoresis 4mg /ml Dexamethasone;Cryotherapy;Electrical Stimulation;Ultrasound;Moist Heat;Therapeutic exercise;Therapeutic activities;Neuromuscular re-education;Manual techniques;Dry needling;Taping    PT Next Visit Plan  10th visit progress note;  recheck FOTO; MMT; progress toward goals;  hip strengthening;  pt going out of town for 2 weeks later this month    PT [place]  [facility]       Patient will benefit from skilled therapeutic intervention in order to improve the following deficits and impairments:  Increased fascial restricitons, Increased muscle spasms, Pain, Decreased strength  Visit Diagnosis: Pain in left hip  Muscle weakness (generalized)  Cramp and spasm     Problem List Patient Active Problem List   Diagnosis Date Noted  . Trochanteric bursitis, left hip 01/10/2020  . Hypothyroid 02/12/2016   Ruben Im, PT 02/29/20 1:50 PM Phone: [phone] Fax:  [phone] Alvera Singh  02/29/2020, 1:50 PM  [facility] [address], [place], [place], [address] Phone: [phone]   Fax:  [phone]  Name: Natasha Norman MRN: [id number] Date of Birth: 07-19-1951

## 2020-03-04 ENCOUNTER — Other Ambulatory Visit: Payer: Self-pay

## 2020-03-04 ENCOUNTER — Ambulatory Visit: Payer: Medicare Other | Admitting: Physical Therapy

## 2020-03-04 DIAGNOSIS — M6281 Muscle weakness (generalized): Secondary | ICD-10-CM

## 2020-03-04 DIAGNOSIS — R252 Cramp and spasm: Secondary | ICD-10-CM

## 2020-03-04 DIAGNOSIS — M25552 Pain in left hip: Secondary | ICD-10-CM | POA: Diagnosis not present

## 2020-03-04 NOTE — Therapy (Signed)
[facility] [address], [place], [place], [address] Phone: [phone]   Fax:  [phone]  Physical Therapy Treatment  Patient Details  Name: Natasha Norman MRN: [id number] Date of Birth: 12-11-1950 Referring Provider (PT): Dr. [name]  Progress Note Reporting Period 01/21/20 to 03/04/20  See note below for Objective Data and Assessment of Progress/Goals.      Encounter Date: 03/04/2020  PT End of Session - 03/04/20 0938    Visit Number  10    Date for PT Re-Evaluation  03/20/20    Authorization Type  Medicare 10th visit progress note    PT Start Time  0932    PT Stop Time  0092    PT Time Calculation (min)  42 min    Activity Tolerance  Patient tolerated treatment well    Behavior During Therapy  WFL for tasks assessed/performed       Past Medical History:  Diagnosis Date   Cancer ([place]) 1996   Melanoma   Chicken pox    H/O repair of rotator cuff    Right    Hypothyroidism    Osteopenia    Seasonal allergies     Past Surgical History:  Procedure Laterality Date   BREAST BIOPSY Right 1984, 1996   Benign   lymph node resection  1998    There were no vitals filed for this visit.  Subjective Assessment - 03/04/20 1245    Subjective  Pt states things are better but it is still variable.    Pertinent History  right knee pain; osteopenia    How long can you walk comfortably?  2-3 miles    Currently in Pain?  No/denies   none at rest        [facility] PT Assessment - 03/04/20 0001      Assessment   Medical Diagnosis  left hip pain    Referring Provider (PT)  Dr. [name]    Onset Date/Surgical Date  --   December   Next MD Visit  02/18/20    Prior Therapy  shoulder PT 7-8 years ago      Precautions   Precautions  None      Restrictions   Weight Bearing Restrictions  No      Balance Screen   Has the patient fallen in the past 6 months  No    Has the patient had a decrease  in activity level because of a fear of falling?   No    Is the patient reluctant to leave their home because of a fear of falling?   No      Home Environment   Living Environment  Private residence    Living Arrangements  Spouse/significant other    Type of [place] to enter    [place]  One level      Prior Function   Level of [place]  Retired    Leisure  traveling; walking; reading, crochet; learning to [employer]       Observation/Other Assessments   Focus on Therapeutic Outcomes (FOTO)   9% limited      AROM   Overall AROM Comments  full Lumbar ROM;  full hip ROM bil no pain except at endrange left hip external rotation       Strength   Right Hip Flexion  5/5    Right Hip Extension  5/5  Right Hip External Rotation   5/5    Right Hip ABduction  5/5    Left Hip Flexion  5/5    Left Hip Extension  5/5    Left Hip External Rotation  4+/5    Left Hip ABduction  4+/5      Flexibility   Hamstrings  WNLs    Quadriceps  WNL    ITB  decreased       Palpation   SI assessment   point tender over left hip greater trochanter     Palpation comment  Numerous tender points and sensitivity in left gluteals, piriformis, lateral quads, lateral HS and ITB      Special Tests   Other special tests  uncomfortable on Lt side       Trendelenburg Test   Comments  WNL, 5+ sec bilaterally       Hip Scouring   Findings  Negative    Side  Left                    [facility] Adult PT Treatment/Exercise - 03/04/20 0001      Knee/Hip Exercises: Stretches   Other Knee/Hip Stretches  long sitting hip 90/90 ER and IR BLE x15 reps       Knee/Hip Exercises: Aerobic   Nustep  L2 x5 min PT present to discuss progress       Knee/Hip Exercises: Machines for Strengthening   Other Machine  #15 sport cord walking forward/backwards holding handles x10 reps       Knee/Hip Exercises: Supine   Other Supine Knee/Hip Exercises  Lt  clamshell isometric hold at various degress of abduction 8x5 sec hold              PT Education - 03/04/20 1248    Education Details  technique with therex    Person(s) Educated  Patient    Methods  Explanation;Verbal cues    Comprehension  Verbalized understanding;Returned demonstration       PT Short Term Goals - 02/27/20 1139      PT SHORT TERM GOAL #1   Title  The patient will demonstrate knowledge of basic self care strategies and initial HEP    Status  Achieved      PT SHORT TERM GOAL #2   Title  The patient will report a 30% decrease in left hip pain with walking 45 minutes and with rising sit to stand    Baseline  25% improved    Time  4    Period  Weeks    Status  On-going        PT Long Term Goals - 03/04/20 8916      PT LONG TERM GOAL #1   Title  The patient will be independent in safe self progression of HEP    Time  8    Period  Weeks    Status  New      PT LONG TERM GOAL #2   Title  The patient will report a 60% decreased in left hip pain with walking 45 minutes and with rising sit to stand    Baseline  no pain with sit to stand once foot position was adjusted; walking pain intensity does not increase like it used to; but this still bothers her when walking in general    Time  8    Period  Weeks    Status  Partially Met      PT LONG TERM GOAL #  3   Title  The patient will have 4+/5 to 5-/5  left hip abduction strength needed to eliminate pelvic drop with single limb stance or prolonged standing    Baseline  4+/5 MMT    Time  8    Period  Weeks    Status  Achieved      PT LONG TERM GOAL #4   Title  FOTO functional outcome score improved from 33% to 22% indicating improved function with less pain    Baseline  9% improved    Time  8    Period  Weeks    Status  Achieved            Plan - 03/04/20 1249    Clinical Impression Statement  Pt is making progress towards her goals. Her FOTO score improved from 33% limitation down to 9%  limitation. Pt is walking 2-3 miles at a time and still has pain with this, but she notes that her pain is not increased during or following her walk compared to several weeks ago. Pt can demonstrate pain free sit to stand today but she requires PT instruction to avoid narrow stance and excessive knee valgus. Pts Lt hip abductor strength is now 4+/5 MMT but painful. Session continued with focus on increasing glute strength. Pt had slight increase in hip discomfort with weight bearing following the session, likely due to reassessment of strength/goals. She has 1 more appointment and then leaves for out of town. She would benefit from skilled PT moving forward to address remaining limitations in myofascial trigger points and increase Lt hip strength and endurance.    Comorbidities  osteopenia    Examination-Activity Limitations  Locomotion Level;Transfers    Rehab Potential  Good    PT Frequency  2x / week    PT Duration  8 weeks    PT Treatment/Interventions  ADLs/Self Care Home Management;Iontophoresis 52m/ml Dexamethasone;Cryotherapy;Electrical Stimulation;Ultrasound;Moist Heat;Therapeutic exercise;Therapeutic activities;Neuromuscular re-education;Manual techniques;Dry needling;Taping    PT Next Visit Plan  progress hip strengthening;  pt going out of town for 2 weeks later this month: discuss new activities she will be doing and possible modifications to avoid exacerbation    PT [facility] Exercise Plan  [facility]      Patient will benefit from skilled therapeutic intervention in order to improve the following deficits and impairments:  Increased fascial restricitons, Increased muscle spasms, Pain, Decreased strength  Visit Diagnosis: Pain in left hip  Muscle weakness (generalized)  Cramp and spasm     Problem List Patient Active Problem List   Diagnosis Date Noted   Trochanteric bursitis, left hip 01/10/2020   Hypothyroid 02/12/2016    12:55 PM,03/04/20 SSherol DadePT, DPT [place] [address] [place] [address]Phone: [phone]  Fax:  [phone] Name: DMaraya GwilliamMRN: [id number] of Birth: 101-19-1952

## 2020-03-06 ENCOUNTER — Other Ambulatory Visit: Payer: Self-pay

## 2020-03-06 ENCOUNTER — Ambulatory Visit: Payer: Medicare Other | Admitting: Physical Therapy

## 2020-03-06 DIAGNOSIS — M25552 Pain in left hip: Secondary | ICD-10-CM | POA: Diagnosis not present

## 2020-03-06 DIAGNOSIS — R252 Cramp and spasm: Secondary | ICD-10-CM

## 2020-03-06 DIAGNOSIS — M6281 Muscle weakness (generalized): Secondary | ICD-10-CM

## 2020-03-06 NOTE — Therapy (Addendum)
[facility] [address], [place], [place], [address] Phone: [phone]   Fax:  [phone]  Physical Therapy Treatment/Discharge Summary   Patient Details  Name: Natasha Norman MRN: [id number] Date of Birth: 1951/07/09 Referring Provider (PT): Dr. [name]   Encounter Date: 03/06/2020   PT End of Session - 03/06/20 1735    Visit Number 11    Date for PT Re-Evaluation 03/20/20    Authorization Type Medicare 10th visit progress note    PT Start Time 1400    PT Stop Time 1442    PT Time Calculation (min) 42 min    Activity Tolerance Patient tolerated treatment well           Past Medical History:  Diagnosis Date  . Cancer ([place]) 1996   Melanoma  . Chicken pox   . H/O repair of rotator cuff    Right   . Hypothyroidism   . Osteopenia   . Seasonal allergies     Past Surgical History:  Procedure Laterality Date  . BREAST BIOPSY Right 1984, 1996   Benign  . lymph node resection  1998    There were no vitals filed for this visit.   Subjective Assessment - 03/06/20 1401    Subjective Yesterday and last night I was more sore.  Had a good response from DN last week.  No pain right this minute but I've been feeling like all day.  Going out of town for 2 weeks.  I see the doctor around the 4th.    Pertinent History right knee pain; osteopenia    Currently in Pain? No/denies    Pain Score 0-No pain    Pain Location Hip    Pain Orientation Left                             [facility] Adult PT Treatment/Exercise - 03/06/20 0001      Self-Care   Self-Care Other Self-Care Comments    Other Self-Care Comments  discussed walking program;  home TENs info       Knee/Hip Exercises: Aerobic   Nustep L2 x7 min PT present to discuss progress       Moist Heat Therapy   Number Minutes Moist Heat 3 Minutes    Moist Heat Location Hip      Electrical Stimulation   Electrical Stimulation Location  left lateral thigh     Electrical Stimulation Action with DN     Electrical Stimulation Parameters 1 V    Electrical Stimulation Goals Pain      Iontophoresis   Type of Iontophoresis Dexamethasone    Location left inferior hip greater trochanter     Dose 4 mg/ml    Time 4-6 hour patch       Manual Therapy   Soft tissue mobilization left gluteals, piriformis, lateral quads, HS             Trigger Point Dry Needling - 03/06/20 0001    Consent Given? Yes    Electrical Stimulation Performed with Dry Needling Yes    E-stim with Dry Needling Details 8 min to lateral quads, HS     Vastus lateralis Response Twitch response elicited;Palpable increased muscle length    Hamstring Response Palpable increased muscle length    Gluteus Minimus Response Twitch response elicited;Palpable increased muscle length    Gluteus Medius Response Twitch response elicited;Palpable increased muscle length  Gluteus Maximus Response Twitch response elicited;Palpable increased muscle length    Piriformis Response Palpable increased muscle length                PT Education - 03/06/20 1433    Education Details home TENS info    Person(s) Educated Patient    Methods Explanation;Demonstration;Handout    Comprehension Returned demonstration;Verbalized understanding            PT Short Term Goals - 02/27/20 1139      PT SHORT TERM GOAL #1   Title The patient will demonstrate knowledge of basic self care strategies and initial HEP    Status Achieved      PT SHORT TERM GOAL #2   Title The patient will report a 30% decrease in left hip pain with walking 45 minutes and with rising sit to stand    Baseline 25% improved    Time 4    Period Weeks    Status On-going             PT Long Term Goals - 03/04/20 0943      PT LONG TERM GOAL #1   Title The patient will be independent in safe self progression of HEP    Time 8    Period Weeks    Status New      PT LONG TERM GOAL #2   Title The  patient will report a 60% decreased in left hip pain with walking 45 minutes and with rising sit to stand    Baseline no pain with sit to stand once foot position was adjusted; walking pain intensity does not increase like it used to; but this still bothers her when walking in general    Time 8    Period Weeks    Status Partially Met      PT LONG TERM GOAL #3   Title The patient will have 4+/5 to 5-/5  left hip abduction strength needed to eliminate pelvic drop with single limb stance or prolonged standing    Baseline 4+/5 MMT    Time 8    Period Weeks    Status Achieved      PT LONG TERM GOAL #4   Title FOTO functional outcome score improved from 33% to 22% indicating improved function with less pain    Baseline 9% improved    Time 8    Period Weeks    Status Achieved                 Plan - 03/06/20 1735    Clinical Impression Statement The patient continues to have myofascial tender points in posterior hip muscles and lateral thigh musculature.  She is quite sensitive in these areas but improved soft tissue length and decreased tender point size and number following treatment session.  Discussed home option of home TENs unit for pain control.  She reports and demonstrates good compliance with her HEP.   She is out of town for > 2 weeks and then plans to follow up with her doctor.  She will call with a status update in July.    Comorbidities osteopenia    Examination-Activity Limitations Locomotion Level;Transfers    Rehab Potential Good    PT Frequency 2x / week    PT Duration 8 weeks    PT Treatment/Interventions ADLs/Self Care Home Management;Iontophoresis 62m/ml Dexamethasone;Cryotherapy;Electrical Stimulation;Ultrasound;Moist Heat;Therapeutic exercise;Therapeutic activities;Neuromuscular re-education;Manual techniques;Dry needling;Taping    PT Next Visit Plan pt to call with status update after out  of town trip and follow up with the doctor in July    [place]  [facility]           Patient will benefit from skilled therapeutic intervention in order to improve the following deficits and impairments:  Increased fascial restricitons, Increased muscle spasms, Pain, Decreased strength  Visit Diagnosis: Pain in left hip  Muscle weakness (generalized)  Cramp and spasm  PHYSICAL THERAPY DISCHARGE SUMMARY  Visits from Start of Care: 11  Current functional level related to goals / functional outcomes: The patient cancelled her last scheduled appt secondary to going out of town.  Her PT chart has been inactive since June.  Will discharge from PT with partial goals met.  If needed we would be happy to restart PT with a new MD order.     Remaining deficits: As above   Education / Equipment: HEP Plan:                                                    Patient goals were not met. Patient is being discharged due to not returning since the last visit.  ?????         Problem List Patient Active Problem List   Diagnosis Date Noted  . Trochanteric bursitis, left hip 01/10/2020  . Hypothyroid 02/12/2016   Ruben Im, PT 03/06/20 5:41 PM Phone: [phone] Fax: [phone] Alvera Singh 03/06/2020, 5:41 PM  [facility] [address] [place], [place], [address] Phone: [phone]   Fax:  [phone]  Name: Natasha Norman MRN: [id number] Date of Birth: October 04, 1950

## 2020-03-06 NOTE — Patient Instructions (Signed)
   TENS UNIT  This is helpful for muscle pain and spasm.   Search and Purchase a TENS 7000 2nd edition at www.tenspros.com or www.amazon.com  (It should be less than $30)     TENS unit instructions:   Do not shower or bathe with the unit on  Turn the unit off before removing electrodes or batteries  If the electrodes lose stickiness add a drop of water to the electrodes after they are disconnected from the unit and place on plastic sheet. If you continued to have difficulty, call the TENS unit company to purchase more electrodes.  Do not apply lotion on the skin area prior to use. Make sure the skin is clean and dry as this will help prolong the life of the electrodes.  After use, always check skin for unusual red areas, rash or other skin difficulties. If there are any skin problems, does not apply electrodes to the same area.  Never remove the electrodes from the unit by pulling the wires.  Do not use the TENS unit or electrodes other than as directed.  Do not change electrode placement without consulting your therapist or physician.  Keep 2 fingers with between each electrode.   Alaila Pillard PT [facility] [address], [address] [place], [place] [address] Phone # [phone] Fax [phone] 

## 2020-03-14 ENCOUNTER — Ambulatory Visit: Payer: Medicare Other | Admitting: Physical Therapy

## 2020-04-01 ENCOUNTER — Ambulatory Visit (INDEPENDENT_AMBULATORY_CARE_PROVIDER_SITE_OTHER): Payer: Medicare Other | Admitting: Family Medicine

## 2020-04-01 ENCOUNTER — Ambulatory Visit: Payer: Self-pay

## 2020-04-01 ENCOUNTER — Other Ambulatory Visit: Payer: Self-pay

## 2020-04-01 ENCOUNTER — Encounter: Payer: Self-pay | Admitting: Family Medicine

## 2020-04-01 VITALS — BP 138/74 | HR 94 | Ht 65.0 in | Wt 132.4 lb

## 2020-04-01 DIAGNOSIS — M7062 Trochanteric bursitis, left hip: Secondary | ICD-10-CM

## 2020-04-01 NOTE — Patient Instructions (Signed)
Thank you for coming in today. Plan for MRI.  Recheck following MRI.  Typically schedule after 24 hours after the MRI.

## 2020-04-01 NOTE — Progress Notes (Signed)
   I, Wendy Poet, LAT, ATC, am serving as scribe for Dr. [name].  Natasha Norman is a 69 y.o. female who presents to [facility] today for f/u of L lateral hip pain.  She was last seen by Dr. Georgina Snell on 02/18/20 and was advised to con't PT of which she has completed 11 visits.  Since her last visit, pt reports that her L hip is better but the pain is not gone.  Pt reports her improvement at approximately 75%.  She con't to have pain w/ walking and w/ transitioning from sit-to-stand.  Diagnostic imaging: L hip XR- 12/14/19   Pertinent review of systems: No fevers or chills  Relevant historical information: Hypothyroidism, osteopenia.   Exam:  BP 138/74 (BP Location: Right Arm, Patient Position: Sitting, Cuff Size: Normal)   Pulse 94   Ht 5\' 5"  (1.651 m)   Wt 132 lb 6.4 oz (60.1 kg)   SpO2 98%   BMI 22.03 kg/m  General: Well Developed, well nourished, and in no acute distress.   MSK: Left hip normal-appearing Normal motion. Tender palpation greater trochanter. Hip abduction strength diminished 4+/5 with pain. External rotation strength diminished 4+/5 with pain. Leg lengths are equal.    Lab and Radiology Results  EXAM: DG HIP (WITH OR WITHOUT PELVIS) 2-3V LEFT  COMPARISON:  None.  FINDINGS: No fracture or bone lesion.  Hip joints, SI joints and symphysis pubis are normally spaced and aligned. No arthropathic changes.  Normal soft tissues.  IMPRESSION: Negative.   Electronically Signed   By: Lajean Manes M.D.   On: 12/14/2019 11:09   I, [name], personally (independently) visualized and performed the interpretation of the images attached in this note.     Assessment and Plan: 69 y.o. female with persistent left lateral hip pain thought to be trochanteric bursitis.  Improved but not resolved with great trial of physical therapy.  Pain ongoing now for almost 4 months.  After discussion with patient will  proceed with MRI to further characterize cause of pain and dictate treatment from there.  Patient is willing to proceed with injection or other more invasive treatment options if needed following MRI. Recheck after MRI.   PDMP not reviewed this encounter. Orders Placed This Encounter  Procedures  . MR HIP LEFT WO CONTRAST    Standing Status:   Future    Standing Expiration Date:   04/01/2021    Order Specific Question:   ** REASON FOR EXAM (FREE TEXT)    Answer:   left hip trochanteric burisits failling conservative managment.    Order Specific Question:   What is the patient's sedation requirement?    Answer:   No Sedation    Order Specific Question:   Does the patient have a pacemaker or implanted devices?    Answer:   No    Order Specific Question:   Preferred imaging location?    Answer:   Product/process development scientist (table limit-350lbs)    Order Specific Question:   Radiology Contrast Protocol - do NOT remove file path    Answer:   \\[email or web address]   No orders of the defined types were placed in this encounter.    Discussed warning signs or symptoms. Please see discharge instructions. Patient expresses understanding.   The above documentation has been reviewed and is accurate and complete [name], M.D.

## 2020-04-13 ENCOUNTER — Ambulatory Visit (INDEPENDENT_AMBULATORY_CARE_PROVIDER_SITE_OTHER): Payer: Medicare Other

## 2020-04-13 ENCOUNTER — Other Ambulatory Visit: Payer: Self-pay

## 2020-04-13 DIAGNOSIS — M1612 Unilateral primary osteoarthritis, left hip: Secondary | ICD-10-CM | POA: Diagnosis not present

## 2020-04-13 DIAGNOSIS — M7062 Trochanteric bursitis, left hip: Secondary | ICD-10-CM | POA: Diagnosis not present

## 2020-04-13 IMAGING — MR MR HIP*L* W/O CM
5 series · 30 of 40 positions shown · non-contrast
Comparison: None.

CLINICAL DATA: Lateral left hip pain for 7-8 months. No specific
injury.

EXAM:
MR OF THE LEFT HIP WITHOUT CONTRAST
TECHNIQUE: Multiplanar, multisequence MR imaging was performed. No intravenous
contrast was administered.

[Series 5: STIR · axial · 4.0mm · 0.70mm/px · z∈[-60,+85]mm · 8 of 30 slices shown (1 of 2)]
[im 1/30]
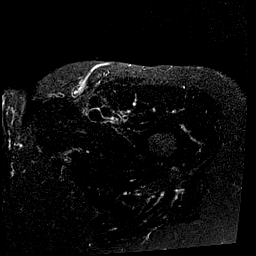
[im 5/30]
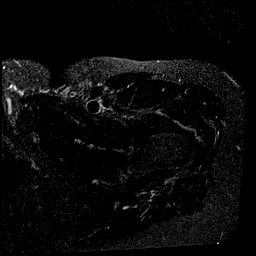
[im 9/30]
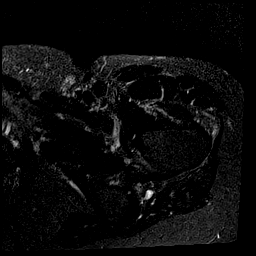
[im 13/30]
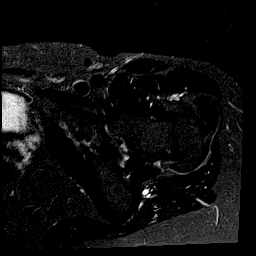
[im 17/30]
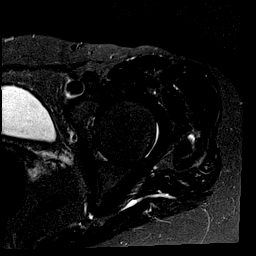
[im 21/30]
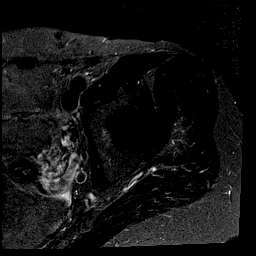
[im 25/30]
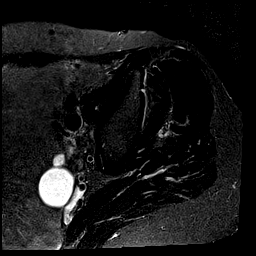
[im 30/30]
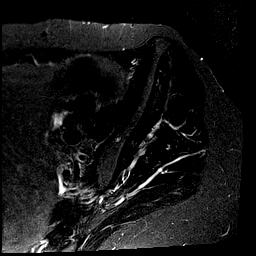

[Series 6: PD fat-sat · sagittal · 4.5mm · 0.35mm/px · 6 of 23 slices shown (1 of 2)]
[im 1/23]
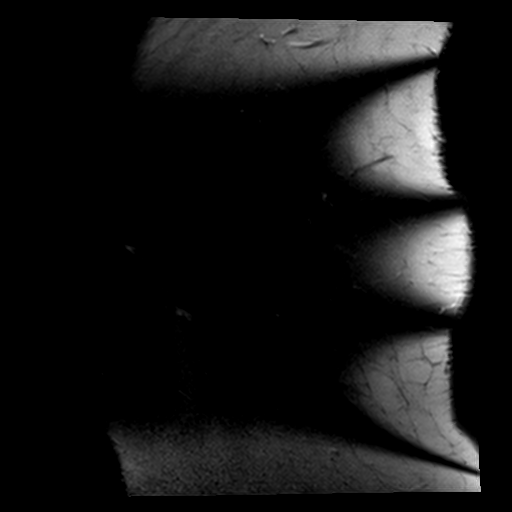
[im 5/23]
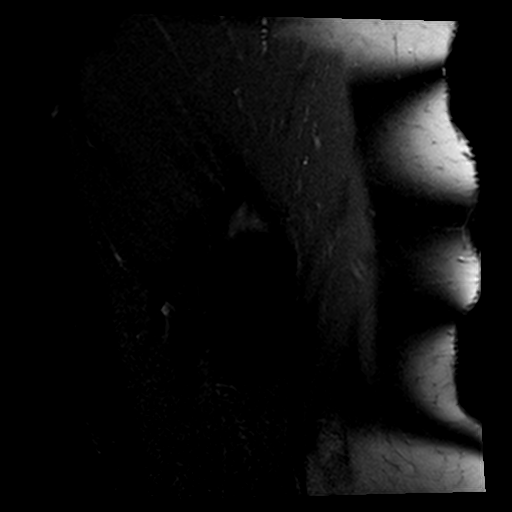
[im 9/23]
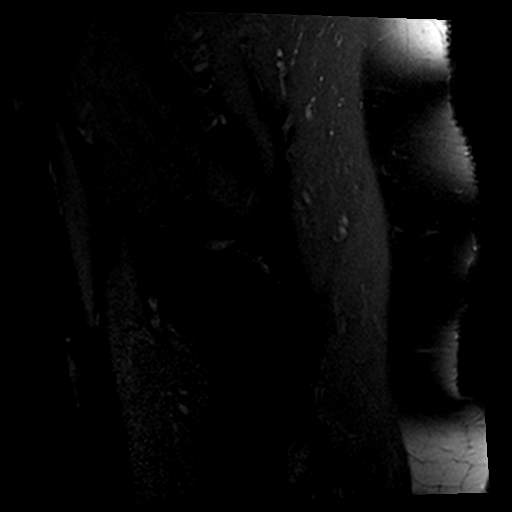
[im 14/23]
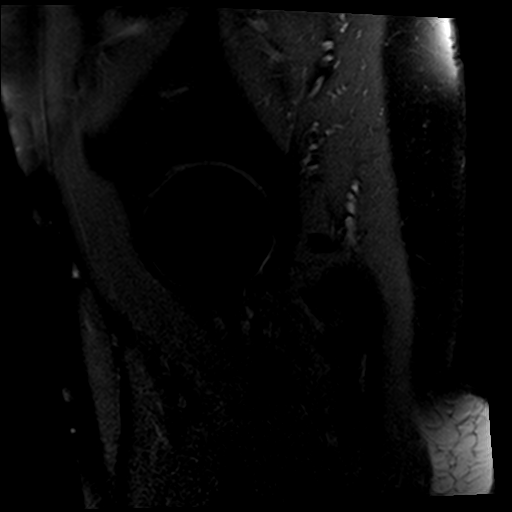
[im 18/23]
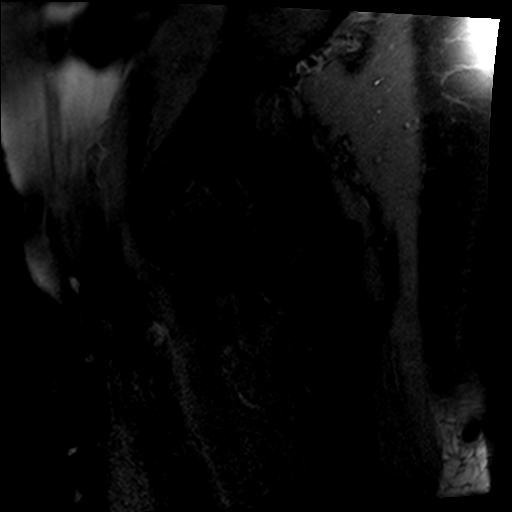
[im 23/23]
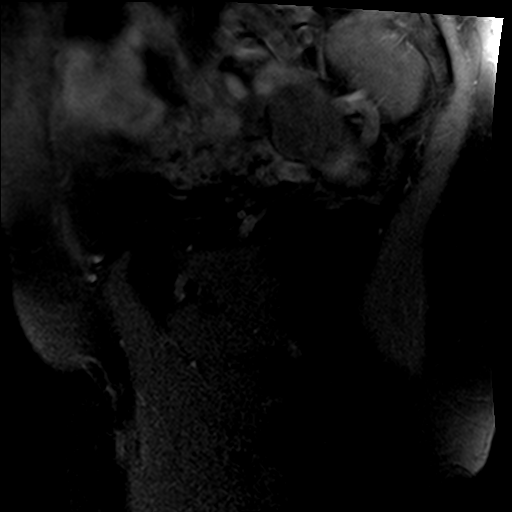

[Series 7: PD fat-sat · coronal · 4.5mm · 0.35mm/px · 6 of 23 slices shown (2 of 2)]
[im 1/23]
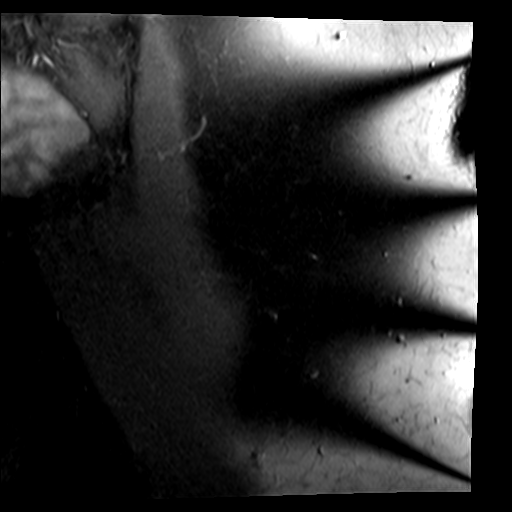
[im 5/23]
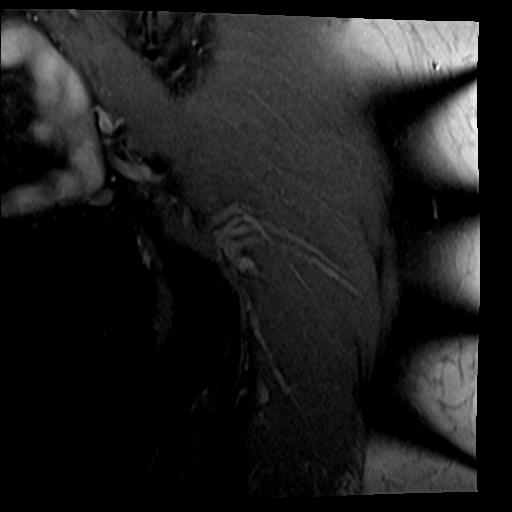
[im 9/23]
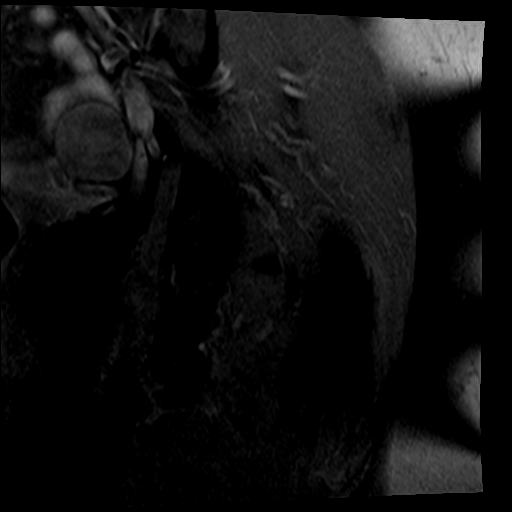
[im 14/23]
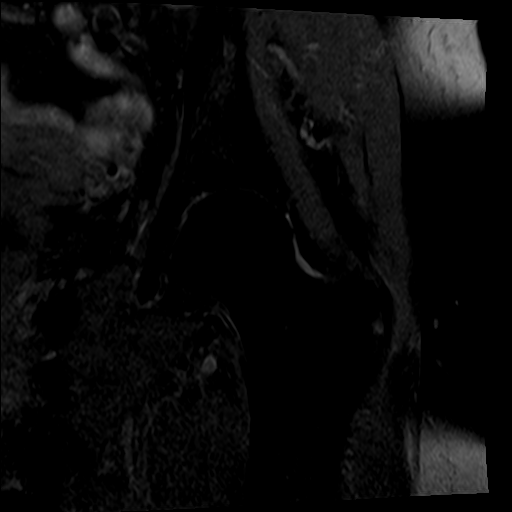
[im 18/23]
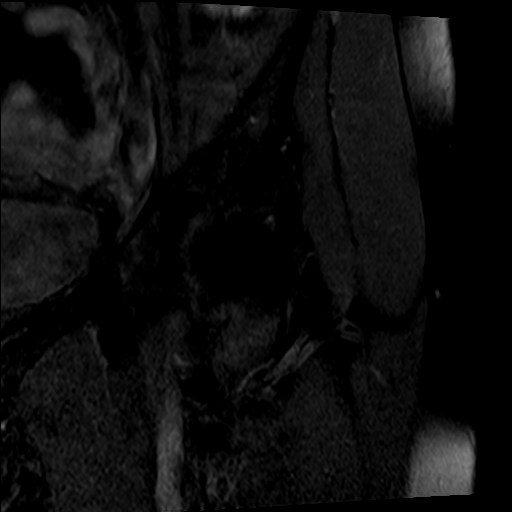
[im 23/23]
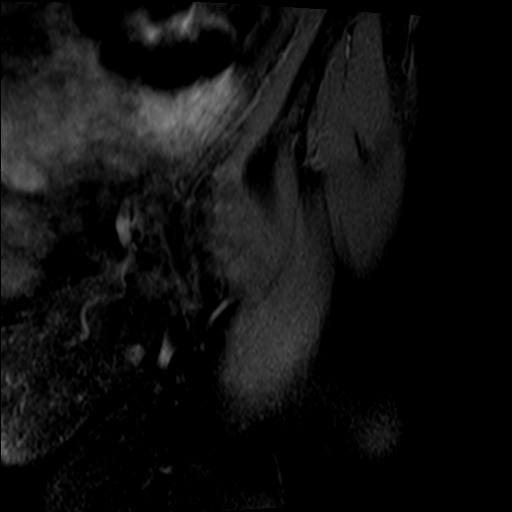

[Series 8: T1 · coronal · 4.0mm · 0.85mm/px · 8 of 35 slices shown]
[im 1/35]
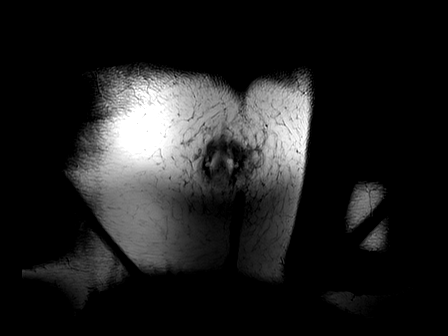
[im 4/35]
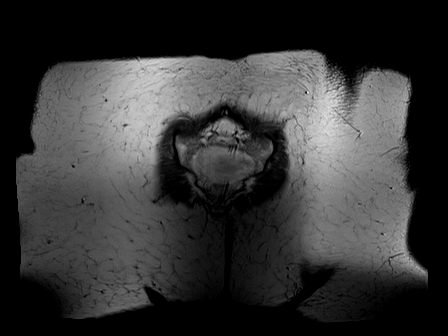
[im 12/35]
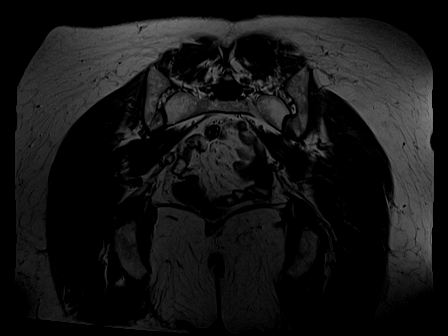
[im 16/35]
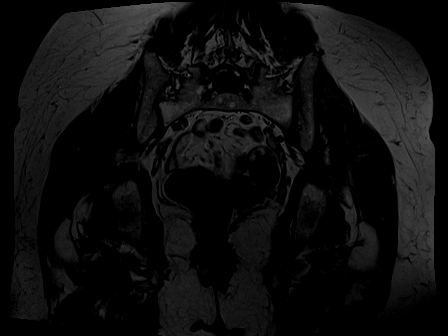
[im 19/35]
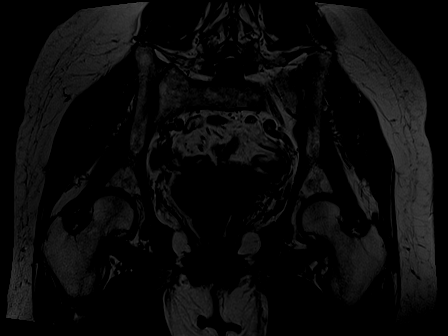
[im 23/35]
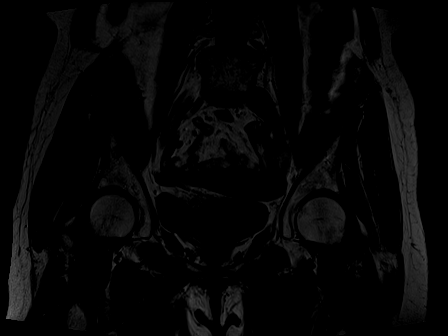
[im 31/35]
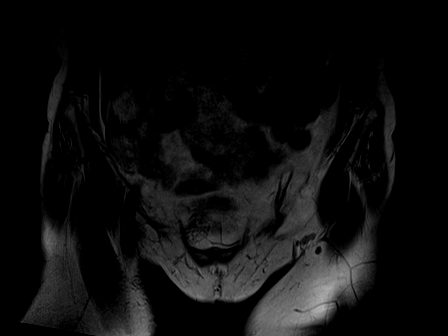
[im 35/35]
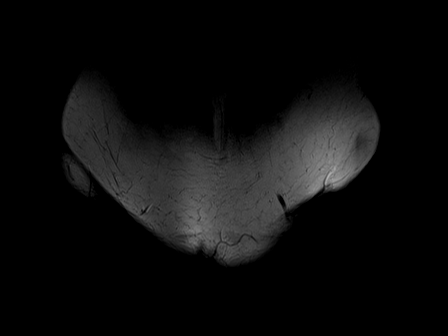

[Series 9: STIR · coronal · 4.0mm · 1.19mm/px · 2 of 35 slices shown (2 of 2)]
[im 1/35]
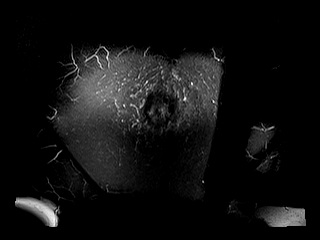
[im 4/35]
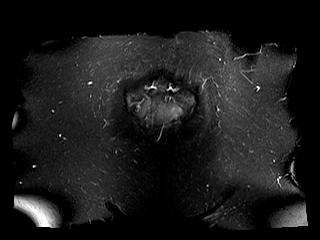

[30 of 40 positions shown; findings below may reference images not displayed]

FINDINGS: Both hips are normally located. Mild to moderate hip joint
degenerative changes bilaterally. No findings for stress fracture or
AVN. No hip joint effusion. No periarticular fluid collections to
suggest a paralabral cyst.

Left anterior superior labral tear is suspected on the sagittal
images.

Mild bilateral peritendinosis, left greater than right and
associated mild left trochanteric bursitis. The hip and pelvic
musculature are unremarkable. No muscle tear, myositis or mass.

The pubic symphysis and SI joints are intact. No pelvic fractures or
bone lesions.

No significant intrapelvic abnormalities are identified. There is a
2.5 cm simple appearing left adnexal cyst noted.
IMPRESSION: 1. Mild to moderate bilateral hip joint degenerative changes
bilaterally. No findings for stress fracture or AVN.
2. Anterior superior labral tear suspected on the left.
3. Mild bilateral peritendinosis, left greater than right and
associated mild left trochanteric bursitis.

## 2020-04-15 ENCOUNTER — Encounter: Payer: Self-pay | Admitting: Family Medicine

## 2020-04-15 ENCOUNTER — Ambulatory Visit: Payer: Self-pay

## 2020-04-15 ENCOUNTER — Ambulatory Visit (INDEPENDENT_AMBULATORY_CARE_PROVIDER_SITE_OTHER): Payer: Medicare Other | Admitting: Family Medicine

## 2020-04-15 ENCOUNTER — Other Ambulatory Visit: Payer: Self-pay

## 2020-04-15 VITALS — BP 102/72 | HR 82 | Ht 65.0 in | Wt 130.2 lb

## 2020-04-15 DIAGNOSIS — M7062 Trochanteric bursitis, left hip: Secondary | ICD-10-CM

## 2020-04-15 NOTE — Patient Instructions (Signed)
Thank you for coming in today. Call or go to the ER if you develop a large red swollen joint with extreme pain or oozing puss.  Continue exercises.  Recheck with me as needed.

## 2020-04-15 NOTE — Progress Notes (Signed)
I, Wendy Poet, LAT, ATC, am serving as scribe for Dr. [name].  Natasha Norman is a 69 y.o. female who presents to [place] at [facility] today for f/u of L hip pain and L hip MRI review.  She was last seen by Dr. Georgina Snell on 04/01/20 and noted 75% improvement but was still having pain w/ walking and sit-to-stand transitions.  She has completed 11 PT sessions.  Since her last visit, pt reports that her L hip remains about the same.  She has been d/c from PT.  Diagnostic imaging: L hip MRI- 04/13/20; L hip XR- 12/14/19   Pertinent review of systems: No fevers or chills  Relevant historical information: Hypothyroidism   Exam:  BP 102/72 (BP Location: Right Arm, Patient Position: Sitting, Cuff Size: Normal)   Pulse 82   Ht 5\' 5"  (1.651 m)   Wt 130 lb 3.2 oz (59.1 kg)   SpO2 99%   BMI 21.67 kg/m  General: Well Developed, well nourished, and in no acute distress.   MSK: Left hip normal-appearing tender palpation greater trochanter.  Slight diminished hip abduction strength. Normal hip motion.    Lab and Radiology Results No results found for this or any previous visit (from the past 72 hour(s)). MR HIP LEFT WO CONTRAST  Result Date: 04/15/2020 CLINICAL DATA:  Lateral left hip pain for 7-8 months. No specific injury. EXAM: MR OF THE LEFT HIP WITHOUT CONTRAST TECHNIQUE: Multiplanar, multisequence MR imaging was performed. No intravenous contrast was administered. COMPARISON:  None. FINDINGS: Both hips are normally located. Mild to moderate hip joint degenerative changes bilaterally. No findings for stress fracture or AVN. No hip joint effusion. No periarticular fluid collections to suggest a paralabral cyst. Left anterior superior labral tear is suspected on the sagittal images. Mild bilateral peritendinosis, left greater than right and associated mild left trochanteric bursitis. The hip and pelvic musculature are unremarkable. No muscle tear, myositis or mass.  The pubic symphysis and SI joints are intact. No pelvic fractures or bone lesions. No significant intrapelvic abnormalities are identified. There is a 2.5 cm simple appearing left adnexal cyst noted. IMPRESSION: 1. Mild to moderate bilateral hip joint degenerative changes bilaterally. No findings for stress fracture or AVN. 2. Anterior superior labral tear suspected on the left. 3. Mild bilateral peritendinosis, left greater than right and associated mild left trochanteric bursitis. Electronically Signed   By: Marijo Sanes M.D.   On: 04/15/2020 07:44  I, [name], personally (independently) visualized and performed the interpretation of the images attached in this note.   Procedure: Real-time Ultrasound Guided Injection of left hip greater trochanter bursa Device: Philips Affiniti 50G Images permanently stored and available for review in the ultrasound unit. Verbal informed consent obtained.  Discussed risks and benefits of procedure. Warned about infection bleeding damage to structures skin hypopigmentation and fat atrophy among others. Patient expresses understanding and agreement Time-out conducted.   Noted no overlying erythema, induration, or other signs of local infection.   Skin prepped in a sterile fashion.   Local anesthesia: Topical Ethyl chloride.   With sterile technique and under real time ultrasound guidance:  40 mg of Kenalog and 2 mL of Marcaine injected easily.   Completed without difficulty   Pain immediately resolved suggesting accurate placement of the medication.   Advised to call if fevers/chills, erythema, induration, drainage, or persistent bleeding.   Images permanently stored and available for review in the ultrasound unit.  Impression: Technically successful ultrasound guided injection.  Assessment and Plan: 69 y.o. female with left hip pain predominantly due to hip abductor tendinopathy and trochanteric bursitis.  She does have a superior lateral labrum  tear seen on MRI that could be contributing to her pain as well.  However she had good response to steroid injection of the trochanteric bursa in clinic today.   Orders Placed This Encounter  Procedures  . [place] LIMITED JOINT SPACE STRUCTURES LOW LEFT(NO LINKED CHARGES)    Order Specific Question:   Reason for Exam (SYMPTOM  OR DIAGNOSIS REQUIRED)    Answer:   left troch inj    Order Specific Question:   Preferred imaging location?    Answer:   [place]   No orders of the defined types were placed in this encounter.    Discussed warning signs or symptoms. Please see discharge instructions. Patient expresses understanding.   The above documentation has been reviewed and is accurate and complete [name], M.D.

## 2020-05-05 DIAGNOSIS — R238 Other skin changes: Secondary | ICD-10-CM | POA: Diagnosis not present

## 2020-05-05 DIAGNOSIS — L603 Nail dystrophy: Secondary | ICD-10-CM | POA: Diagnosis not present

## 2020-05-05 DIAGNOSIS — D225 Melanocytic nevi of trunk: Secondary | ICD-10-CM | POA: Diagnosis not present

## 2020-05-05 DIAGNOSIS — L821 Other seborrheic keratosis: Secondary | ICD-10-CM | POA: Diagnosis not present

## 2020-05-05 DIAGNOSIS — Z85820 Personal history of malignant melanoma of skin: Secondary | ICD-10-CM | POA: Diagnosis not present

## 2020-05-05 DIAGNOSIS — D485 Neoplasm of uncertain behavior of skin: Secondary | ICD-10-CM | POA: Diagnosis not present

## 2020-05-05 DIAGNOSIS — D1801 Hemangioma of skin and subcutaneous tissue: Secondary | ICD-10-CM | POA: Diagnosis not present

## 2020-05-05 DIAGNOSIS — D0362 Melanoma in situ of left upper limb, including shoulder: Secondary | ICD-10-CM | POA: Diagnosis not present

## 2020-05-05 DIAGNOSIS — L905 Scar conditions and fibrosis of skin: Secondary | ICD-10-CM | POA: Diagnosis not present

## 2020-05-09 ENCOUNTER — Other Ambulatory Visit: Payer: Self-pay | Admitting: Adult Health

## 2020-05-09 DIAGNOSIS — Z1231 Encounter for screening mammogram for malignant neoplasm of breast: Secondary | ICD-10-CM

## 2020-05-21 DIAGNOSIS — C4362 Malignant melanoma of left upper limb, including shoulder: Secondary | ICD-10-CM | POA: Diagnosis not present

## 2020-05-21 DIAGNOSIS — L905 Scar conditions and fibrosis of skin: Secondary | ICD-10-CM | POA: Diagnosis not present

## 2020-05-23 ENCOUNTER — Other Ambulatory Visit: Payer: Self-pay

## 2020-05-23 ENCOUNTER — Ambulatory Visit
Admission: RE | Admit: 2020-05-23 | Discharge: 2020-05-23 | Disposition: A | Discharge status: Home / Self Care | Payer: Medicare Other | Source: Ambulatory Visit | Attending: Adult Health | Admitting: Adult Health

## 2020-05-23 DIAGNOSIS — Z1231 Encounter for screening mammogram for malignant neoplasm of breast: Secondary | ICD-10-CM | POA: Diagnosis not present

## 2020-05-23 IMAGING — MG DIGITAL SCREENING BILAT W/ TOMO W/ CAD
6 of 10 series · 6 of 30 positions shown · non-contrast
Comparison: Previous exam(s).

CLINICAL DATA: Screening.

EXAM:
DIGITAL SCREENING BILATERAL MAMMOGRAM WITH TOMO AND CAD

[L MLO synth-2D (1 of 2)]
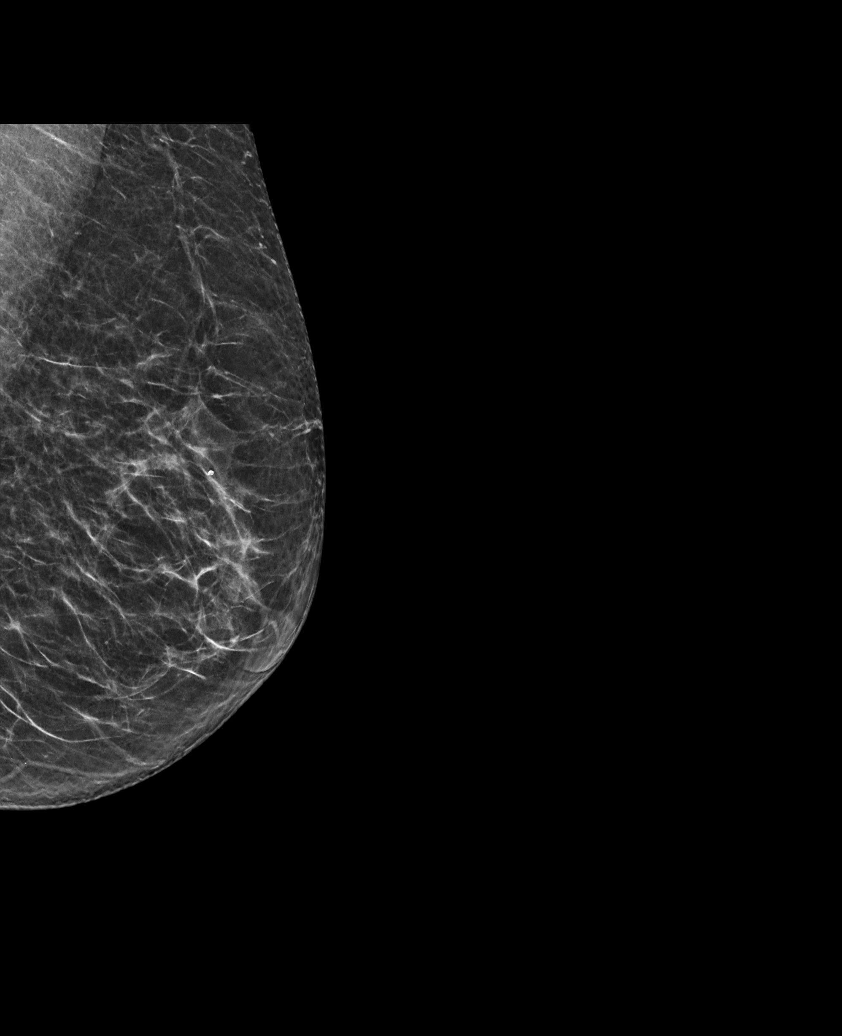

[R MLO synth-2D]
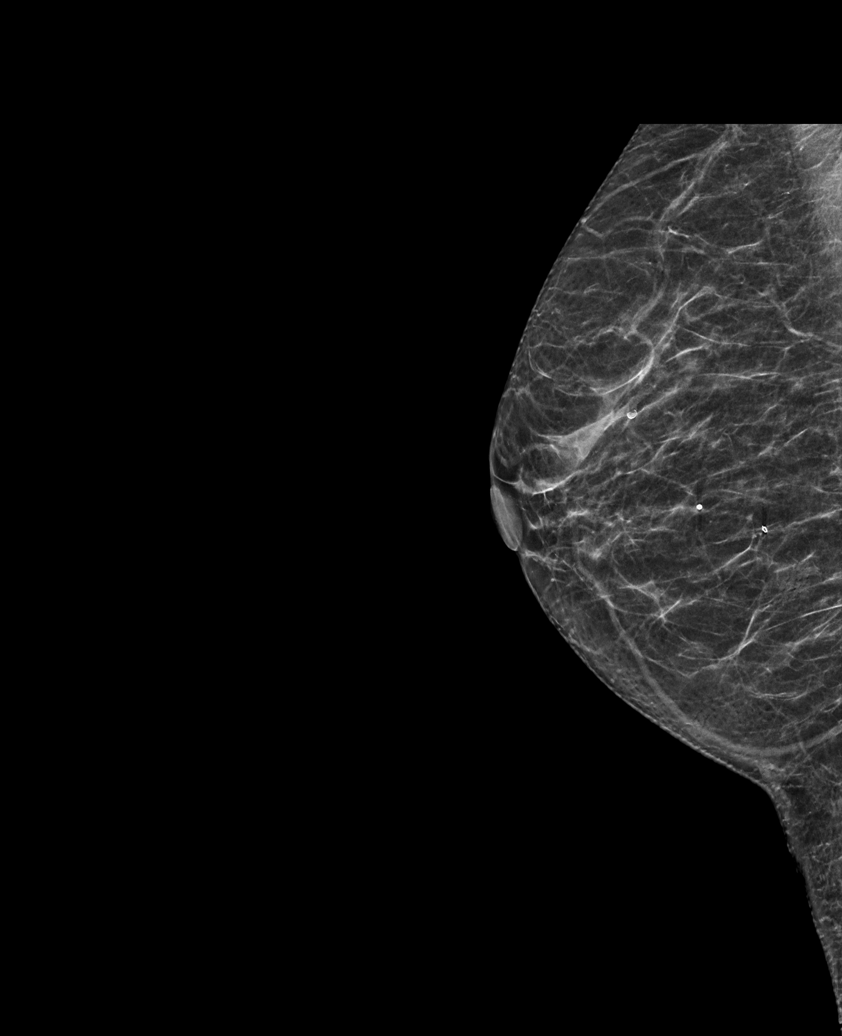

[L MLO synth-2D (2 of 2)]
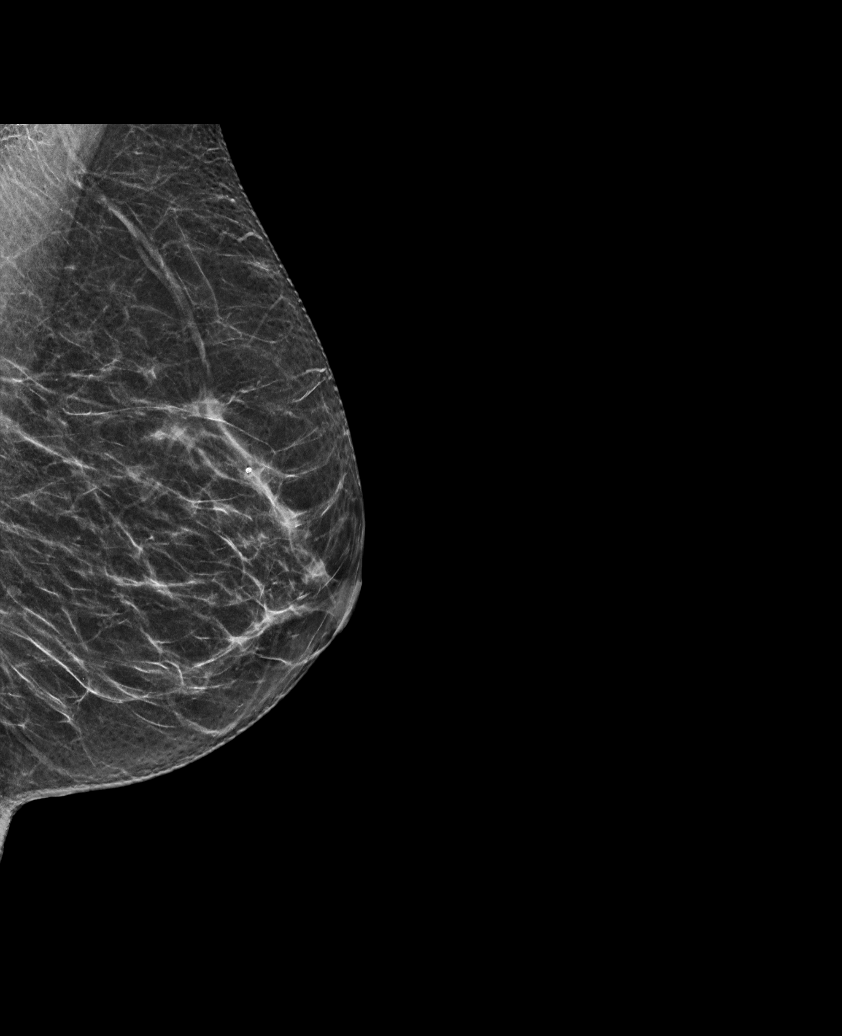

[L CC synth-2D]
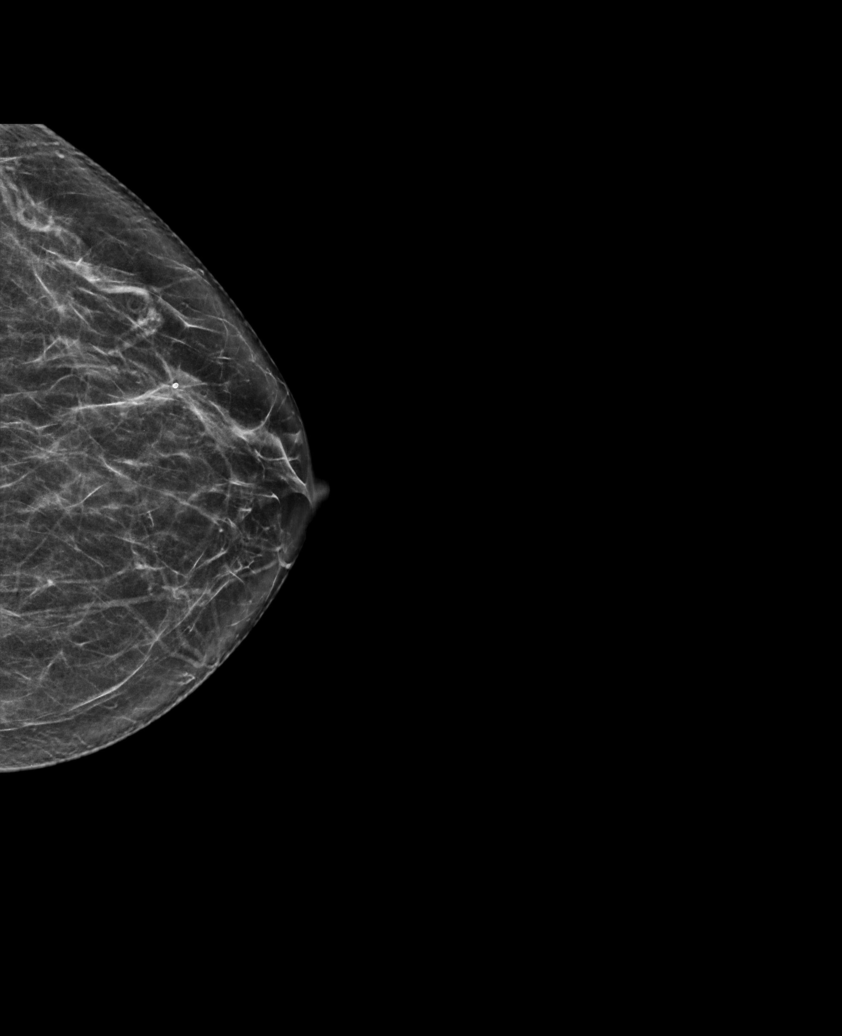

[R CC synth-2D]
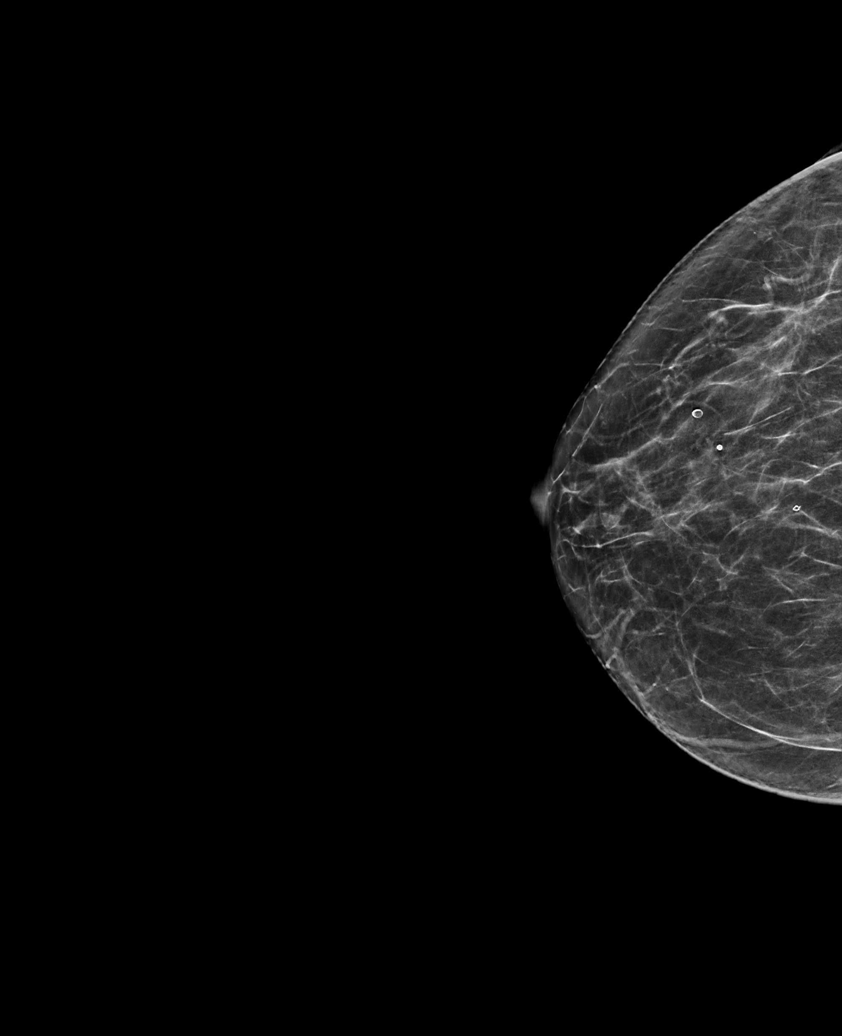

[R MLO tomo · tomo slice 29/58.0]
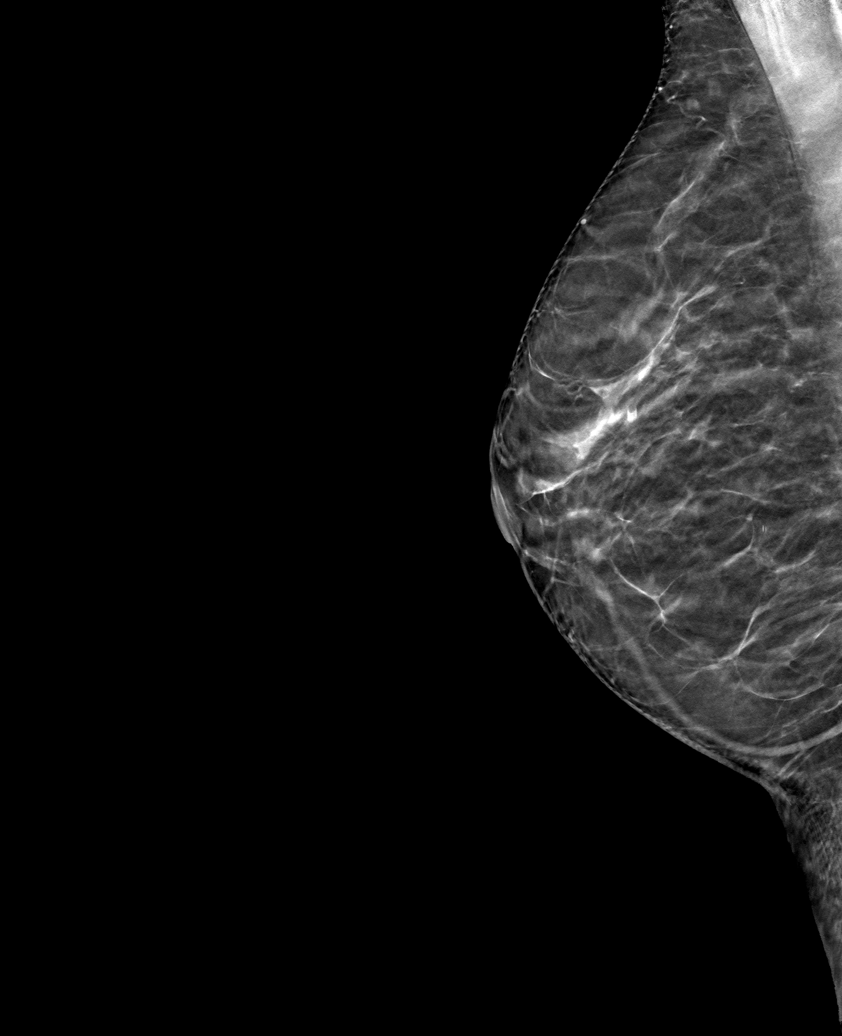

[6 of 30 positions shown; findings below may reference images not displayed]

ACR Breast Density Category b: There are scattered areas of
fibroglandular density.
FINDINGS: There are no findings suspicious for malignancy. Images were
processed with CAD.
IMPRESSION: No mammographic evidence of malignancy. A result letter of this
screening mammogram will be mailed directly to the patient.

RECOMMENDATION:
Screening mammogram in one year. (Code:[TQ])

BI-RADS CATEGORY  1: Negative.

## 2020-07-08 DIAGNOSIS — Z23 Encounter for immunization: Secondary | ICD-10-CM | POA: Diagnosis not present

## 2020-07-31 DIAGNOSIS — Z23 Encounter for immunization: Secondary | ICD-10-CM | POA: Diagnosis not present

## 2020-08-07 DIAGNOSIS — D225 Melanocytic nevi of trunk: Secondary | ICD-10-CM | POA: Diagnosis not present

## 2020-08-07 DIAGNOSIS — D2262 Melanocytic nevi of left upper limb, including shoulder: Secondary | ICD-10-CM | POA: Diagnosis not present

## 2020-08-07 DIAGNOSIS — Z85820 Personal history of malignant melanoma of skin: Secondary | ICD-10-CM | POA: Diagnosis not present

## 2020-08-07 DIAGNOSIS — D485 Neoplasm of uncertain behavior of skin: Secondary | ICD-10-CM | POA: Diagnosis not present

## 2020-08-07 DIAGNOSIS — L989 Disorder of the skin and subcutaneous tissue, unspecified: Secondary | ICD-10-CM | POA: Diagnosis not present

## 2020-08-07 DIAGNOSIS — L905 Scar conditions and fibrosis of skin: Secondary | ICD-10-CM | POA: Diagnosis not present

## 2020-08-07 DIAGNOSIS — D1801 Hemangioma of skin and subcutaneous tissue: Secondary | ICD-10-CM | POA: Diagnosis not present

## 2020-08-07 DIAGNOSIS — L84 Corns and callosities: Secondary | ICD-10-CM | POA: Diagnosis not present

## 2020-08-07 DIAGNOSIS — L821 Other seborrheic keratosis: Secondary | ICD-10-CM | POA: Diagnosis not present

## 2020-08-11 DIAGNOSIS — D2362 Other benign neoplasm of skin of left upper limb, including shoulder: Secondary | ICD-10-CM | POA: Diagnosis not present

## 2020-08-11 DIAGNOSIS — D0359 Melanoma in situ of other part of trunk: Secondary | ICD-10-CM | POA: Diagnosis not present

## 2020-08-19 DIAGNOSIS — C4362 Malignant melanoma of left upper limb, including shoulder: Secondary | ICD-10-CM | POA: Diagnosis not present

## 2020-08-28 DIAGNOSIS — D0359 Melanoma in situ of other part of trunk: Secondary | ICD-10-CM | POA: Diagnosis not present

## 2020-08-28 DIAGNOSIS — L905 Scar conditions and fibrosis of skin: Secondary | ICD-10-CM | POA: Diagnosis not present

## 2020-09-10 ENCOUNTER — Encounter: Payer: Self-pay | Admitting: Adult Health

## 2020-09-10 ENCOUNTER — Other Ambulatory Visit: Payer: Self-pay | Admitting: Adult Health

## 2020-09-10 MED ORDER — DOXYCYCLINE HYCLATE 100 MG PO CAPS
100.0000 mg | ORAL_CAPSULE | Freq: Two times a day (BID) | ORAL | 0 refills | Status: DC
Start: 2020-09-10 — End: 2020-10-21

## 2020-10-07 ENCOUNTER — Ambulatory Visit: Payer: Medicare Other

## 2020-10-17 ENCOUNTER — Other Ambulatory Visit: Payer: Self-pay

## 2020-10-21 ENCOUNTER — Other Ambulatory Visit: Payer: Self-pay | Admitting: Adult Health

## 2020-10-21 ENCOUNTER — Ambulatory Visit (INDEPENDENT_AMBULATORY_CARE_PROVIDER_SITE_OTHER): Payer: Medicare Other | Admitting: Adult Health

## 2020-10-21 ENCOUNTER — Other Ambulatory Visit: Payer: Self-pay

## 2020-10-21 ENCOUNTER — Encounter: Payer: Self-pay | Admitting: Adult Health

## 2020-10-21 VITALS — BP 110/84 | Temp 97.6°F | Wt 123.0 lb

## 2020-10-21 DIAGNOSIS — E039 Hypothyroidism, unspecified: Secondary | ICD-10-CM

## 2020-10-21 DIAGNOSIS — C439 Malignant melanoma of skin, unspecified: Secondary | ICD-10-CM | POA: Diagnosis not present

## 2020-10-21 DIAGNOSIS — E7800 Pure hypercholesterolemia, unspecified: Secondary | ICD-10-CM | POA: Diagnosis not present

## 2020-10-21 LAB — CBC WITH DIFFERENTIAL/PLATELET
Basophils Absolute: 0 10*3/uL (ref 0.0–0.1)
Basophils Relative: 0.8 % (ref 0.0–3.0)
Eosinophils Absolute: 0.1 10*3/uL (ref 0.0–0.7)
Eosinophils Relative: 3 % (ref 0.0–5.0)
HCT: 43.1 % (ref 36.0–46.0)
Hemoglobin: 14.3 g/dL (ref 12.0–15.0)
Lymphocytes Relative: 31.1 % (ref 12.0–46.0)
Lymphs Abs: 1.5 10*3/uL (ref 0.7–4.0)
MCHC: 33.2 g/dL (ref 30.0–36.0)
MCV: 90.7 fl (ref 78.0–100.0)
Monocytes Absolute: 0.4 10*3/uL (ref 0.1–1.0)
Monocytes Relative: 8.5 % (ref 3.0–12.0)
Neutro Abs: 2.7 10*3/uL (ref 1.4–7.7)
Neutrophils Relative %: 56.6 % (ref 43.0–77.0)
Platelets: 444 10*3/uL — ABNORMAL HIGH (ref 150.0–400.0)
RBC: 4.75 Mil/uL (ref 3.87–5.11)
RDW: 14.1 % (ref 11.5–15.5)
WBC: 4.8 10*3/uL (ref 4.0–10.5)

## 2020-10-21 LAB — COMPREHENSIVE METABOLIC PANEL
ALT: 16 U/L (ref 0–35)
AST: 22 U/L (ref 0–37)
Albumin: 4.6 g/dL (ref 3.5–5.2)
Alkaline Phosphatase: 53 U/L (ref 39–117)
BUN: 14 mg/dL (ref 6–23)
CO2: 30 mEq/L (ref 19–32)
Calcium: 9.9 mg/dL (ref 8.4–10.5)
Chloride: 102 mEq/L (ref 96–112)
Creatinine, Ser: 0.95 mg/dL (ref 0.40–1.20)
GFR: 61.72 mL/min (ref >60.00)
Glucose, Bld: 93 mg/dL (ref 70–99)
Potassium: 4.2 mEq/L (ref 3.5–5.1)
Sodium: 139 mEq/L (ref 135–145)
Total Bilirubin: 0.6 mg/dL (ref 0.2–1.2)
Total Protein: 7.2 g/dL (ref 6.0–8.3)

## 2020-10-21 LAB — TSH: TSH: 2.15 u[IU]/mL (ref 0.35–4.50)

## 2020-10-21 LAB — LIPID PANEL
Cholesterol: 221 mg/dL — ABNORMAL HIGH (ref 0–200)
HDL: 109.1 mg/dL (ref >39.00)
LDL Cholesterol: 96 mg/dL (ref 0–99)
NonHDL: 112.11
Total CHOL/HDL Ratio: 2
Triglycerides: 82 mg/dL (ref 0.0–149.0)
VLDL: 16.4 mg/dL (ref 0.0–40.0)

## 2020-10-21 MED ORDER — LEVOTHYROXINE SODIUM 50 MCG PO TABS: 50.0000 ug | ORAL_TABLET | Freq: Every day | ORAL | 3 refills | Status: DC

## 2020-10-21 NOTE — Progress Notes (Signed)
Subjective:    Patient ID: Natasha Norman, female    DOB: 1950-12-22, 70 y.o.   MRN: [id number]  HPI Patient presents for yearly preventative medicine examination. She is a pleasant 70 year old female who  has a past medical history of Cancer ([place]) (1996), Chicken pox, H/O repair of rotator cuff, Hypothyroidism, Osteopenia, and Seasonal allergies.  Hypothyroidism - takes synthroid 50 mcg daily. Denies symptoms of hypo or hyperthyroidism  Lab Results  Component Value Date   TSH 1.70 09/07/2019   Hyperlipidemia - not currently on medication. Has refused in the past.   Lab Results  Component Value Date   CHOL 220 (H) 09/07/2019   HDL 104.20 09/07/2019   LDLCALC 101 (H) 09/07/2019   TRIG 76.0 09/07/2019   CHOLHDL 2 09/07/2019    H/o of Melanoma - is seen by the [facility] every 3 months.  The last 6 months she has had 2 separate melanomas removed, most recently in December 2021 with an excision below the right collarbone.  She does have a follow-up in February  All immunizations and health maintenance protocols were reviewed with the patient and needed orders were placed. She is up to date on routine vaccinations   Appropriate screening laboratory values were ordered for the patient including screening of hyperlipidemia, renal function and hepatic function.   Medication reconciliation,  past medical history, social history, problem list and allergies were reviewed in detail with the patient  Goals were established with regard to weight loss, exercise, and  diet in compliance with medications  She is up to date on routine colon cancer screening.    Review of Systems  Constitutional: Negative.   HENT: Negative.   Eyes: Negative.   Respiratory: Negative.   Cardiovascular: Negative.   Gastrointestinal: Negative.   Endocrine: Negative.   Genitourinary: Negative.   Musculoskeletal: Negative.   Skin: Negative.   Allergic/Immunologic: Negative.   Neurological:  Negative.   Hematological: Negative.   Psychiatric/Behavioral: Negative.    Past Medical History:  Diagnosis Date  . Cancer ([place]) 1996   Melanoma  . Chicken pox   . H/O repair of rotator cuff    Right   . Hypothyroidism   . Osteopenia   . Seasonal allergies     Social History   Socioeconomic History  . Marital status: Married    Spouse name: Not on file  . Number of children: 3  . Years of education: 4 years college  . Highest education level: Bachelor's degree (e.g., BA, AB, BS)  Occupational History  . Occupation: Retired  Tobacco Use  . Smoking status: Never Smoker  . Smokeless tobacco: Never Used  Substance and Sexual Activity  . Alcohol use: Yes    Alcohol/week: 1.0 standard drink    Types: 1 Glasses of wine per week    Comment: Wine occasionally  . Drug use: No  . Sexual activity: Not on file  Other Topics Concern  . 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 BREAST  BIOPSY Right 1984, 1996   Benign  . lymph node resection  1998    Family History  Problem Relation Age of Onset  . Cancer Mother        breast cancer  . Breast cancer Mother   . Heart disease Father   . Diabetes Father   . Heart failure Maternal Grandmother   . Stroke Paternal Grandfather     No Known Allergies  Current Outpatient Medications on File Prior to Visit  Medication Sig Dispense Refill  . calcium carbonate (CALCIUM 600) 600 MG TABS tablet Take 600 mg by mouth 2 (two) times daily with a meal.    . doxycycline (VIBRAMYCIN) 100 MG capsule Take 1 capsule  (100 mg total) by mouth 2 (two) times daily. 14 capsule 0  . GLUCOSAMINE-CHONDROITIN PO Take by mouth.    . levothyroxine (SYNTHROID) 50 MCG tablet Take 1 tablet (50 mcg total) by mouth daily before breakfast. 90 tablet 3  . loratadine (CLARITIN) 10 MG tablet Take 10 mg by mouth daily.    . Multiple Vitamin (MULTIVITAMIN) tablet Take 1 tablet by mouth daily.    Marland Kitchen VITAMIN D PO Take by mouth.     No current facility-administered medications on file prior to visit.    BP 110/84   Temp 97.6 F (36.4 C)   Wt 123 lb (55.8 kg)   BMI 20.47 kg/m       Objective:   Physical Exam Vitals and nursing note reviewed.  Constitutional:      General: She is not in acute distress.    Appearance: Normal appearance. She is well-developed. She is not ill-appearing.  HENT:     Head: Normocephalic and atraumatic.     Right Ear: Tympanic membrane, ear canal and external ear normal. There is no impacted cerumen.     Left Ear: Tympanic membrane, ear canal and external ear normal. There is no impacted cerumen.     Nose: Nose normal. No congestion or rhinorrhea.     Mouth/Throat:     Mouth: Mucous membranes are moist.     Pharynx: Oropharynx is clear. No oropharyngeal exudate or posterior oropharyngeal erythema.  Eyes:     General:        Right eye: No discharge.        Left eye: No discharge.     Extraocular Movements: Extraocular movements intact.     Conjunctiva/sclera: Conjunctivae normal.     Pupils: Pupils are equal, round, and reactive to light.  Neck:     Thyroid: No thyromegaly.     Vascular: No carotid bruit.     Trachea: No tracheal deviation.  Cardiovascular:     Rate and Rhythm: Normal rate and regular rhythm.     Pulses: Normal pulses.     Heart sounds: Normal heart sounds. No murmur heard. No friction rub. No gallop.   Pulmonary:     Effort: Pulmonary effort is normal. No respiratory distress.     Breath sounds: Normal breath sounds. No stridor. No wheezing, rhonchi or rales.   Chest:     Chest wall: No tenderness.  Abdominal:     General: Abdomen is flat. Bowel sounds are normal. There is no distension.     Palpations: Abdomen is soft. There is no mass.     Tenderness: There is no abdominal tenderness. There is no right CVA tenderness, left CVA tenderness, guarding or rebound.     Hernia: No hernia is present.  Musculoskeletal:  General: No swelling, tenderness, deformity or signs of injury. Normal range of motion.     Cervical back: Normal range of motion and neck supple.     Right lower leg: No edema.     Left lower leg: No edema.  Lymphadenopathy:     Cervical: No cervical adenopathy.  Skin:    General: Skin is warm and dry.     Coloration: Skin is not jaundiced or pale.     Findings: No bruising, erythema, lesion or rash.  Neurological:     General: No focal deficit present.     Mental Status: She is alert and oriented to person, place, and time.     Cranial Nerves: No cranial nerve deficit.     Sensory: No sensory deficit.     Motor: No weakness.     Coordination: Coordination normal.     Gait: Gait normal.     Deep Tendon Reflexes: Reflexes normal.  Psychiatric:        Mood and Affect: Mood normal.        Behavior: Behavior normal.        Thought Content: Thought content normal.        Judgment: Judgment normal.       Assessment & Plan:  1. Hypothyroidism, unspecified type - Consider increase in statin  - CBC with Differential/Platelet; Future - Comprehensive metabolic panel; Future - Lipid panel; Future - TSH; Future  2. Pure hypercholesterolemia - Consider statin  - CBC with Differential/Platelet; Future - Comprehensive metabolic panel; Future - Lipid panel; Future - TSH; Future  3. Melanoma of skin ([place]) - Follow up with dermatology as directed  Dorothyann Peng, NP

## 2020-11-05 DIAGNOSIS — L814 Other melanin hyperpigmentation: Secondary | ICD-10-CM | POA: Diagnosis not present

## 2020-11-05 DIAGNOSIS — D225 Melanocytic nevi of trunk: Secondary | ICD-10-CM | POA: Diagnosis not present

## 2020-11-05 DIAGNOSIS — Z85820 Personal history of malignant melanoma of skin: Secondary | ICD-10-CM | POA: Diagnosis not present

## 2020-11-05 DIAGNOSIS — L905 Scar conditions and fibrosis of skin: Secondary | ICD-10-CM | POA: Diagnosis not present

## 2020-11-05 DIAGNOSIS — Z872 Personal history of diseases of the skin and subcutaneous tissue: Secondary | ICD-10-CM | POA: Diagnosis not present

## 2020-11-05 DIAGNOSIS — L821 Other seborrheic keratosis: Secondary | ICD-10-CM | POA: Diagnosis not present

## 2020-11-11 ENCOUNTER — Ambulatory Visit (INDEPENDENT_AMBULATORY_CARE_PROVIDER_SITE_OTHER): Payer: Medicare Other

## 2020-11-11 ENCOUNTER — Other Ambulatory Visit: Payer: Self-pay

## 2020-11-11 VITALS — BP 120/82 | HR 66 | Temp 98.1°F | Resp 20 | Wt 127.8 lb

## 2020-11-11 DIAGNOSIS — Z0000 Encounter for general adult medical examination without abnormal findings: Secondary | ICD-10-CM

## 2020-11-11 NOTE — Patient Instructions (Addendum)
Natasha Norman , Thank you for taking time to come for your Medicare Wellness Visit. I appreciate your ongoing commitment to your health goals. Please review the following plan we discussed and let me know if I can assist you in the future.   Screening recommendations/referrals: Colonoscopy: Done 09/27/12 Mammogram: Done 05/23/20 Bone Density: Done 12/11/19 Recommended yearly ophthalmology/optometry visit for glaucoma screening and checkup Recommended yearly dental visit for hygiene and checkup  Vaccinations: Influenza vaccine: Done 07/08/20 Pneumococcal vaccine: Up to date Tdap vaccine: Up to date Shingles vaccine: Completed 3/31 & 06/13/20  Covid-19:Completed 2/7, 3/3, & 07/31/20  Advanced directives: Copies in chart  Conditions/risks identified: None at this time   Next appointment: Follow up in one year for your annual wellness visit    Preventive Care 50 Years and Older, Female Preventive care refers to lifestyle choices and visits with your health care provider that can promote health and wellness. What does preventive care include?  A yearly physical exam. This is also called an annual well check.  Dental exams once or twice a year.  Routine eye exams. Ask your health care provider how often you should have your eyes checked.  Personal lifestyle choices, including:  Daily care of your teeth and gums.  Regular physical activity.  Eating a healthy diet.  Avoiding tobacco and drug use.  Limiting alcohol use.  Practicing safe sex.  Taking low-dose aspirin every day.  Taking vitamin and mineral supplements as recommended by your health care provider. What happens during an annual well check? The services and screenings done by your health care provider during your annual well check will depend on your age, overall health, lifestyle risk factors, and family history of disease. Counseling  Your health care provider may ask you questions about your:  Alcohol  use.  Tobacco use.  Drug use.  Emotional well-being.  Home and relationship well-being.  Sexual activity.  Eating habits.  History of falls.  Memory and ability to understand (cognition).  Work and work Statistician.  Reproductive health. Screening  You may have the following tests or measurements:  Height, weight, and BMI.  Blood pressure.  Lipid and cholesterol levels. These may be checked every 5 years, or more frequently if you are over 35 years old.  Skin check.  Lung cancer screening. You may have this screening every year starting at age 69 if you have a 30-pack-year history of smoking and currently smoke or have quit within the past 15 years.  Fecal occult blood test (FOBT) of the stool. You may have this test every year starting at age 65.  Flexible sigmoidoscopy or colonoscopy. You may have a sigmoidoscopy every 5 years or a colonoscopy every 10 years starting at age 23.  Hepatitis C blood test.  Hepatitis B blood test.  Sexually transmitted disease (STD) testing.  Diabetes screening. This is done by checking your blood sugar (glucose) after you have not eaten for a while (fasting). You may have this done every 1-3 years.  Bone density scan. This is done to screen for osteoporosis. You may have this done starting at age 25.  Mammogram. This may be done every 1-2 years. Talk to your health care provider about how often you should have regular mammograms. Talk with your health care provider about your test results, treatment options, and if necessary, the need for more tests. Vaccines  Your health care provider may recommend certain vaccines, such as:  Influenza vaccine. This is recommended every year.  Tetanus, diphtheria, and  acellular pertussis (Tdap, Td) vaccine. You may need a Td booster every 10 years.  Zoster vaccine. You may need this after age 6.  Pneumococcal 13-valent conjugate (PCV13) vaccine. One dose is recommended after age  26.  Pneumococcal polysaccharide (PPSV23) vaccine. One dose is recommended after age 52. Talk to your health care provider about which screenings and vaccines you need and how often you need them. This information is not intended to replace advice given to you by your health care provider. Make sure you discuss any questions you have with your health care provider. Document Released: 10/10/2015 Document Revised: 06/02/2016 Document Reviewed: 07/15/2015 Elsevier Interactive Patient Education  2017 [place] Prevention in the Home Falls can cause injuries. They can happen to people of all ages. There are many things you can do to make your home safe and to help prevent falls. What can I do on the outside of my home?  Regularly fix the edges of walkways and driveways and fix any cracks.  Remove anything that might make you trip as you walk through a door, such as a raised step or threshold.  Trim any bushes or trees on the path to your home.  Use bright outdoor lighting.  Clear any walking paths of anything that might make someone trip, such as rocks or tools.  Regularly check to see if handrails are loose or broken. Make sure that both sides of any steps have handrails.  Any raised decks and porches should have guardrails on the edges.  Have any leaves, snow, or ice cleared regularly.  Use sand or salt on walking paths during winter.  Clean up any spills in your garage right away. This includes oil or grease spills. What can I do in the bathroom?  Use night lights.  Install grab bars by the toilet and in the tub and shower. Do not use towel bars as grab bars.  Use non-skid mats or decals in the tub or shower.  If you need to sit down in the shower, use a plastic, non-slip stool.  Keep the floor dry. Clean up any water that spills on the floor as soon as it happens.  Remove soap buildup in the tub or shower regularly.  Attach bath mats securely with double-sided  non-slip rug tape.  Do not have throw rugs and other things on the floor that can make you trip. What can I do in the bedroom?  Use night lights.  Make sure that you have a light by your bed that is easy to reach.  Do not use any sheets or blankets that are too big for your bed. They should not hang down onto the floor.  Have a firm chair that has side arms. You can use this for support while you get dressed.  Do not have throw rugs and other things on the floor that can make you trip. What can I do in the kitchen?  Clean up any spills right away.  Avoid walking on wet floors.  Keep items that you use a lot in easy-to-reach places.  If you need to reach something above you, use a strong step stool that has a grab bar.  Keep electrical cords out of the way.  Do not use floor polish or wax that makes floors slippery. If you must use wax, use non-skid floor wax.  Do not have throw rugs and other things on the floor that can make you trip. What can I do with my stairs?  Do not leave any items on the stairs.  Make sure that there are handrails on both sides of the stairs and use them. Fix handrails that are broken or loose. Make sure that handrails are as long as the stairways.  Check any carpeting to make sure that it is firmly attached to the stairs. Fix any carpet that is loose or worn.  Avoid having throw rugs at the top or bottom of the stairs. If you do have throw rugs, attach them to the floor with carpet tape.  Make sure that you have a light switch at the top of the stairs and the bottom of the stairs. If you do not have them, ask someone to add them for you. What else can I do to help prevent falls?  Wear shoes that:  Do not have high heels.  Have rubber bottoms.  Are comfortable and fit you well.  Are closed at the toe. Do not wear sandals.  If you use a stepladder:  Make sure that it is fully opened. Do not climb a closed stepladder.  Make sure that both  sides of the stepladder are locked into place.  Ask someone to hold it for you, if possible.  Clearly mark and make sure that you can see:  Any grab bars or handrails.  First and last steps.  Where the edge of each step is.  Use tools that help you move around (mobility aids) if they are needed. These include:  Canes.  Walkers.  Scooters.  Crutches.  Turn on the lights when you go into a dark area. Replace any light bulbs as soon as they burn out.  Set up your furniture so you have a clear path. Avoid moving your furniture around.  If any of your floors are uneven, fix them.  If there are any pets around you, be aware of where they are.  Review your medicines with your doctor. Some medicines can make you feel dizzy. This can increase your chance of falling. Ask your doctor what other things that you can do to help prevent falls. This information is not intended to replace advice given to you by your health care provider. Make sure you discuss any questions you have with your health care provider. Document Released: 07/10/2009 Document Revised: 02/19/2016 Document Reviewed: 10/18/2014 Elsevier Interactive Patient Education  2017 [employer].

## 2020-11-11 NOTE — Progress Notes (Addendum)
Subjective:   Natasha Norman is a 70 y.o. female who presents for Medicare Annual (Subsequent) preventive examination.  Review of Systems     Cardiac Risk Factors include: advanced age (>38men, >70 women)     Objective:    Today's Vitals   11/11/20 0933  BP: 120/82  Pulse: 66  Resp: 20  Temp: 98.1 F (36.7 C)  SpO2: 100%  Weight: 127 lb 12.8 oz (58 kg)   Body mass index is 21.27 kg/m.  Advanced Directives 11/11/2020 01/24/2020 09/24/2019 02/02/2017  Does Patient Have a Medical Advance Directive? Yes Yes Yes Yes  Type of Paramedic of [place];Living will Living will [place];Living will  Does patient want to make changes to medical advance directive? - No - Patient declined No - Patient declined No - Patient declined  Copy of [place] in Chart? Yes - validated most recent copy scanned in chart (See row information) Yes - validated most recent copy scanned in chart (See row information) - Yes    Current Medications (verified) Outpatient Encounter Medications as of 11/11/2020  Medication Sig  . calcium carbonate (OS-CAL) 600 MG TABS tablet Take 600 mg by mouth 2 (two) times daily with a meal.  . GLUCOSAMINE-CHONDROITIN PO Take by mouth.  . levothyroxine (SYNTHROID) 50 MCG tablet Take 1 tablet (50 mcg total) by mouth daily before breakfast.  . loratadine (CLARITIN) 10 MG tablet Take 10 mg by mouth daily.  . Multiple Vitamin (MULTIVITAMIN) tablet Take 1 tablet by mouth daily.  Marland Kitchen VITAMIN D PO Take by mouth.   No facility-administered encounter medications on file as of 11/11/2020.    Allergies (verified) Patient has no known allergies.   History: Past Medical History:  Diagnosis Date  . Cancer ([place]) 1996   Melanoma  . Chicken pox   . H/O repair of rotator cuff    Right   . Hypothyroidism   . Osteopenia   . Seasonal allergies    Past Surgical History:  Procedure  Laterality Date  . BREAST BIOPSY Right 1984, 1996   Benign  . lymph node resection  1998   Family History  Problem Relation Age of Onset  . Cancer Mother        breast cancer  . Breast cancer Mother   . Heart disease Father   . Diabetes Father   . Heart failure Maternal Grandmother   . Stroke Paternal Grandfather    Social History   Socioeconomic History  . Marital status: Married    Spouse name: Not on file  . Number of children: 3  . Years of education: 4 years college  . Highest education level: Bachelor's degree (e.g., BA, AB, BS)  Occupational History  . Occupation: Retired  Tobacco Use  . Smoking status: Never Smoker  . Smokeless tobacco: Never Used  Substance and Sexual Activity  . Alcohol use: Yes    Alcohol/week: 1.0 standard drink    Types: 1 Glasses of wine per week    Comment: Wine occasionally  . Drug use: No  . Sexual activity: Not on file  Other Topics Concern  . 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 Difficulty of Paying Living Expenses: Not hard at all  Food Insecurity: No Food Insecurity  . Worried About Charity fundraiser in the Last Year: Never true  . Ran Out of Food in the Last Year: Never true  Transportation Needs: No Transportation Needs  . Lack of Transportation (Medical): No  . Lack of Transportation (Non-Medical): No  Physical Activity: Sufficiently Active  . Days of Exercise per Week: 7 days  . Minutes of Exercise per Session: 50 min  Stress: No Stress Concern Present  . Feeling of Stress : Not at all  Social Connections: Moderately Integrated  . Frequency of Communication with Friends and Family: More than three times a week  . Frequency of Social Gatherings with Friends and Family: Once a week  . Attends Religious Services: More than 4 times per year  .  Active Member of Clubs or Organizations: No  . Attends Archivist Meetings: Never  . Marital Status: Married    Tobacco Counseling Counseling given: Not Answered   Clinical Intake:  Pre-visit preparation completed: Yes  Pain : No/denies pain     BMI - recorded: 21.27 Nutritional Status: BMI of 19-24  Normal Nutritional Risks: None Diabetes: No  How often do you need to have someone help you when you read instructions, pamphlets, or other written materials from your doctor or pharmacy?: 1 - Never  Diabetic?NO  Interpreter Needed?: No  Information entered by :: Charlott Rakes, LPN   Activities of Daily Living In your present state of health, do you have any difficulty performing the following activities: 11/11/2020  Hearing? N  Vision? N  Difficulty concentrating or making decisions? N  Walking or climbing stairs? N  Dressing or bathing? N  Doing errands, shopping? N  Preparing Food and eating ? N  Using the Toilet? N  In the past six months, have you accidently leaked urine? N  Do you have problems with loss of bowel control? N  Managing your Medications? N  Managing your Finances? N  Housekeeping or managing your Housekeeping? N  Some recent data might be hidden    Patient Care Team: Dorothyann Peng, NP as PCP - General (Family Medicine)  Indicate any recent Medical Services you may have received from other than [facility] providers in the past year (date may be approximate).     Assessment:   This is a routine wellness examination for [name].  Hearing/Vision screen  Hearing Screening   125Hz  250Hz  500Hz  1000Hz  2000Hz  3000Hz  4000Hz  6000Hz  8000Hz   Right ear:           Left ear:           Comments: Pt denies any hearing issues   Vision Screening Comments: Pt follows up with dr Prudencio [facility] at [facility] opthalmology   Dietary issues and exercise activities discussed: Current Exercise Habits: Home exercise routine, Type of exercise: stretching;walking, Time  (Minutes): 30, Frequency (Times/Week): 7, Weekly Exercise (Minutes/Week): 210  Goals    . Patient Stated     Decrease cholesterol    . Patient Stated     None at this time       Depression Screen PHQ 2/9 Scores 11/11/2020 09/24/2019 07/11/2018 02/17/2018  PHQ - 2 Score 0 0 0 0    Fall Risk Fall Risk  11/11/2020 09/24/2019 08/20/2019 07/11/2018 02/17/2018  Falls in the past year? 0 0 1 No No  Comment - - Emmi Telephone Survey: data to providers prior to load - -  Number falls in past yr: 0 - 1 - -  Comment - -  Emmi Telephone Survey Actual Response = 1 - -  Injury with Fall? 0 - 0 - -  Risk for fall due to : Impaired vision - - - -  Follow up Falls prevention discussed - - - -    FALL RISK PREVENTION PERTAINING TO THE HOME:  Any stairs in or around the home? Yes  If so, are there any without handrails? No  Home free of loose throw rugs in walkways, pet beds, electrical cords, etc? Yes  Adequate lighting in your home to reduce risk of falls? Yes   ASSISTIVE DEVICES UTILIZED TO PREVENT FALLS:  Life alert? No  Use of a cane, walker or w/c? No  Grab bars in the bathroom? No  Shower chair or bench in shower? No  Elevated toilet seat or a handicapped toilet? Yes   TIMED UP AND GO:  Was the test performed? Yes .  Length of time to ambulate 10 feet: 10 sec.   Gait steady and fast without use of assistive device  Cognitive Function:     6CIT Screen 11/11/2020 09/24/2019  What Year? 0 points 0 points  What month? 0 points 0 points  What time? - 0 points  Count back from 20 0 points 0 points  Months in reverse 0 points 0 points  Repeat phrase 0 points 0 points  Total Score - 0    Immunizations Immunization History  Administered Date(s) Administered  . Influenza, High Dose Seasonal PF 06/12/2018  . Influenza-Unspecified 07/08/2020  . PFIZER(Purple Top)SARS-COV-2 Vaccination 11/04/2019, 11/28/2019, 07/31/2020  . Pneumococcal Conjugate-13 02/17/2018  . Pneumococcal  Polysaccharide-23 09/07/2019  . Tdap 09/27/2009, 07/31/2020  . Zoster 09/27/2012  . Zoster Recombinat (Shingrix) 12/26/2019, 06/13/2020    TDAP status: Up to date  Flu Vaccine status: Up to date  Done 07/08/20 Pneumococcal vaccine status: Up to date  Covid-19 vaccine status: Completed vaccines  Qualifies for Shingles Vaccine? Yes   Zostavax completed Yes   Shingrix : 3/31 & 06/13/20 Completed   Screening Tests Health Maintenance  Topic Date Due  . COVID-19 Vaccine (4 - Booster for Pfizer series) 01/28/2021  . MAMMOGRAM  05/23/2022  . COLONOSCOPY (Pts 45-76yrs Insurance coverage will need to be confirmed)  09/27/2022  . TETANUS/TDAP  07/31/2030  . INFLUENZA VACCINE  Completed  . DEXA SCAN  Completed  . Hepatitis C Screening  Completed  . PNA vac Low Risk Adult  Completed    Health Maintenance  There are no preventive care reminders to display for this patient.  Colorectal cancer screening: Type of screening: Colonoscopy. Completed 09/27/2012. Repeat every 10 years  Mammogram status: Completed 05/23/20. Repeat every year  Bone Density status: Completed 12/11/19. Results reflect: Bone density results: OSTEOPENIA. Repeat every 2 years.   Additional Screening:  Hepatitis C Screening:  Completed 02/02/17  Vision Screening: Recommended annual ophthalmology exams for early detection of glaucoma and other disorders of the eye. Is the patient up to date with their annual eye exam?  Yes  Who is the provider or what is the name of the office in which the patient attends annual eye exams? Dr Prudencio Burly  If pt is not established with a provider, would they like to be referred to a provider to establish care? No .   Dental Screening: Recommended annual dental exams for proper oral hygiene  Community Resource Referral / Chronic Care Management: CRR required this visit?  No   CCM required this visit?  No      Plan:  I have personally reviewed and noted the following in the  patient's chart:   . Medical and social history . Use of alcohol, tobacco or illicit drugs  . Current medications and supplements . Functional ability and status . Nutritional status . Physical activity . Advanced directives . List of other physicians . Hospitalizations, surgeries, and ER visits in previous 12 months . Vitals . Screenings to include cognitive, depression, and falls . Referrals and appointments  In addition, I have reviewed and discussed with patient certain preventive protocols, quality metrics, and best practice recommendations. A written personalized care plan for preventive services as well as general preventive health recommendations were provided to patient.     Willette Brace, LPN   [id number]/6840   Nurse Notes: please advise pt is concerned if she needs to follow up with a GYN provider or will you be the one she sees for care and pelvic exams?

## 2020-12-15 ENCOUNTER — Other Ambulatory Visit: Payer: Self-pay

## 2020-12-15 ENCOUNTER — Ambulatory Visit (INDEPENDENT_AMBULATORY_CARE_PROVIDER_SITE_OTHER): Payer: Medicare Other | Admitting: Family Medicine

## 2020-12-15 ENCOUNTER — Encounter: Payer: Self-pay | Admitting: Family Medicine

## 2020-12-15 ENCOUNTER — Ambulatory Visit: Payer: Self-pay

## 2020-12-15 VITALS — BP 130/72 | HR 99 | Ht 65.0 in | Wt 124.2 lb

## 2020-12-15 DIAGNOSIS — M25552 Pain in left hip: Secondary | ICD-10-CM

## 2020-12-15 DIAGNOSIS — M7062 Trochanteric bursitis, left hip: Secondary | ICD-10-CM

## 2020-12-15 NOTE — Progress Notes (Signed)
I, Wendy Poet, LAT, ATC, am serving as scribe for Dr. Lynne Norman.  Natasha Norman is a 70 y.o. female who presents to [place] at [facility] today for cont L hip pain due to ABD tendinopathy, GT bursitis, and superior lateral labrum tear. Pt was last seen by Dr. Georgina Snell on 04/15/20 to review MRI and was given a GT steroid injection. Since her last visit w/ Dr. Georgina Snell, pt states that the injection helped for a little while but did not fully take her pain away.  She states that the pain is always there and notes that some days are better than others.  She has been doing her HEP intermittently as prescribed by PT in summer 2021.  Pain is mostly lateral but she does have some anterior and medial pain as well.  She leaves this Friday, March 25 for a 2-week river cruise in [place].  This will involve a lot of walking.  Dx imaging: 04/13/21 L hip MRI  12/14/19 L hip/pelvis XR  Pertinent review of systems: No fevers or chills  Relevant historical information: Hypothyroidism.   Exam:  BP 130/72 (BP Location: Right Arm, Patient Position: Sitting, Cuff Size: Normal)   Pulse 99   Ht 5\' 5"  (1.651 m)   Wt 124 lb 3.2 oz (56.3 kg)   SpO2 98%   BMI 20.67 kg/m  General: Well Developed, well nourished, and in no acute distress.   MSK: Left hip normal-appearing decreased motion to internal rotation and flexion. Tender palpation greater trochanter.  Hip abduction strength is diminished 4/5.    Lab and Radiology Results   Hip greater trochanteric injection: left Consent obtained and timeout performed. Area of maximum tenderness palpated and identified. Skin cleaned with alcohol, cold spray applied. A 22g needle was used to access the greater trochanteric bursa. 40 mg of Kenalog and 2 mL of Marcaine were used to inject the trochanteric bursa. Patient tolerated the procedure well.  EXAM: MR OF THE LEFT HIP WITHOUT CONTRAST  TECHNIQUE: Multiplanar, multisequence MR  imaging was performed. No intravenous contrast was administered.  COMPARISON:  None.  FINDINGS: Both hips are normally located. Mild to moderate hip joint degenerative changes bilaterally. No findings for stress fracture or AVN. No hip joint effusion. No periarticular fluid collections to suggest a paralabral cyst.  Left anterior superior labral tear is suspected on the sagittal images.  Mild bilateral peritendinosis, left greater than right and associated mild left trochanteric bursitis. The hip and pelvic musculature are unremarkable. No muscle tear, myositis or mass.  The pubic symphysis and SI joints are intact. No pelvic fractures or bone lesions.  No significant intrapelvic abnormalities are identified. There is a 2.5 cm simple appearing left adnexal cyst noted.  IMPRESSION: 1. Mild to moderate bilateral hip joint degenerative changes bilaterally. No findings for stress fracture or AVN. 2. Anterior superior labral tear suspected on the left. 3. Mild bilateral peritendinosis, left greater than right and associated mild left trochanteric bursitis.   Electronically Signed   By: Marijo Sanes M.D.   On: 04/15/2020 07:44  I, Natasha Norman, personally (independently) visualized and performed the interpretation of the images attached in this note.    Assessment and Plan: 70 y.o. female with left lateral hip pain primarily due to hip abductor tendinopathy.  She may have atypical presentation of pain from the labrum tear and arthritis seen on MRI as well.  She is leaving for a river cruise in a few days so we will repeat  lateral hip injection and she did get some benefit from it.  Plan to check back in a month upon her return.  Additionally showed her additional home exercises that can increase hip abduction strength including step up exercises.  On return consider trial of diagnostic and possibly therapeutic intra-articular hip injection.  This may help clarify how  much of her pain is intra-articular versus lateral.  She had pretty good response to hip injection in clinic today indicates that a lot of her pain is due to the tendinopathy.   PDMP not reviewed this encounter. Orders Placed This Encounter  Procedures  . [place] LIMITED JOINT SPACE STRUCTURES LOW LEFT(NO LINKED CHARGES)    Order Specific Question:   Reason for Exam (SYMPTOM  OR DIAGNOSIS REQUIRED)    Answer:   L hip pain    Order Specific Question:   Preferred imaging location?    Answer:   [place]   No orders of the defined types were placed in this encounter.    Discussed warning signs or symptoms. Please see discharge instructions. Patient expresses understanding.   The above documentation has been reviewed and is accurate and complete Natasha Norman, M.D.

## 2020-12-15 NOTE — Patient Instructions (Signed)
Thank you for coming in today.  Add the step exercises.  10-30 reps 2-3x daily.   Call or go to the ER if you develop a large red swollen joint with extreme pain or oozing puss.

## 2020-12-17 ENCOUNTER — Ambulatory Visit: Payer: Medicare Other | Admitting: Family Medicine

## 2020-12-17 DIAGNOSIS — Z20822 Contact with and (suspected) exposure to covid-19: Secondary | ICD-10-CM | POA: Diagnosis not present

## 2021-01-29 ENCOUNTER — Encounter: Payer: Self-pay | Admitting: Adult Health

## 2021-01-29 DIAGNOSIS — Z85820 Personal history of malignant melanoma of skin: Secondary | ICD-10-CM | POA: Diagnosis not present

## 2021-01-29 DIAGNOSIS — L821 Other seborrheic keratosis: Secondary | ICD-10-CM | POA: Diagnosis not present

## 2021-01-29 DIAGNOSIS — D1801 Hemangioma of skin and subcutaneous tissue: Secondary | ICD-10-CM | POA: Diagnosis not present

## 2021-01-29 DIAGNOSIS — Z872 Personal history of diseases of the skin and subcutaneous tissue: Secondary | ICD-10-CM | POA: Diagnosis not present

## 2021-01-29 DIAGNOSIS — D225 Melanocytic nevi of trunk: Secondary | ICD-10-CM | POA: Diagnosis not present

## 2021-01-29 DIAGNOSIS — L905 Scar conditions and fibrosis of skin: Secondary | ICD-10-CM | POA: Diagnosis not present

## 2021-01-30 DIAGNOSIS — H2513 Age-related nuclear cataract, bilateral: Secondary | ICD-10-CM | POA: Diagnosis not present

## 2021-01-30 DIAGNOSIS — H5213 Myopia, bilateral: Secondary | ICD-10-CM | POA: Diagnosis not present

## 2021-01-30 DIAGNOSIS — H35363 Drusen (degenerative) of macula, bilateral: Secondary | ICD-10-CM | POA: Diagnosis not present

## 2021-02-10 DIAGNOSIS — Z23 Encounter for immunization: Secondary | ICD-10-CM | POA: Diagnosis not present

## 2021-02-16 NOTE — Progress Notes (Signed)
I, Wendy Poet, LAT, ATC, am serving as scribe for Dr. [name].  Natasha Norman is a 70 y.o. female who presents to [place] at [facility] today for f/u L hip pain. Pt was last seen by Dr. Georgina Snell on 12/15/20 and was given a L GT steroid injection and advised to cont her HEP. Today, pt reports that the injection helped for about one month but now pain is returning.  She con't to do her HEP.  Dx imaging: 04/13/20 L hip MRI             12/14/19 L hip/pelvis XR  Pertinent review of systems: No fevers or chills  Relevant historical information: Hypothyroidism   Exam:  BP 118/74 (BP Location: Right Arm, Patient Position: Sitting, Cuff Size: Normal)   Pulse 81   Ht 5\' 5"  (1.651 m)   Wt 129 lb (58.5 kg)   SpO2 98%   BMI 21.47 kg/m  General: Well Developed, well nourished, and in no acute distress.   MSK: Left hip normal-appearing Tender palpation greater trochanter. Normal hip motion. Hip abduction and external rotation strength are diminished.     Lab and Radiology Results EXAM: MR OF THE LEFT HIP WITHOUT CONTRAST  TECHNIQUE: Multiplanar, multisequence MR imaging was performed. No intravenous contrast was administered.  COMPARISON:  None.  FINDINGS: Both hips are normally located. Mild to moderate hip joint degenerative changes bilaterally. No findings for stress fracture or AVN. No hip joint effusion. No periarticular fluid collections to suggest a paralabral cyst.  Left anterior superior labral tear is suspected on the sagittal images.  Mild bilateral peritendinosis, left greater than right and associated mild left trochanteric bursitis. The hip and pelvic musculature are unremarkable. No muscle tear, myositis or mass.  The pubic symphysis and SI joints are intact. No pelvic fractures or bone lesions.  No significant intrapelvic abnormalities are identified. There is a 2.5 cm simple appearing left adnexal cyst  noted.  IMPRESSION: 1. Mild to moderate bilateral hip joint degenerative changes bilaterally. No findings for stress fracture or AVN. 2. Anterior superior labral tear suspected on the left. 3. Mild bilateral peritendinosis, left greater than right and associated mild left trochanteric bursitis.   Electronically Signed   By: Marijo Sanes M.D.   On: 04/15/2020 07:44 I, [name], personally (independently) visualized and performed the interpretation of the images attached in this note.     Assessment and Plan: 70 y.o. female with left lateral hip pain.  This is an ongoing chronic issue over 1 year now.  She has had moderate success with physical therapy trial last year, home exercise program, and now most recently greater trochanter injection.  However she has persistent lateral hip pain with some anterior hip pain.  Pain thought to be due predominantly to hip abductor tendinopathy and greater trochanteric bursitis.  She may have some pain due to the DJD and labrum seen on MRI that is atypical presentation.  Plan for retrial of physical therapy after discussion.  Recheck in 7 weeks.  Consider either repeat lateral injection or even intra-articular injection at next visit if not better.   PDMP not reviewed this encounter. Orders Placed This Encounter  Procedures  . Ambulatory referral to Physical Therapy    Referral Priority:   Routine    Referral Type:   Physical Medicine    Referral Reason:   Specialty Services Required    Requested Specialty:   Physical Therapy   No orders of the defined  types were placed in this encounter.    Discussed warning signs or symptoms. Please see discharge instructions. Patient expresses understanding.   The above documentation has been reviewed and is accurate and complete [name], M.D.

## 2021-02-17 ENCOUNTER — Ambulatory Visit (INDEPENDENT_AMBULATORY_CARE_PROVIDER_SITE_OTHER): Payer: Medicare Other | Admitting: Family Medicine

## 2021-02-17 ENCOUNTER — Encounter: Payer: Self-pay | Admitting: Family Medicine

## 2021-02-17 ENCOUNTER — Other Ambulatory Visit: Payer: Self-pay

## 2021-02-17 VITALS — BP 118/74 | HR 81 | Ht 65.0 in | Wt 129.0 lb

## 2021-02-17 DIAGNOSIS — M7062 Trochanteric bursitis, left hip: Secondary | ICD-10-CM

## 2021-02-17 NOTE — Patient Instructions (Signed)
Thank you for coming in today.  I've referred you to Physical Therapy.  Let us know if you don't hear from them in one week.  Recheck in about 7 weeks.   If not better I can do an injection or more stuff.

## 2021-02-18 ENCOUNTER — Ambulatory Visit (INDEPENDENT_AMBULATORY_CARE_PROVIDER_SITE_OTHER): Payer: Medicare Other | Admitting: Physical Therapy

## 2021-02-18 ENCOUNTER — Encounter: Payer: Self-pay | Admitting: Physical Therapy

## 2021-02-18 DIAGNOSIS — M25552 Pain in left hip: Secondary | ICD-10-CM | POA: Diagnosis not present

## 2021-02-18 DIAGNOSIS — M6281 Muscle weakness (generalized): Secondary | ICD-10-CM

## 2021-02-18 DIAGNOSIS — R262 Difficulty in walking, not elsewhere classified: Secondary | ICD-10-CM | POA: Diagnosis not present

## 2021-02-18 NOTE — Patient Instructions (Signed)
Access Code: [email or web address] URL: [email or web address] Date: 02/18/2021 Prepared by: Daleen Bo  Exercises Hooklying Isometric Hip Abduction with Belt - 1 x daily - 7 x weekly - 2 sets - 10 reps - 2 hold Supine Bridge with Resistance Band - 1 x daily - 7 x weekly - 2 sets - 10 reps Seated Hamstring Stretch - 2 x daily - 7 x weekly - 1 sets - 3 reps - 30 hold

## 2021-02-18 NOTE — Therapy (Signed)
[facility] [address] [place], [place], [address] Phone: [phone]   Fax:  [phone]  Physical Therapy Evaluation  Patient Details  Name: Natasha Norman MRN: [id number] Date of Birth: 15-Jan-1951 Referring Provider (PT): Dr. Georgina Snell   Encounter Date: 02/18/2021   PT End of Session - 02/18/21 1417    Visit Number 1    Number of Visits 15    Date for PT Re-Evaluation 05/19/21    Authorization Type Medicare    PT Start Time 5701    PT Stop Time 1435    PT Time Calculation (min) 50 min    Activity Tolerance Patient tolerated treatment well;No increased pain    Behavior During Therapy WFL for tasks assessed/performed           Past Medical History:  Diagnosis Date  . Cancer ([place]) 1996   Melanoma  . Chicken pox   . H/O repair of rotator cuff    Right   . Hypothyroidism   . Osteopenia   . Seasonal allergies     Past Surgical History:  Procedure Laterality Date  . BREAST BIOPSY Right 1984, 1996   Benign  . lymph node resection  1998    There were no vitals filed for this visit.    Subjective Assessment - 02/18/21 1306    Subjective Pt states that the L hip pain has been ongoing for a year and a half. She went through PT in the past and it seemed to help. She has been doing HEP from PT ever since. She does SLR 3 way, piriformis stretch, and bridges consistently. She states the steriod injection helped a little and had a second one in March and it made a huge difference for about a month. Pt describes the pain as achey and localized to the outside of hte hip. She states it is a band around the top of the leg that wraps around. The worst of the pain is right around the lateral posterior aspect of greater trochanter. She states that she can poke at the pain. She denies a deep pain. She walks every morning 2-3 miles, if not more. She states that every step hurt this morning. She states that hills, stairs, and exercise really  hurt. Aggs: figure 4 position, sleeping on R; Eases: laying, resting, maybe ice and heat on occasion. Pt endorses night pain. Pt denies unexpected weight loss. Pt denies systemic symptoms. Pt endorses locking, catching  into the L hip with standing and legs cross over.  Pt denies NT.    Pertinent History Melanoma, lymph node removal on R    How long can you sit comfortably? no issue    How long can you stand comfortably? no issue    How long can you walk comfortably? 2 miles, but inconsistent    Diagnostic tests IMPRESSION:  1. Mild to moderate bilateral hip joint degenerative changes  bilaterally. No findings for stress fracture or AVN.  2. Anterior superior labral tear suspected on the left.  3. Mild bilateral peritendinosis, left greater than right and  associated mild left trochanteric bursitis.    Currently in Pain? Yes    Pain Score 1     Pain Location Hip    Pain Orientation Left    Pain Descriptors / Indicators Sore    Pain Radiating Towards across the hip              [facility] PT Assessment - 02/18/21 0001  Assessment   Medical Diagnosis L Trochanteric bursitis    Referring Provider (PT) Dr. Georgina Snell    Prior Therapy L hip pain (same issue)      Precautions   Precautions None      Restrictions   Weight Bearing Restrictions No      Balance Screen   Has the patient fallen in the past 6 months No    Has the patient had a decrease in activity level because of a fear of falling?  No    Is the patient reluctant to leave their home because of a fear of falling?  No      Home [employer] residence      Prior Function   Level of Independence Independent      Cognition   Overall Cognitive Status Within Functional Limits for tasks assessed      Observation/Other Assessments   Other Surveys  Lower Extremity Functional Scale    Lower Extremity Functional Scale  71.3% 57/80      Functional Tests   Functional tests Squat      Squat   Comments  WFL, lumbar rounding at end range, below parallel depth      Posture/Postural Control   Posture/Postural Control No significant limitations      ROM / Strength   AROM / PROM / Strength AROM;PROM;Strength      AROM   Overall AROM Comments R hip WNL, L hip limited hip extension and ER   pain     PROM   Overall PROM Comments bilateral WFL, no pain with L      Strength   Overall Strength Comments 4/5 bilateral hips in flexion, ext, ABD, and ADD      Flexibility   Soft Tissue Assessment /Muscle Length yes    Hamstrings mod limited in supine 90/90    Quadriceps positive Ely's    ITB Taught to palpation    Piriformis WFL      Palpation   Spinal mobility WFL, no recreation of pain with CPA    Palpation comment TTP L hamstring and adductors, quadriceps, posterior aspect of greater troch, glute med/min      Special Tests    Special Tests Hip Special Tests    Hip Special Tests  Saralyn Pilar (FABER) Test;Trendelenberg Test;Anterior Hip Impingement Test;Hip Scouring      Saralyn Pilar (FABER) Test   Findings Positive      Trendelenburg Test   Findings Positive      Hip Scouring   Findings Negative      Anterior Hip Impingement Test    Findings Negative      Ambulation/Gait   Ambulation/Gait Yes    Ambulation Distance (Feet) 50 Feet    Gait Pattern Within Functional Limits   pain at heel strike and weight acceptance   Gait velocity increased pace    Stairs Yes    Gait Comments Pain with descending and ascending stairs on L      High Level Balance   High Level Balance Comments Pain with SLS into L hip                      Objective measurements completed on examination: See above findings.       [facility] Adult PT Treatment/Exercise - 02/18/21 0001      Exercises   Exercises Knee/Hip      Knee/Hip Exercises: Stretches   Passive Hamstring Stretch 30 seconds;3 reps  Knee/Hip Exercises: Supine   Other Supine Knee/Hip Exercises supine hip ABD with blue TB 5s 10x;  bridge with blue TB at knees 10x 5s hold                  PT Education - 02/18/21 1417    Education Details MOI, diagnosis, prognosis, anatomy, exercise progression, envelope of function, strength demands, joint loading forces, muscle firing, HEP, POC    Person(s) Educated Patient    Methods Explanation;Demonstration;Tactile cues;Verbal cues;Handout    Comprehension Verbalized understanding;Returned demonstration;Verbal cues required;Tactile cues required            PT Short Term Goals - 02/18/21 1430      PT SHORT TERM GOAL #1   Title Pt will become independent with HEP in order to demonstrate synthesis of PT education.    Time 2    Period Weeks    Status New      PT SHORT TERM GOAL #2   Title Pt will be able to demonstrate L SLS without pain in order to demonstrate functional improvement in LE function for self-care and daily mobility.    Time 4    Period Weeks    Status New             PT Long Term Goals - 02/18/21 1432      PT LONG TERM GOAL #1   Title Pt will become independent with final HEP in order to demonstrate synthesis of PT education.    Time 8    Period Weeks    Status New      PT LONG TERM GOAL #2   Title Pt will have an at least 9 pt improvement in LEFS measure in order to demonstrate MCID improvement in daily function.    Time 8    Period Weeks    Status New      PT LONG TERM GOAL #3   Title Pt will be able to report ability to walk for >2 miles without pain in order to demonstrate functionl improvement and tolerance to repetitive loading similar to daily exercise activity.    Time 8    Period Weeks    Status New      PT LONG TERM GOAL #4   Title Pt will be able to demonstrate ability to climb and descend stairs without pain in order to demonstrate functional improvement in LE function for return to regular community and daily mobility.   [place] - 02/18/21 1418    Clinical Impression Statement Pt  is a 70 y.o female presenting to PT eval today for CC of L hip pain. Pt presents with decreased SL strength, pain with CKC loading, and soft tisse restrictions. Pt's s/s appear consistent with a GTPS/gluteal tendinopathy. Though pt does have positive imaging for L labral defect, clinical testing and movement screen could not create traditional pain signs. PT will continue assess for possible labral defect contributions to current pain. Pt's s/s are likely due to a repetitive loading/overuse related injury at reduced envelope of function. Pt symptoms are moderately irritable and moderately sensitive with movement. Gait speed and mobility does not suggest pt is at risk for falls. Pt's impairments limit her full participation with daily exercise, community mobility, and daily function. Pt would benefit from continued skilled therapy in order to reach goals and maximize functional L LE strength and ROM for full  return to PLOF.    Personal Factors and Comorbidities Age;Time since onset of injury/illness/exacerbation;Comorbidity 1;Comorbidity 2    Examination-Activity Limitations Locomotion Level;Stairs;Transfers    Examination-Participation Restrictions Community Activity;Yard Work;Shop;Other    Stability/Clinical Decision Making Stable/Uncomplicated    Clinical Decision Making Low    Rehab Potential Good    PT Frequency 1x / week   1-2x   PT Duration 12 weeks   POC written for 12 weeks. Likely to D/C at 8 weeks   PT Next Visit Plan review HEP, glute med wall lean, lateral mulligan hip mob, STS with band    PT Home Exercise Plan [email or web address]    Consulted and Agree with Plan of Care Patient           Patient will benefit from skilled therapeutic intervention in order to improve the following deficits and impairments:  Abnormal gait,Pain,Impaired flexibility,Difficulty walking,Decreased balance,Decreased activity tolerance,Decreased range of motion,Decreased strength,Hypomobility,Decreased mobility,Increased  muscle spasms  Visit Diagnosis: Difficulty walking  Pain in left hip  Muscle weakness (generalized)     Problem List Patient Active Problem List   Diagnosis Date Noted  . Trochanteric bursitis, left hip 01/10/2020  . Hypothyroid 02/12/2016    Daleen Bo PT, DPT 02/18/21 2:36 PM   [facility] [address] [place], [place], [address] Phone: [phone]   Fax:  [phone]  Name: Natasha Norman MRN: [id number] Date of Birth: 26-Jan-1951

## 2021-02-25 ENCOUNTER — Other Ambulatory Visit: Payer: Self-pay

## 2021-02-25 ENCOUNTER — Ambulatory Visit (INDEPENDENT_AMBULATORY_CARE_PROVIDER_SITE_OTHER): Payer: Medicare Other | Admitting: Physical Therapy

## 2021-02-25 ENCOUNTER — Encounter: Payer: Self-pay | Admitting: Physical Therapy

## 2021-02-25 DIAGNOSIS — R262 Difficulty in walking, not elsewhere classified: Secondary | ICD-10-CM | POA: Diagnosis not present

## 2021-02-25 DIAGNOSIS — M25552 Pain in left hip: Secondary | ICD-10-CM | POA: Diagnosis not present

## 2021-02-25 DIAGNOSIS — M6281 Muscle weakness (generalized): Secondary | ICD-10-CM

## 2021-02-25 NOTE — Therapy (Signed)
[facility] [address] [place], [place], [address] Phone: [phone]   Fax:  [phone]  Physical Therapy Treatment  Patient Details  Name: Natasha Norman MRN: [id number] Date of Birth: 08/10/1951 Referring Provider (PT): Dr. Georgina Snell   Encounter Date: 02/25/2021   PT End of Session - 02/25/21 1643    Visit Number 2    Number of Visits 15    Date for PT Re-Evaluation 05/19/21    Authorization Type Medicare    PT Start Time 9528    PT Stop Time 1600    PT Time Calculation (min) 45 min    Activity Tolerance Patient tolerated treatment well;No increased pain    Behavior During Therapy WFL for tasks assessed/performed           Past Medical History:  Diagnosis Date  . Cancer ([place]) 1996   Melanoma  . Chicken pox   . H/O repair of rotator cuff    Right   . Hypothyroidism   . Osteopenia   . Seasonal allergies     Past Surgical History:  Procedure Laterality Date  . BREAST BIOPSY Right 1984, 1996   Benign  . lymph node resection  1998    There were no vitals filed for this visit.   Subjective Assessment - 02/25/21 1514    Subjective Pt states that the pain might be a little better. She states the pain is less intense when it happens. She is walking about 1.5-2 miles at this point. She notices maybe some more anterior thigh pain vs postero-lateral.    Pertinent History Melanoma, lymph node removal on R    How long can you sit comfortably? no issue    How long can you stand comfortably? no issue    How long can you walk comfortably? 2 miles, but inconsistent    Diagnostic tests IMPRESSION:  1. Mild to moderate bilateral hip joint degenerative changes  bilaterally. No findings for stress fracture or AVN.  2. Anterior superior labral tear suspected on the left.  3. Mild bilateral peritendinosis, left greater than right and  associated mild left trochanteric bursitis.    Currently in Pain? Yes    Pain Score 2     Pain  Location Hip    Pain Orientation Left    Pain Descriptors / Indicators Sore                             [facility] Adult PT Treatment/Exercise - 02/25/21 0001      Ambulation/Gait   Ambulation/Gait Yes    Ambulation Distance (Feet) 50 Feet    Gait Pattern Within Functional Limits   pain at heel strike and weight acceptance   Gait velocity increased pace    Stairs Yes    Gait Comments Pain with descending and ascending stairs on L      Posture/Postural Control   Posture/Postural Control No significant limitations      Exercises   Exercises Knee/Hip      Knee/Hip Exercises: Stretches   Passive Hamstring Stretch 30 seconds;3 reps    Quad Stretch Limitations prone quad stretch with strap 30s 3x      Knee/Hip Exercises: Standing   Forward Step Up Limitations 3x12   short side   Hip ABD CKC  Hip hike, R foot on stair 2x10     Knee/Hip Exercises: Supine   Other Supine Knee/Hip Exercises supine hip ABD with blue TB  5s 10x; bridge with blue TB at knees 10x 5s hold      Manual Therapy   Manual Therapy Joint mobilization    Joint Mobilization LAD L LE with oscillation; lateral hip mob mulligan belt grade III                  PT Education - 02/25/21 1643    Education Details anatomy, exercise progression, envelope of function, strength demands, joint loading forces, walking progression, muscle firing, HEP    Person(s) Educated Patient    Methods Explanation;Demonstration;Tactile cues;Verbal cues;Handout    Comprehension Verbalized understanding;Returned demonstration;Verbal cues required;Tactile cues required            PT Short Term Goals - 02/18/21 1430      PT SHORT TERM GOAL #1   Title Pt will become independent with HEP in order to demonstrate synthesis of PT education.    Time 2    Period Weeks    Status New      PT SHORT TERM GOAL #2   Title Pt will be able to demonstrate L SLS without pain in order to demonstrate functional improvement in LE  function for self-care and daily mobility.    Time 4    Period Weeks    Status New             PT Long Term Goals - 02/18/21 1432      PT LONG TERM GOAL #1   Title Pt will become independent with final HEP in order to demonstrate synthesis of PT education.    Time 8    Period Weeks    Status New      PT LONG TERM GOAL #2   Title Pt will have an at least 9 pt improvement in LEFS measure in order to demonstrate MCID improvement in daily function.    Time 8    Period Weeks    Status New      PT LONG TERM GOAL #3   Title Pt will be able to report ability to walk for >2 miles without pain in order to demonstrate functionl improvement and tolerance to repetitive loading similar to daily exercise activity.    Time 8    Period Weeks    Status New      PT LONG TERM GOAL #4   Title Pt will be able to demonstrate ability to climb and descend stairs without pain in order to demonstrate functional improvement in LE function for return to regular community and daily mobility.    Time 8    Period Weeks    Status New                 Plan - 02/25/21 1644    Clinical Impression Statement Pt presented with L quadriceps tone at today's session that was hypersensitive and spasmed to touch. Direct STM not able to be performed. However, pt was able to increased quad length with self stretch. Pt responded well to hip mob and had slight decrease in hip pain following. Pt had good retention of HEP technique and required minimal cuing. However, with CKC eexericse, pt required extensive cuing for level pelvis and hip ABD and ER on L. Pt did have increased muscle soreness and fatigue following, suggesting L hip ABD weakness in full WB position. Plan to trial STS with hip ABD at next session and consider adding step up to HEP. Pt would benefit from continued skilled therapy in order to reach goals and  maximize functional L LEstrength and ROM for full return to PLOF.    Personal Factors and  Comorbidities Age;Time since onset of injury/illness/exacerbation;Comorbidity 1;Comorbidity 2    Examination-Activity Limitations Locomotion Level;Stairs;Transfers    Examination-Participation Restrictions Community Activity;Yard Work;Shop;Other    Stability/Clinical Decision Making Stable/Uncomplicated    Rehab Potential Good    PT Frequency 1x / week   1-2x   PT Duration 12 weeks   POC written for 12 weeks. Likely to D/C at 8 weeks   PT Next Visit Plan review HEP, glute med wall lean, lateral mulligan hip mob, STS with band, review step up    PT Home Exercise Plan [email or web address]    Consulted and Agree with Plan of Care Patient           Patient will benefit from skilled therapeutic intervention in order to improve the following deficits and impairments:  Abnormal gait,Pain,Impaired flexibility,Difficulty walking,Decreased balance,Decreased activity tolerance,Decreased range of motion,Decreased strength,Hypomobility,Decreased mobility,Increased muscle spasms  Visit Diagnosis: Pain in left hip  Difficulty walking  Muscle weakness (generalized)     Problem List Patient Active Problem List   Diagnosis Date Noted  . Trochanteric bursitis, left hip 01/10/2020  . Hypothyroid 02/12/2016    Daleen Bo PT, DPT 02/25/21 5:00 PM   [facility] [address] [place], [place], [address] Phone: [phone]   Fax:  [phone]  Name: Natasha Norman MRN: [id number] Date of Birth: Oct 05, 1950

## 2021-02-25 NOTE — Patient Instructions (Signed)
Access Code: [email or web address] URL: [email or web address] Date: 02/25/2021 Prepared by: Daleen Bo  Exercises Hooklying Isometric Hip Abduction with Belt - 1 x daily - 7 x weekly - 2 sets - 10 reps - 2 hold Supine Bridge with Resistance Band - 1 x daily - 7 x weekly - 2 sets - 10 reps Seated Hamstring Stretch - 2 x daily - 7 x weekly - 1 sets - 3 reps - 30 hold Prone Quadriceps Stretch with Strap - 2 x daily - 7 x weekly - 1 sets - 3 reps - 30 hold

## 2021-02-27 ENCOUNTER — Ambulatory Visit (INDEPENDENT_AMBULATORY_CARE_PROVIDER_SITE_OTHER): Payer: Medicare Other | Admitting: Physical Therapy

## 2021-02-27 ENCOUNTER — Encounter: Payer: Self-pay | Admitting: Physical Therapy

## 2021-02-27 ENCOUNTER — Other Ambulatory Visit: Payer: Self-pay

## 2021-02-27 DIAGNOSIS — M6281 Muscle weakness (generalized): Secondary | ICD-10-CM | POA: Diagnosis not present

## 2021-02-27 DIAGNOSIS — M25552 Pain in left hip: Secondary | ICD-10-CM | POA: Diagnosis not present

## 2021-02-27 DIAGNOSIS — R262 Difficulty in walking, not elsewhere classified: Secondary | ICD-10-CM | POA: Diagnosis not present

## 2021-02-27 NOTE — Therapy (Signed)
[facility] [address] [place], [place], [address] Phone: [phone]   Fax:  [phone]  Physical Therapy Treatment  Patient Details  Name: Natasha Norman MRN: [id number] Date of Birth: March 22, 1951 Referring Provider (PT): Dr. Georgina Snell   Encounter Date: 02/27/2021   PT End of Session - 02/27/21 1101    Visit Number 3    Number of Visits 15    Date for PT Re-Evaluation 05/19/21    Authorization Type Medicare    PT Start Time 1100    PT Stop Time 1145    PT Time Calculation (min) 45 min    Activity Tolerance Patient tolerated treatment well;No increased pain    Behavior During Therapy WFL for tasks assessed/performed           Past Medical History:  Diagnosis Date  . Cancer ([place]) 1996   Melanoma  . Chicken pox   . H/O repair of rotator cuff    Right   . Hypothyroidism   . Osteopenia   . Seasonal allergies     Past Surgical History:  Procedure Laterality Date  . BREAST BIOPSY Right 1984, 1996   Benign  . lymph node resection  1998    There were no vitals filed for this visit.   Subjective Assessment - 02/27/21 1100    Subjective Pt states she was more sore down the front of the thigh and lateral. She states it was sore with walking but has decreased since yesterday.    Pertinent History Melanoma, lymph node removal on R    How long can you sit comfortably? no issue    How long can you stand comfortably? no issue    How long can you walk comfortably? 2 miles, but inconsistent    Diagnostic tests IMPRESSION:  1. Mild to moderate bilateral hip joint degenerative changes  bilaterally. No findings for stress fracture or AVN.  2. Anterior superior labral tear suspected on the left.  3. Mild bilateral peritendinosis, left greater than right and  associated mild left trochanteric bursitis.    Currently in Pain? Yes    Pain Score 1     Pain Orientation Left    Pain Descriptors / Indicators Sore                              [facility] Adult PT Treatment/Exercise - 02/27/21 0001      Ambulation/Gait   Ambulation/Gait Yes    Ambulation Distance (Feet) 50 Feet    Gait Pattern Within Functional Limits   pain at heel strike and weight acceptance   Gait velocity increased pace    Stairs Yes    Gait Comments Pain with descending and ascending stairs on L      Posture/Postural Control   Posture/Postural Control No significant limitations      Exercises   Exercises Knee/Hip      Knee/Hip Exercises: Stretches   Passive Hamstring Stretch 30 seconds;3 reps    Quad Stretch Limitations prone quad stretch with strap 30s 3x   towel under knee Supine piriformis stretch 30s 3x (D/C due to pain)     Knee/Hip Exercises: Aerobic   Recumbent Bike L1 3 min warmup   emphasize knee alignment     Knee/Hip Exercises: Standing   Forward Step Up Limitations 2x10   short side (D/C due to knee pain)   Other Standing Knee Exercises STS with blue band 2x10  Knee/Hip Exercises: Seated   Long Arc Quad Limitations 3 lbs 20x      Knee/Hip Exercises: Supine   Other Supine Knee/Hip Exercises --      Manual Therapy   Manual Therapy Joint mobilization;Soft tissue mobilization    Joint Mobilization lateral hip mob mulligan belt grade III    Soft tissue mobilization L VL and rec fem                  PT Education - 02/27/21 1101    Education Details exercise progression, envelope of function, strength demands, joint loading forces, walking progression, muscle firing, HEP, DOMS expectations    Person(s) Educated Patient    Methods Explanation;Demonstration;Tactile cues;Verbal cues;Handout    Comprehension Verbalized understanding;Returned demonstration;Verbal cues required;Tactile cues required            PT Short Term Goals - 02/18/21 1430      PT SHORT TERM GOAL #1   Title Pt will become independent with HEP in order to demonstrate synthesis of PT education.    Time 2     Period Weeks    Status New      PT SHORT TERM GOAL #2   Title Pt will be able to demonstrate L SLS without pain in order to demonstrate functional improvement in LE function for self-care and daily mobility.    Time 4    Period Weeks    Status New             PT Long Term Goals - 02/18/21 1432      PT LONG TERM GOAL #1   Title Pt will become independent with final HEP in order to demonstrate synthesis of PT education.    Time 8    Period Weeks    Status New      PT LONG TERM GOAL #2   Title Pt will have an at least 9 pt improvement in LEFS measure in order to demonstrate MCID improvement in daily function.    Time 8    Period Weeks    Status New      PT LONG TERM GOAL #3   Title Pt will be able to report ability to walk for >2 miles without pain in order to demonstrate functionl improvement and tolerance to repetitive loading similar to daily exercise activity.    Time 8    Period Weeks    Status New      PT LONG TERM GOAL #4   Title Pt will be able to demonstrate ability to climb and descend stairs without pain in order to demonstrate functional improvement in LE function for return to regular community and daily mobility.    Time 8    Period Weeks    Status New                 Plan - 02/27/21 1339    Clinical Impression Statement Pt presented today with increased L quad and hip soreness that is consistent with DOMS. Pt was able to tolerate gentle STM today followed by dynamic warmup and had decreased soreness and pain following. Pt was then able to perform DL CKC exericse and could not tolerate SL step up due to increased anterior knee pain. Pt required increased cuing for knee alignment during exercise but had improvement of sx with exercise. Pt HEP updated to 3-4x a week in ordre to promote better recovery period.  Pt would benefit from continued skilled therapy in order to reach goals and  maximize functional L LEstrength and ROM for full return to PLOF.     Personal Factors and Comorbidities Age;Time since onset of injury/illness/exacerbation;Comorbidity 1;Comorbidity 2    Examination-Activity Limitations Locomotion Level;Stairs;Transfers    Examination-Participation Restrictions Community Activity;Yard Work;Shop;Other    Stability/Clinical Decision Making Stable/Uncomplicated    Rehab Potential Good    PT Frequency 1x / week   1-2x   PT Duration 12 weeks   POC written for 12 weeks. Likely to D/C at 8 weeks   PT Next Visit Plan review HEP, glute med wall lean, LAQ 10lbs, STS with band review    PT Home Exercise Plan [email or web address]    Consulted and Agree with Plan of Care Patient           Patient will benefit from skilled therapeutic intervention in order to improve the following deficits and impairments:  Abnormal gait,Pain,Impaired flexibility,Difficulty walking,Decreased balance,Decreased activity tolerance,Decreased range of motion,Decreased strength,Hypomobility,Decreased mobility,Increased muscle spasms  Visit Diagnosis: Pain in left hip  Difficulty walking  Muscle weakness (generalized)     Problem List Patient Active Problem List   Diagnosis Date Noted  . Trochanteric bursitis, left hip 01/10/2020  . Hypothyroid 02/12/2016    Daleen Bo PT, DPT 02/27/21 1:52 PM   [facility] [address] [place], [place], [address] Phone: [phone]   Fax:  [phone]  Name: Natasha Norman MRN: [id number] Date of Birth: Jul 17, 1951

## 2021-02-27 NOTE — Patient Instructions (Signed)
Access Code: [email or web address] URL: [email or web address] Date: 02/27/2021 Prepared by: Daleen Bo  Exercises Seated Hamstring Stretch - 2 x daily - 7 x weekly - 1 sets - 3 reps - 30 hold Prone Quadriceps Stretch with Strap - 2 x daily - 7 x weekly - 1 sets - 3 reps - 30 hold Supine Bridge with Resistance Band - 1 x daily - 7 x weekly - 2 sets - 10 reps Step Up - 1 x daily - 3-4 x weekly - 2 sets - 10 reps Sit to Stand with Resistance Around Legs - 1 x daily - 3-4 x weekly - 2 sets - 10 reps

## 2021-03-05 ENCOUNTER — Encounter: Payer: Self-pay | Admitting: Physical Therapy

## 2021-03-05 ENCOUNTER — Other Ambulatory Visit: Payer: Self-pay

## 2021-03-05 ENCOUNTER — Ambulatory Visit (INDEPENDENT_AMBULATORY_CARE_PROVIDER_SITE_OTHER): Payer: Medicare Other | Admitting: Physical Therapy

## 2021-03-05 DIAGNOSIS — R262 Difficulty in walking, not elsewhere classified: Secondary | ICD-10-CM

## 2021-03-05 DIAGNOSIS — M6281 Muscle weakness (generalized): Secondary | ICD-10-CM | POA: Diagnosis not present

## 2021-03-05 DIAGNOSIS — M25552 Pain in left hip: Secondary | ICD-10-CM

## 2021-03-05 NOTE — Therapy (Signed)
[facility] [address] [place], [place], [address] Phone: [phone]   Fax:  [phone]  Physical Therapy Treatment  Patient Details  Name: Natasha Norman MRN: [id number] Date of Birth: [birth year] Referring Provider (PT): Dr. Georgina Snell   Encounter Date: 03/05/2021   PT End of Session - 03/05/21 1106     Visit Number 4    Number of Visits 15    Date for PT Re-Evaluation 05/19/21    Authorization Type Medicare    PT Start Time 1107    PT Stop Time 1150    PT Time Calculation (min) 43 min    Activity Tolerance Patient tolerated treatment well;No increased pain    Behavior During Therapy WFL for tasks assessed/performed             Past Medical History:  Diagnosis Date   Cancer ([place]) 1996   Melanoma   Chicken pox    H/O repair of rotator cuff    Right    Hypothyroidism    Osteopenia    Seasonal allergies     Past Surgical History:  Procedure Laterality Date   BREAST BIOPSY Right 1984, 1996   Benign   lymph node resection  1998    There were no vitals filed for this visit.   Subjective Assessment - 03/05/21 1109     Subjective Pt states the lateral thigh soreness is better. She has more soreness wrapping around the hip and into the groin.    Pertinent History Melanoma, lymph node removal on R    How long can you sit comfortably? no issue    How long can you stand comfortably? no issue    How long can you walk comfortably? 2 miles, but inconsistent    Diagnostic tests IMPRESSION:  1. Mild to moderate bilateral hip joint degenerative changes  bilaterally. No findings for stress fracture or AVN.  2. Anterior superior labral tear suspected on the left.  3. Mild bilateral peritendinosis, left greater than right and  associated mild left trochanteric bursitis.    Currently in Pain? No/denies    Pain Score 0-No pain                               [facility] Adult PT Treatment/Exercise - 03/05/21 0001                                     Posture/Postural Control   Posture/Postural Control No significant limitations      Exercises   Exercises Knee/Hip      Knee/Hip Exercises: Stretches   Passive Hamstring Stretch --    Sports administrator Limitations prone quad stretch with strap 30s 3x   towel under knee   Other Knee/Hip Stretches thomas EOB 30s 3x      Knee/Hip Exercises: Aerobic   Recumbent Bike --      Knee/Hip Exercises: Standing   Forward Step Up Limitations --    Other Standing Knee Exercises STS with blue band 2x10      Knee/Hip Exercises: Seated   Long Arc Quad Limitations 3 lbs 3x10    Other Seated Knee/Hip Exercises seated pelvic tilting 20x    Other Seated Knee/Hip Exercises hip ABD iso prior to standing    Hamstring Curl 2 sets;10 reps    Hamstring Limitations 5lbs      Manual  Therapy   Manual Therapy Joint mobilization;Soft tissue mobilization    Joint Mobilization lateral hip mob mulligan belt grade III    Soft tissue mobilization L VL and rec fem                      PT Short Term Goals - 02/18/21 1430       PT SHORT TERM GOAL #1   Title Pt will become independent with HEP in order to demonstrate synthesis of PT education.    Time 2    Period Weeks    Status New      PT SHORT TERM GOAL #2   Title Pt will be able to demonstrate L SLS without pain in order to demonstrate functional improvement in LE function for self-care and daily mobility.    Time 4    Period Weeks    Status New               PT Long Term Goals - 02/18/21 1432       PT LONG TERM GOAL #1   Title Pt will become independent with final HEP in order to demonstrate synthesis of PT education.    Time 8    Period Weeks    Status New      PT LONG TERM GOAL #2   Title Pt will have an at least 9 pt improvement in LEFS measure in order to demonstrate MCID improvement in daily function.    Time 8    Period Weeks    Status New      PT LONG TERM GOAL #3   Title Pt  will be able to report ability to walk for >2 miles without pain in order to demonstrate functionl improvement and tolerance to repetitive loading similar to daily exercise activity.    Time 8    Period Weeks    Status New      PT LONG TERM GOAL #4   Title Pt will be able to demonstrate ability to climb and descend stairs without pain in order to demonstrate functional improvement in LE function for return to regular community and daily mobility.    Time 8    Period Weeks    Status New                   Plan - 03/05/21 1132     Clinical Impression Statement Pt had relief of lateral hip and ant thigh pain following mulligan hip mobilization and LAD. Pt appears to have deep C-sign pain follwing last session and with more CKC exercise suggesting irritation of  L hip labral defect. Pt was able to tolerate hip stretching without pain and pt was given hip self distraction at stair for home. Pt's lateral thigh pain is improving and has less sensitivity to loading but therapy session and exercises will be tailored to decreased L hip joint irritation. Pt able to progress OKC quad and HS strengthening. Plan to continue with OKC strengthening as tolerated. Pt would benefit from continued skilled therapy in order to reach goals and maximize functional L LE strength and ROM for return to PLOF and prevention of further functional decline.    Personal Factors and Comorbidities Age;Time since onset of injury/illness/exacerbation;Comorbidity 1;Comorbidity 2    Examination-Activity Limitations Locomotion Level;Stairs;Transfers    Examination-Participation Restrictions Community Activity;Yard Work;Shop;Other    Stability/Clinical Decision Making Stable/Uncomplicated    Rehab Potential Good    PT Frequency 1x / week  1-2x   PT Duration 12 weeks   POC written for 12 weeks. Likely to D/C at 8 weeks   PT Next Visit Plan review HEP, LAQ 10 lbs, HS curl 5lbs, cat cow, deadbug LE    PT Home Exercise Plan  [email or web address]    Consulted and Agree with Plan of Care Patient             Patient will benefit from skilled therapeutic intervention in order to improve the following deficits and impairments:  Abnormal gait, Pain, Impaired flexibility, Difficulty walking, Decreased balance, Decreased activity tolerance, Decreased range of motion, Decreased strength, Hypomobility, Decreased mobility, Increased muscle spasms  Visit Diagnosis: Pain in left hip  Difficulty walking  Muscle weakness (generalized)     Problem List Patient Active Problem List   Diagnosis Date Noted   Trochanteric bursitis, left hip 01/10/2020   Hypothyroid 02/12/2016   Daleen Bo PT, DPT 03/05/21 12:14 PM   [facility] [place], [place], [address] Phone: [phone]   Fax:  [phone]  Name: Natasha Norman MRN: [id number] Date of Birth: Mar 06, 1951

## 2021-03-18 ENCOUNTER — Encounter: Payer: Medicare Other | Admitting: Physical Therapy

## 2021-03-25 ENCOUNTER — Encounter: Payer: Self-pay | Admitting: Physical Therapy

## 2021-03-25 ENCOUNTER — Ambulatory Visit (INDEPENDENT_AMBULATORY_CARE_PROVIDER_SITE_OTHER): Payer: Medicare Other | Admitting: Physical Therapy

## 2021-03-25 ENCOUNTER — Other Ambulatory Visit: Payer: Self-pay

## 2021-03-25 DIAGNOSIS — R262 Difficulty in walking, not elsewhere classified: Secondary | ICD-10-CM | POA: Diagnosis not present

## 2021-03-25 DIAGNOSIS — M6281 Muscle weakness (generalized): Secondary | ICD-10-CM | POA: Diagnosis not present

## 2021-03-25 DIAGNOSIS — M25552 Pain in left hip: Secondary | ICD-10-CM

## 2021-03-25 NOTE — Therapy (Signed)
[facility] [address] [place], [place], [address] Phone: [phone]   Fax:  [phone]  Physical Therapy Progress Note  Patient Details  Name: Natasha Norman MRN: [id number] Date of Birth: May 04, 1951 Referring Provider (PT): Dr. Georgina Snell   Encounter Date: 03/25/2021   PT End of Session - 03/25/21 1223     Visit Number 5    Number of Visits 15    Date for PT Re-Evaluation 05/19/21    Authorization Type Medicare    PT Start Time 1105    PT Stop Time 1150    PT Time Calculation (min) 45 min    Activity Tolerance Patient tolerated treatment well;No increased pain    Behavior During Therapy WFL for tasks assessed/performed             Past Medical History:  Diagnosis Date   Cancer ([place]) 1996   Melanoma   Chicken pox    H/O repair of rotator cuff    Right    Hypothyroidism    Osteopenia    Seasonal allergies     Past Surgical History:  Procedure Laterality Date   BREAST BIOPSY Right 1984, 1996   Benign   lymph node resection  1998    There were no vitals filed for this visit.   Subjective Assessment - 03/25/21 1104     Subjective Pt states the hips has "good and bad days." Two days ago, the hip was worse and she had pain with transfers. She states that she has had days where it is very uncomfortable to even sleep. She will have deep hip pain into the outside and anterior portion of the hip. Yesterday and today are good. Pt states the lateral hip pain no longer bothers her much.    Pertinent History Melanoma, lymph node removal on R    How long can you sit comfortably? no issue    How long can you stand comfortably? no issue    How long can you walk comfortably? 2 miles, but inconsistent    Diagnostic tests IMPRESSION:  1. Mild to moderate bilateral hip joint degenerative changes  bilaterally. No findings for stress fracture or AVN.  2. Anterior superior labral tear suspected on the left.  3. Mild bilateral  peritendinosis, left greater than right and  associated mild left trochanteric bursitis.    Currently in Pain? Yes    Pain Score 2     Pain Location Hip                [facility] PT Assessment - 03/25/21 0001       Assessment   Medical Diagnosis L Trochanteric bursitis    Referring Provider (PT) Dr. Georgina Snell    Prior Therapy L hip pain (same issue)      Precautions   Precautions None      Restrictions   Weight Bearing Restrictions No      Balance Screen   Has the patient fallen in the past 6 months No      [place] residence      Prior Function   Level of Independence Independent      Cognition   Overall Cognitive Status Within Functional Limits for tasks assessed      Observation/Other Assessments   Other Surveys  Lower Extremity Functional Scale    Lower Extremity Functional Scale  to be addended      Functional Tests   Functional tests Squat  Squat   Comments WFL, lumbar rounding at end range, below parallel depth      AROM   Overall AROM Comments R hip WNL, L hip limited hip extension and ER, sig pain with fig 4 sitting   pain     PROM   Overall PROM Comments bilateral WFL, no pain with L      Strength   Overall Strength Comments 4/5 bilateral hips in flexion, ext, ABD, and ADD      Flexibility   ITB Taught to palpation    Piriformis WFL      Palpation   Spinal mobility WFL, no recreation of pain with CPA    Palpation comment TTP quadriceps, glute med/min, L L/S paraspinals and QL      Special Tests    Special Tests Hip Special Tests    Hip Special Tests  Saralyn Pilar (FABER) Test;Trendelenberg Test;Anterior Hip Impingement Test;Hip Scouring      Saralyn Pilar (FABER) Test   Findings Positive      Hip Scouring   Findings Negative      Anterior Hip Impingement Test    Findings Positive      Ambulation/Gait   Ambulation/Gait Yes    Ambulation Distance (Feet) 50 Feet    Gait Pattern Within Functional Limits     Gait velocity WNL    Stairs Yes    Gait Comments Pain with descending and ascending stairs on L      High Level Balance   High Level Balance Comments Pain with SLS into L hip                           [facility] Adult PT Treatment/Exercise - 03/25/21 0001       Posture/Postural Control   Posture/Postural Control No significant limitations      Exercises   Exercises Knee/Hip      Knee/Hip Exercises: Public affairs consultant Limitations --    Other Knee/Hip Stretches --      Knee/Hip Exercises: Standing   Other Standing Knee Exercises STS   reviewed for increased resistance at home; no increase in pain     Knee/Hip Exercises: Seated   Long Arc Quad Limitations --    Other Seated Knee/Hip Exercises seated pelvic tilting 20x    Other Seated Knee/Hip Exercises --    Hamstring Curl --    Hamstring Limitations --      Knee/Hip Exercises: Supine   Other Supine Knee/Hip Exercises PPT 20x    Other Supine Knee/Hip Exercises self massage quadriceps and L/S paraspinals      Manual Therapy   Manual Therapy Joint mobilization;Soft tissue mobilization    Joint Mobilization L lateral hip mob grade III, LAD with oscillation grade III; L2-5 CPA and UPA grade III    Soft tissue mobilization L VL and rec fem, QL and L/S para                    PT Education - 03/25/21 1223     Education Details testing results, labral anatomy, POC, HEP, pain management, referral back to MD    Person(s) Educated Patient    Methods Explanation;Demonstration;Tactile cues;Verbal cues    Comprehension Verbalized understanding;Returned demonstration;Verbal cues required;Tactile cues required              PT Short Term Goals - 02/18/21 1430       PT SHORT TERM GOAL #1   Title Pt will become  independent with HEP in order to demonstrate synthesis of PT education.    Time 2    Period Weeks    Status New      PT SHORT TERM GOAL #2   Title Pt will be able to demonstrate L SLS  without pain in order to demonstrate functional improvement in LE function for self-care and daily mobility.    Time 4    Period Weeks    Status New               PT Long Term Goals - 02/18/21 1432       PT LONG TERM GOAL #1   Title Pt will become independent with final HEP in order to demonstrate synthesis of PT education.    Time 8    Period Weeks    Status New      PT LONG TERM GOAL #2   Title Pt will have an at least 9 pt improvement in LEFS measure in order to demonstrate MCID improvement in daily function.    Time 8    Period Weeks    Status New      PT LONG TERM GOAL #3   Title Pt will be able to report ability to walk for >2 miles without pain in order to demonstrate functionl improvement and tolerance to repetitive loading similar to daily exercise activity.    Time 8    Period Weeks    Status New      PT LONG TERM GOAL #4   Title Pt will be able to demonstrate ability to climb and descend stairs without pain in order to demonstrate functional improvement in LE function for return to regular community and daily mobility.    Time 8    Period Weeks    Status New                   Plan - 03/25/21 1233     Clinical Impression Statement Pt's lateral hip pain has improved and she has improved strength with hip ABD but continued fluctuations in hip pain suggest potentially hip joint pathology. PT trajectory is not as expected. Clinical presentation suggests that previously imaged labral defect may be continuing to cause pain and irritation. Pt reports C-sign pain, deep posterior hip pain, anterior groin pain, and occasional back pain. Pt also does not tolerate FABER or FADDIR position without increased deep hip pain. Pt given edu about PT POC and further need for medical management. Pt to schedule with Dr. Georgina Snell to evaluate need for specialist referral. Pt would benefit from continued skilled therapy in order to reach goals and maximize functional L LE strength  and ROM for return to PLOF and prevention of further functional decline.    Personal Factors and Comorbidities Age;Time since onset of injury/illness/exacerbation;Comorbidity 1;Comorbidity 2    Examination-Activity Limitations Locomotion Level;Stairs;Transfers    Examination-Participation Restrictions Community Activity;Yard Work;Shop;Other    Stability/Clinical Decision Making Stable/Uncomplicated    Rehab Potential Good    PT Frequency 1x / week   1-2x   PT Duration 12 weeks   POC written for 12 weeks. Likely to D/C at 8 weeks   PT Next Visit Plan LEFS Measure   [place] and Agree with Plan of Care Patient             Patient will benefit from skilled therapeutic intervention in order to improve the following deficits and impairments:  Abnormal gait, Pain,  Impaired flexibility, Difficulty walking, Decreased balance, Decreased activity tolerance, Decreased range of motion, Decreased strength, Hypomobility, Decreased mobility, Increased muscle spasms  Visit Diagnosis: Pain in left hip  Difficulty walking  Muscle weakness (generalized)     Problem List Patient Active Problem List   Diagnosis Date Noted   Trochanteric bursitis, left hip 01/10/2020   Hypothyroid 02/12/2016   Daleen Bo PT, DPT 03/25/21 1:10 PM   [facility] [address] [place], [place], [address] Phone: [phone]   Fax:  [phone]  Name: Sami Roes MRN: [id number] Date of Birth: 03-09-1951

## 2021-03-25 NOTE — Patient Instructions (Signed)
Access Code: [email or web address] URL: [email or web address] Date: 03/25/2021 Prepared by: Daleen Bo  Exercises Seated Hamstring Stretch - 2 x daily - 7 x weekly - 1 sets - 3 reps - 30 hold Prone Quadriceps Stretch with Strap - 2 x daily - 7 x weekly - 1 sets - 3 reps - 30 hold Hooklying Isometric Hip Abduction with Belt - 1 x daily - 3-4 x weekly - 3 sets - 10 reps - 5 hold Supine Bridge with Resistance Band - 1 x daily - 3-4 x weekly - 2 sets - 10 reps Sit to Stand with Resistance Around Legs - 1 x daily - 3-4 x weekly - 3 sets - 10 reps Seated Pelvic Tilt - 1 x daily - 7 x weekly - 3 sets - 10 reps Supine Posterior Pelvic Tilt - 1 x daily - 3-4 x weekly - 2 sets - 10 reps - 2 hold

## 2021-04-07 ENCOUNTER — Other Ambulatory Visit: Payer: Self-pay

## 2021-04-07 ENCOUNTER — Ambulatory Visit (INDEPENDENT_AMBULATORY_CARE_PROVIDER_SITE_OTHER): Payer: Medicare Other | Admitting: Family Medicine

## 2021-04-07 ENCOUNTER — Ambulatory Visit: Payer: Self-pay

## 2021-04-07 ENCOUNTER — Ambulatory Visit (INDEPENDENT_AMBULATORY_CARE_PROVIDER_SITE_OTHER): Payer: Medicare Other

## 2021-04-07 VITALS — BP 136/76 | HR 88 | Ht 65.0 in | Wt 129.4 lb

## 2021-04-07 DIAGNOSIS — M25552 Pain in left hip: Secondary | ICD-10-CM

## 2021-04-07 NOTE — Progress Notes (Signed)
I, Natasha Norman, LAT, ATC acting as a scribe for Natasha Leader, MD.  Natasha Norman is a 70 y.o. female who presents to [place] at [facility] today for f/u L hip pain. Pt was last seen by Dr. Georgina Norman on 02/17/21 and was advised to plan for a retrial of PT, of which she's completed 5 visits. Today, pt reports L hip is still painful, but different. The pain along the lateral aspect hip has resolved. Pt c/o pain all night last night, but now is somewhat better. Pt locates pain to the anterior aspect of L hip, into L groin, and very deep into L buttock.  Dx imaging: 04/13/20 L hip MRI             12/14/19 L hip/pelvis XR  Pertinent review of systems: No fevers or chills  Relevant historical information: History of left hip osteoarthritis seen on MRI July 2021.   Exam:  BP 136/76 (BP Location: Left Arm, Patient Position: Sitting, Cuff Size: Normal)   Pulse 88   Ht 5\' 5"  (1.651 m)   Wt 129 lb 6.4 oz (58.7 kg)   SpO2 98%   BMI 21.53 kg/m  General: Well Developed, well nourished, and in no acute distress.   MSK: Left hip normal-appearing Slightly reduced motion pain with internal rotation and flexion.     Lab and Radiology Results  Procedure: Real-time Ultrasound Guided Injection of left hip femoral acetabular joint Device: Philips Affiniti 50G Images permanently stored and available for review in PACS Verbal informed consent obtained.  Discussed risks and benefits of procedure. Warned about infection bleeding damage to structures skin hypopigmentation and fat atrophy among others. Patient expresses understanding and agreement Time-out conducted.   Noted no overlying erythema, induration, or other signs of local infection.   Skin prepped in a sterile fashion.   Local anesthesia: Topical Ethyl chloride.   With sterile technique and under real time ultrasound guidance: 40 mg of Kenalog and 2 mL of Marcaine injected into hip joint. Fluid seen entering the joint  capsule.   Completed without difficulty   Pain immediately resolved suggesting accurate placement of the medication.   Advised to call if fevers/chills, erythema, induration, drainage, or persistent bleeding.   Images permanently stored and available for review in the ultrasound unit.  Impression: Technically successful ultrasound guided injection.   X-ray images left hip obtained today personally and independently interpreted No severe degenerative changes.  No AVN.  Acute fractures. Await formal radiology review   EXAM: MR OF THE LEFT HIP WITHOUT CONTRAST   TECHNIQUE: Multiplanar, multisequence MR imaging was performed. No intravenous contrast was administered.   COMPARISON:  None.   FINDINGS: Both hips are normally located. Mild to moderate hip joint degenerative changes bilaterally. No findings for stress fracture or AVN. No hip joint effusion. No periarticular fluid collections to suggest a paralabral cyst.   Left anterior superior labral tear is suspected on the sagittal images.   Mild bilateral peritendinosis, left greater than right and associated mild left trochanteric bursitis. The hip and pelvic musculature are unremarkable. No muscle tear, myositis or mass.   The pubic symphysis and SI joints are intact. No pelvic fractures or bone lesions.   No significant intrapelvic abnormalities are identified. There is a 2.5 cm simple appearing left adnexal cyst noted.   IMPRESSION: 1. Mild to moderate bilateral hip joint degenerative changes bilaterally. No findings for stress fracture or AVN. 2. Anterior superior labral tear suspected on the left. 3.  Mild bilateral peritendinosis, left greater than right and associated mild left trochanteric bursitis.     Electronically Signed   By: Marijo Sanes M.D.   On: 04/15/2020 07:44   I, Natasha Norman, personally (independently) visualized and performed the interpretation of the images attached in this  note.    Assessment and Plan: 70 y.o. female with left anterior hip pain occurring in the setting of prior left trochanteric bursitis.  Unfortunately patient has been unable to have significant improvement with physical therapy.  Trial of diagnostic and therapeutic injection in clinic today provided immediate and fantastic pain relief indicating that her pain is intra-articular and unlikely to be due to hip flexor or other external causes of hip pain.  Hopefully she will have some good response to the injection.  X-ray obtained at the date of service per my read did not show severe arthritis however radiology overread is still pending.   PDMP not reviewed this encounter. Orders Placed This Encounter  Procedures   DG HIP UNILAT WITH PELVIS 2-3 VIEWS LEFT    Standing Status:   Future    Number of Occurrences:   1    Standing Expiration Date:   04/07/2022    Order Specific Question:   Reason for Exam (SYMPTOM  OR DIAGNOSIS REQUIRED)    Answer:   eval left hip pain    Order Specific Question:   Preferred imaging location?    Answer:   [facility]   [place] LIMITED JOINT SPACE STRUCTURES LOW LEFT(NO LINKED CHARGES)    Standing Status:   Future    Number of Occurrences:   1    Standing Expiration Date:   10/08/2021    Order Specific Question:   Reason for Exam (SYMPTOM  OR DIAGNOSIS REQUIRED)    Answer:   left hip pain    Order Specific Question:   Preferred imaging location?    Answer:   [place]   No orders of the defined types were placed in this encounter.    Discussed warning signs or symptoms. Please see discharge instructions. Patient expresses understanding.   The above documentation has been reviewed and is accurate and complete Natasha Norman, M.D.

## 2021-04-07 NOTE — Patient Instructions (Signed)
Thank you for coming in today.   Call or go to the ER if you develop a large red swollen joint with extreme pain or oozing puss.    See how you do for a few hours while the hip is numbed up and how you do.   Please get an Xray today before you leave

## 2021-04-10 NOTE — Progress Notes (Signed)
Left hip x-ray looks normal to radiology.  No severe arthritis changes.  No fractures visible.

## 2021-04-30 ENCOUNTER — Other Ambulatory Visit: Payer: Self-pay | Admitting: Adult Health

## 2021-04-30 DIAGNOSIS — Z1231 Encounter for screening mammogram for malignant neoplasm of breast: Secondary | ICD-10-CM

## 2021-05-01 DIAGNOSIS — Z872 Personal history of diseases of the skin and subcutaneous tissue: Secondary | ICD-10-CM | POA: Diagnosis not present

## 2021-05-01 DIAGNOSIS — D225 Melanocytic nevi of trunk: Secondary | ICD-10-CM | POA: Diagnosis not present

## 2021-05-01 DIAGNOSIS — D485 Neoplasm of uncertain behavior of skin: Secondary | ICD-10-CM | POA: Diagnosis not present

## 2021-05-01 DIAGNOSIS — L249 Irritant contact dermatitis, unspecified cause: Secondary | ICD-10-CM | POA: Diagnosis not present

## 2021-05-01 DIAGNOSIS — D2272 Melanocytic nevi of left lower limb, including hip: Secondary | ICD-10-CM | POA: Diagnosis not present

## 2021-05-01 DIAGNOSIS — D224 Melanocytic nevi of scalp and neck: Secondary | ICD-10-CM | POA: Diagnosis not present

## 2021-05-01 DIAGNOSIS — Z85820 Personal history of malignant melanoma of skin: Secondary | ICD-10-CM | POA: Diagnosis not present

## 2021-05-01 DIAGNOSIS — L905 Scar conditions and fibrosis of skin: Secondary | ICD-10-CM | POA: Diagnosis not present

## 2021-05-01 DIAGNOSIS — L821 Other seborrheic keratosis: Secondary | ICD-10-CM | POA: Diagnosis not present

## 2021-05-01 DIAGNOSIS — L814 Other melanin hyperpigmentation: Secondary | ICD-10-CM | POA: Diagnosis not present

## 2021-05-05 ENCOUNTER — Encounter: Payer: Self-pay | Admitting: Family Medicine

## 2021-05-11 NOTE — Progress Notes (Signed)
I, Wendy Poet, LAT, ATC, am serving as scribe for Dr. [name].  Natasha Norman is a 70 y.o. female who presents to [place] at [facility] today for f/u of L hip pain.  She was last seen by Dr. Georgina Snell on 04/07/21 and had a L hip femoral acetabular joint.  She has completed 5 PT sessions.  Since her last visit, pt reports that the injection helped for a little while but her pain has returned to about where it was previously.  Today, she locates her pain to her L lateral and ant hip and groin radiating distally into her L thigh.  She states that standing up from a seated position is more painful than standing up from a supine position.    Radiating pain: L hip XR- 04/07/21 and 12/14/19; L hip MRI- 04/13/20   Pertinent review of systems: No fevers or chills  Relevant historical information: Hypothyroid   Exam:  BP 120/78 (BP Location: Right Arm, Patient Position: Sitting, Cuff Size: Normal)   Pulse 73   Ht '5\' 5"'$  (1.651 m)   Wt 129 lb 12.8 oz (58.9 kg)   SpO2 98%   BMI 21.60 kg/m  General: Well Developed, well nourished, and in no acute distress.   MSK: Left hip normal gait.    Lab and Radiology Results  EXAM: DG HIP (WITH OR WITHOUT PELVIS) 2-3V LEFT   COMPARISON:  12/14/2019   FINDINGS: There is no evidence of hip fracture or dislocation. There is no evidence of arthropathy or other focal bone abnormality.   IMPRESSION: Negative.     Electronically Signed   By: Fidela Salisbury MD   On: 04/09/2021 22:46    EXAM: MR OF THE LEFT HIP WITHOUT CONTRAST   TECHNIQUE: Multiplanar, multisequence MR imaging was performed. No intravenous contrast was administered.   COMPARISON:  None.   FINDINGS: Both hips are normally located. Mild to moderate hip joint degenerative changes bilaterally. No findings for stress fracture or AVN. No hip joint effusion. No periarticular fluid collections to suggest a paralabral cyst.   Left anterior superior  labral tear is suspected on the sagittal images.   Mild bilateral peritendinosis, left greater than right and associated mild left trochanteric bursitis. The hip and pelvic musculature are unremarkable. No muscle tear, myositis or mass.   The pubic symphysis and SI joints are intact. No pelvic fractures or bone lesions.   No significant intrapelvic abnormalities are identified. There is a 2.5 cm simple appearing left adnexal cyst noted.   IMPRESSION: 1. Mild to moderate bilateral hip joint degenerative changes bilaterally. No findings for stress fracture or AVN. 2. Anterior superior labral tear suspected on the left. 3. Mild bilateral peritendinosis, left greater than right and associated mild left trochanteric bursitis.     Electronically Signed   By: Marijo Sanes M.D.   On: 04/15/2020 07:44  I, [name], personally (independently) visualized and performed the interpretation of the images attached in this note.   Assessment and Plan: 70 y.o. female with left anterior and lateral hip pain.  This is a chronic ongoing issue that has not responded sufficiently well to chemical conservative management strategies.  She is thought to have trochanteric bursitis and hip abductor tendinopathy as well as anterior hip pain secondary to hip impingement and knee and suspected labrum tear.  She had a fantastic but very short acting response to intra-articular hip injection in clinic last month indicating that a lot of her pain is  due to intra-articular hip pathology such as labrum tear.  Unfortunately subsequent follow-up x-ray did not show severe arthritis.  After discussion with patient plan to proceed to MRI arthrogram of the left hip.  This should help determine cause of pain including potential labrum tear suspected on original noncontrast hip MRI in July 2021.  This should also help determine potential surgical options.  Surgery is very likely indicated as she is failing conservative  management strategies.  Anticipate referral to orthopedic surgery for surgical and second opinion after MRI arthrogram results are back.   PDMP not reviewed this encounter. Orders Placed This Encounter  Procedures   MR HIP LEFT W CONTRAST    MRI arthrogram. No IV contrast. Schedule with Dr T for arthrogram injection 1 hour prior to MRI.    Standing Status:   Future    Standing Expiration Date:   05/12/2022    Scheduling Instructions:     MRI arthrogram. No IV contrast. Schedule with Dr T for arthrogram injection 1 hour prior to MRI.    Order Specific Question:   If indicated for the ordered procedure, I authorize the administration of contrast media per Radiology protocol    Answer:   Yes    Order Specific Question:   What is the patient's sedation requirement?    Answer:   No Sedation    Order Specific Question:   Does the patient have a pacemaker or implanted devices?    Answer:   No    Order Specific Question:   Preferred imaging location?    Answer:   [facility] (table limit-350lbs)   No orders of the defined types were placed in this encounter.    Discussed warning signs or symptoms. Please see discharge instructions. Patient expresses understanding.   The above documentation has been reviewed and is accurate and complete [name], M.D.  Total encounter time 20 minutes including face-to-face time with the patient and, reviewing past medical record, and charting on the date of service.   Treatment plan and options

## 2021-05-12 ENCOUNTER — Other Ambulatory Visit: Payer: Self-pay

## 2021-05-12 ENCOUNTER — Encounter: Payer: Self-pay | Admitting: Family Medicine

## 2021-05-12 ENCOUNTER — Ambulatory Visit (INDEPENDENT_AMBULATORY_CARE_PROVIDER_SITE_OTHER): Payer: Medicare Other | Admitting: Family Medicine

## 2021-05-12 VITALS — BP 120/78 | HR 73 | Ht 65.0 in | Wt 129.8 lb

## 2021-05-12 DIAGNOSIS — M7062 Trochanteric bursitis, left hip: Secondary | ICD-10-CM

## 2021-05-12 DIAGNOSIS — M25552 Pain in left hip: Secondary | ICD-10-CM

## 2021-05-12 DIAGNOSIS — S73192D Other sprain of left hip, subsequent encounter: Secondary | ICD-10-CM | POA: Diagnosis not present

## 2021-05-12 NOTE — Patient Instructions (Signed)
Thank you for coming in today.   You should hear from MRI scheduling within 1 week. If you do not hear please let me know.    Let me know if you have a problem.   Anticipate surgical consultation after the MRI for surgical discussion and second opinion . Marland Kitchen

## 2021-05-25 ENCOUNTER — Ambulatory Visit (INDEPENDENT_AMBULATORY_CARE_PROVIDER_SITE_OTHER): Payer: Medicare Other | Admitting: Sports Medicine

## 2021-05-25 ENCOUNTER — Ambulatory Visit (INDEPENDENT_AMBULATORY_CARE_PROVIDER_SITE_OTHER): Payer: Medicare Other

## 2021-05-25 ENCOUNTER — Other Ambulatory Visit: Payer: Self-pay

## 2021-05-25 DIAGNOSIS — M25552 Pain in left hip: Secondary | ICD-10-CM

## 2021-05-25 DIAGNOSIS — M7062 Trochanteric bursitis, left hip: Secondary | ICD-10-CM

## 2021-05-25 DIAGNOSIS — M1612 Unilateral primary osteoarthritis, left hip: Secondary | ICD-10-CM | POA: Diagnosis not present

## 2021-05-25 DIAGNOSIS — S73192A Other sprain of left hip, initial encounter: Secondary | ICD-10-CM | POA: Diagnosis not present

## 2021-05-25 DIAGNOSIS — M24152 Other articular cartilage disorders, left hip: Secondary | ICD-10-CM | POA: Diagnosis not present

## 2021-05-25 DIAGNOSIS — S73192D Other sprain of left hip, subsequent encounter: Secondary | ICD-10-CM | POA: Diagnosis not present

## 2021-05-25 IMAGING — MR MR HIP*L* W/CM
5 series · 29 of 40 positions shown · IV contrast (agent unspecified)
Comparison: X-ray [DATE], MRI [DATE]

CLINICAL DATA: Hip pain, chronic, impingement suspected, xray done

EXAM:
MRI OF THE LEFT HIP WITH CONTRAST (MR Arthrogram)
TECHNIQUE: Multiplanar, multisequence MR imaging of the hip was performed
immediately following contrast injection into the hip joint under
fluoroscopic guidance. No intravenous contrast was administered.

[Series 2: STIR · coronal · 4.0mm · 0.68mm/px · 1 of 40 slices shown]
[im 1/40]
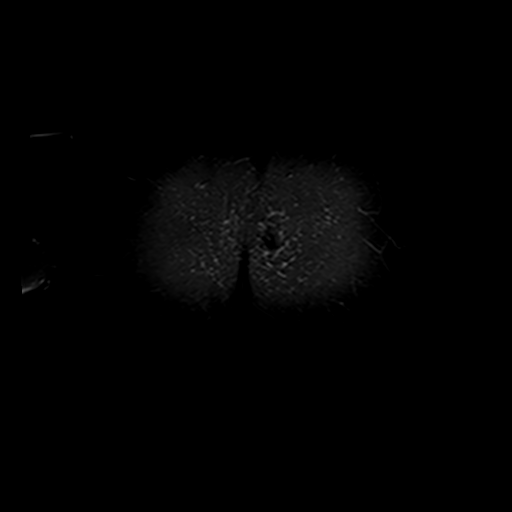

[Series 4: T2 fat-sat · axial · 4.0mm · 1.37mm/px · z∈[-83,+69]mm · 9 of 35 slices shown]
[im 1/35]
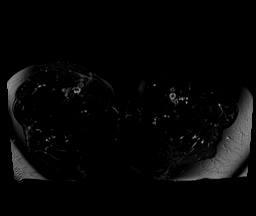
[im 5/35]
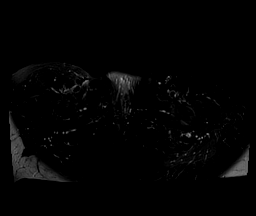
[im 9/35]
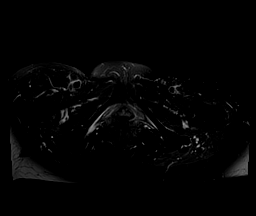
[im 13/35]
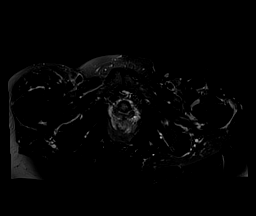
[im 18/35]
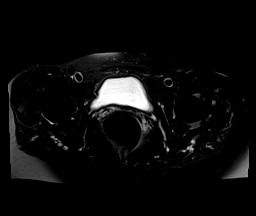
[im 22/35]
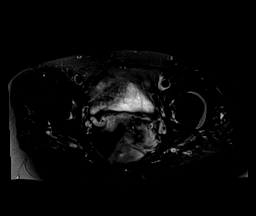
[im 26/35]
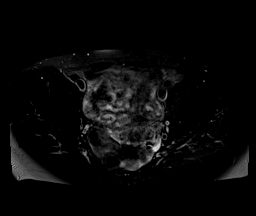
[im 30/35]
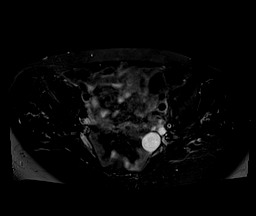
[im 35/35]
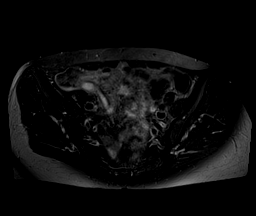

[Series 6: T1 fat-sat · axial · 4.0mm · 0.70mm/px · z∈[-28,+60]mm · 6 of 23 slices shown (1 of 3)]
[im 1/23]
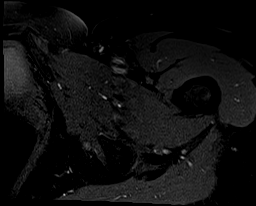
[im 5/23]
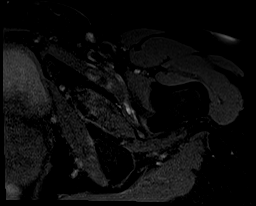
[im 9/23]
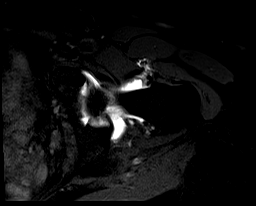
[im 14/23]
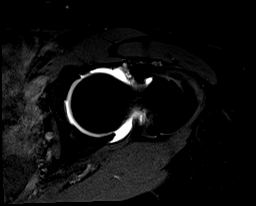
[im 18/23]
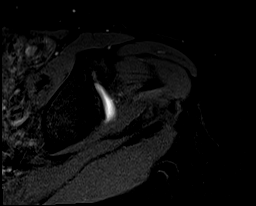
[im 23/23]
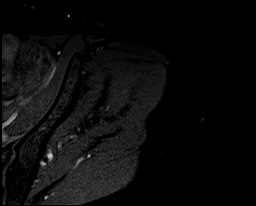

[Series 7: T1 fat-sat · coronal · 4.0mm · 0.35mm/px · 6 of 23 slices shown (2 of 3)]
[im 1/23]
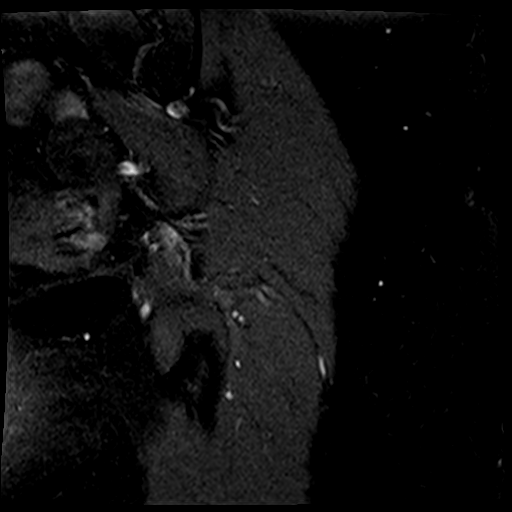
[im 5/23]
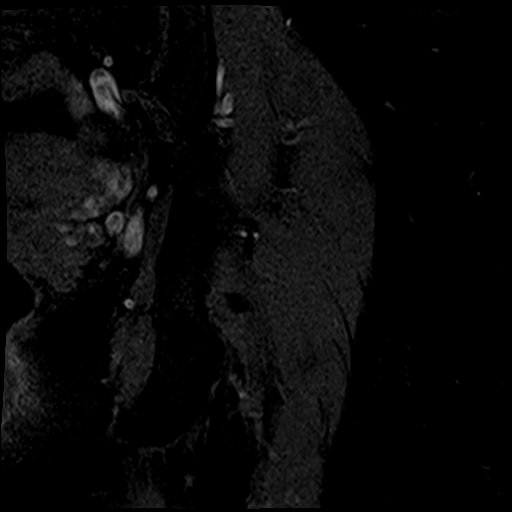
[im 9/23]
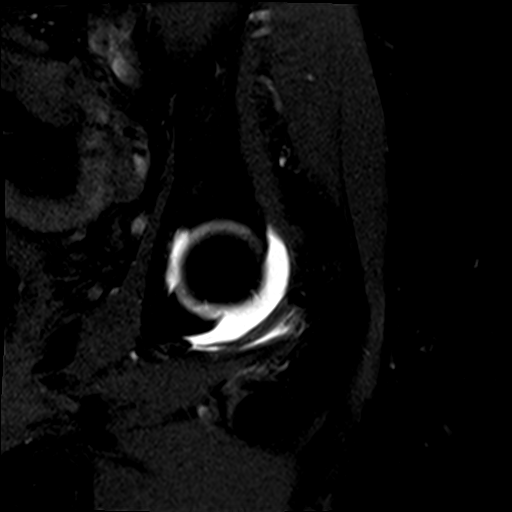
[im 14/23]
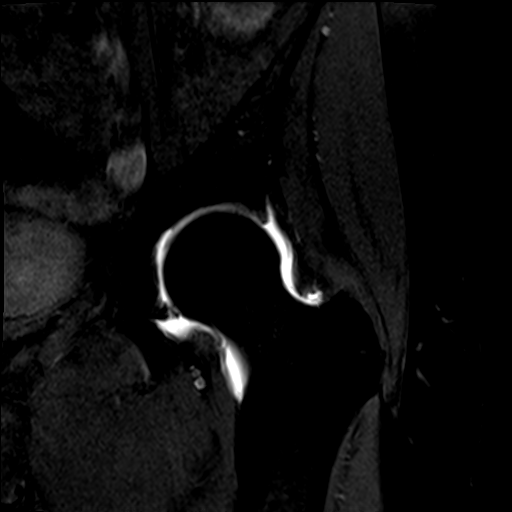
[im 18/23]
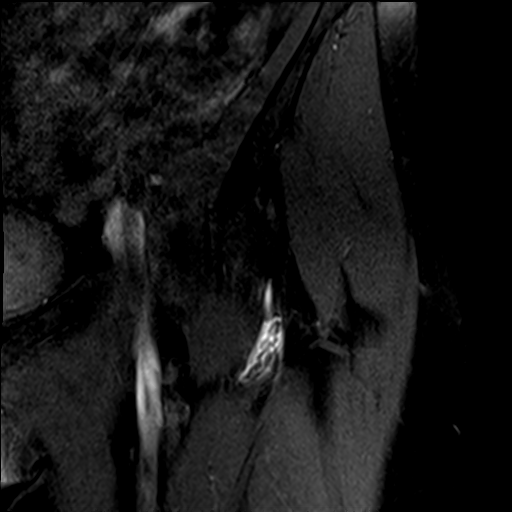
[im 23/23]
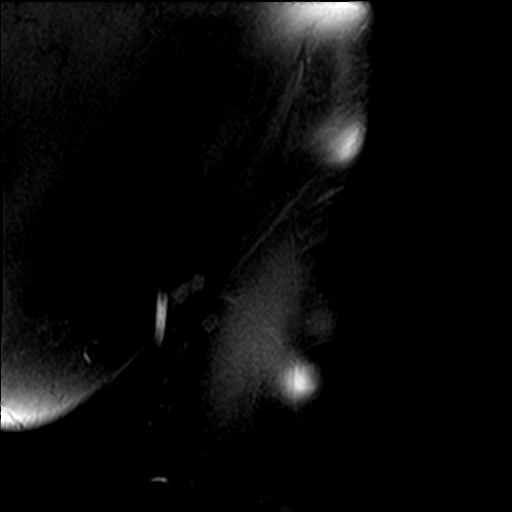

[Series 8: T1 fat-sat · sagittal · 4.0mm · 0.35mm/px · 7 of 27 slices shown (3 of 3)]
[im 1/27]
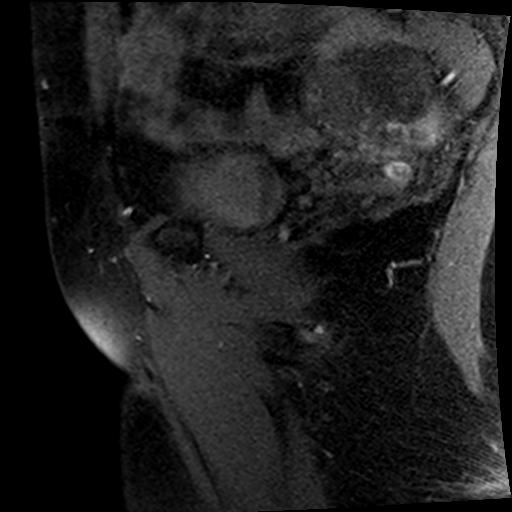
[im 5/27]
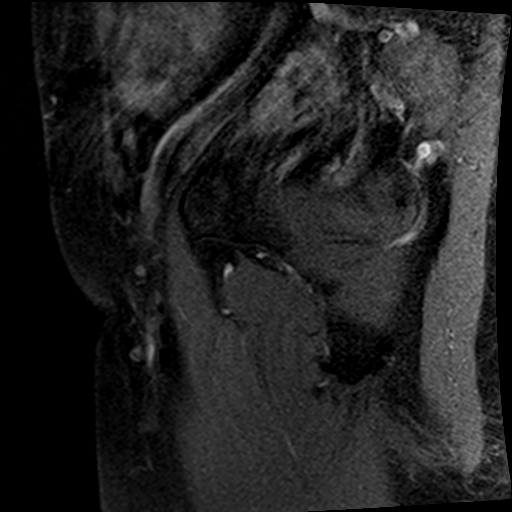
[im 9/27]
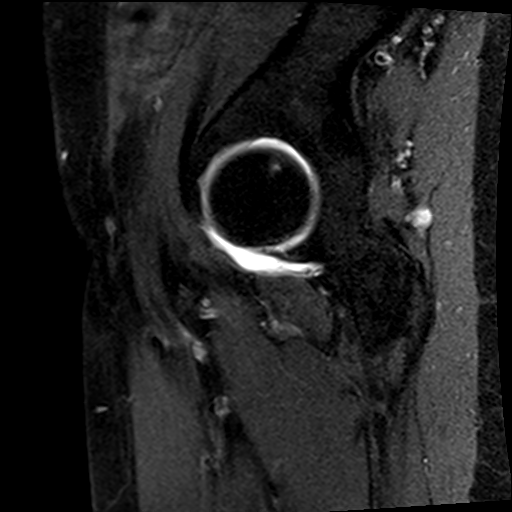
[im 14/27]
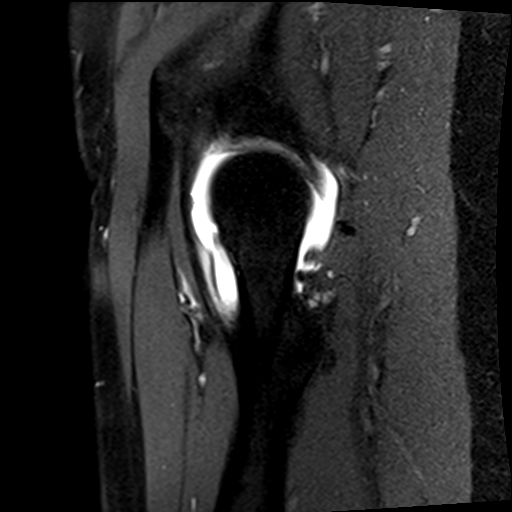
[im 18/27]
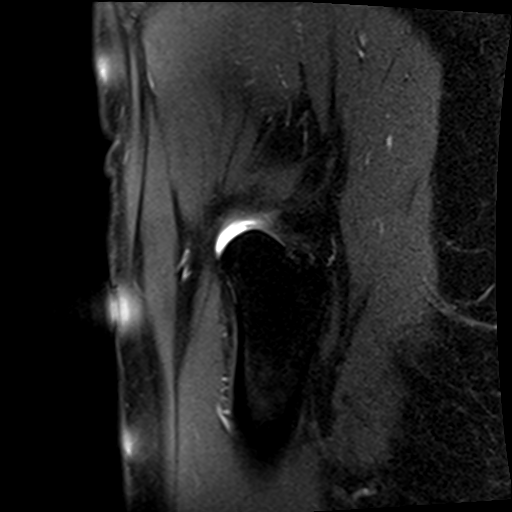
[im 22/27]
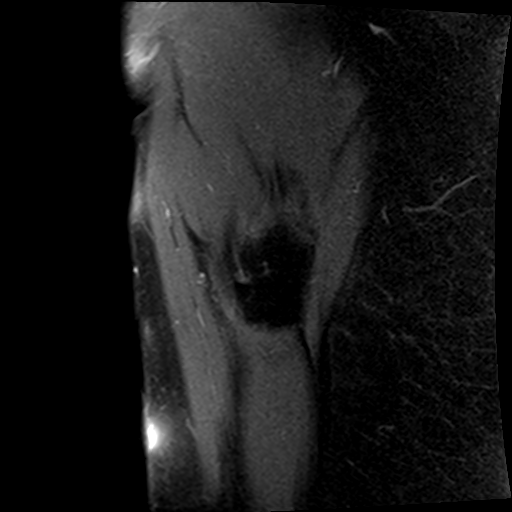
[im 27/27]
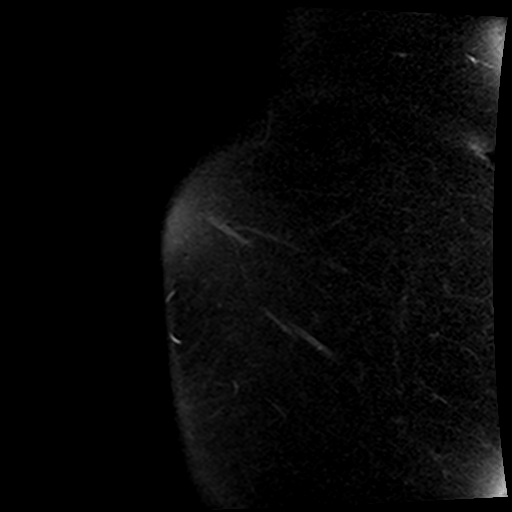

[29 of 40 positions shown; findings below may reference images not displayed]

FINDINGS: Bones: No acute fracture. No dislocation. No femoral head avascular
necrosis. Bony pelvis intact without diastasis. Mild arthropathy of
the SI joints and pubic symphysis. No bone marrow edema. No marrow
replacing bone lesion.

Articular cartilage and labrum

Articular cartilage:  Mild chondral thinning without focal defect.

Labrum: Small nondisplaced anterosuperior labral tear (series 8,
image 12). No paralabral cyst.

Joint or bursal effusion

Joint effusion: Well distended with injected contrast. A small
amount of extracapsular contrast extends into the anterior soft
tissues and is likely related to injection.

Bursae: Left peritrochanteric bursal edema.

Muscles and tendons

Muscles and tendons: Tendinosis and low-grade insertional tearing of
the gluteus medius tendon. Moderate tendinosis of the distal gluteus
minimus tendon. Mild tendinosis of the left hamstring tendon origin.
Remaining tendinous structures about the left hip are intact. Normal
muscle bulk and signal intensity without edema, atrophy, or fatty
infiltration.

Other findings

Miscellaneous: No inguinal lymphadenopathy. Stable 2.5 cm simple
appearing cyst is again seen within the left adnexa. No follow-up
imaging recommended. Note: This recommendation does not apply to
premenarchal patients and to those with increased risk (genetic,
family history, elevated tumor markers or other high-risk factors)
of ovarian cancer. Reference: JACR [DATE]):[id number]
IMPRESSION: 1. Mild left hip osteoarthritis. Small nondisplaced anterosuperior
labral tear.
2. Tendinosis and low-grade insertional tearing of the gluteus
medius tendon. Moderate tendinosis of the distal gluteus minimus
tendon. Mild left peritrochanteric bursitis
3. Mild tendinosis of the left hamstring tendon origin.

## 2021-05-25 MED ORDER — GADOBUTROL 1 MMOL/ML IV SOLN
1.0000 mL | Freq: Once | INTRAVENOUS | Status: AC | PRN
  Administered 2021-05-25: 1 mL via INTRAVENOUS

## 2021-05-25 NOTE — Assessment & Plan Note (Signed)
This is a very pleasant 70 year old female with chronic left hip pain, she does show the C sign when describing her pain, she has gelling. She is had several injections including trochanteric bursa and hip joint. Only few weeks of relief at a time. Has had physical therapy, x-rays. Today we performed the arthrogram injection. Further management per primary treating provider.

## 2021-05-25 NOTE — Progress Notes (Signed)
    Procedures performed today:    Procedure: Real-time Ultrasound Guided gadolinium contrast injection of left femorocetabular joint Device: Samsung HS60  Verbal informed consent obtained.  Time-out conducted.  Noted no overlying erythema, induration, or other signs of local infection.  Skin prepped in a sterile fashion.  Local anesthesia: Topical Ethyl chloride.  With sterile technique and under real time ultrasound guidance: I advanced a 22-gauge spinal needle to the femoral head/neck junction, contacted bone and injected 1 cc kenalog 40, 2 cc lidocaine, 2 cc bupivacaine, syringe switched and 0.1 cc gadolinium injected, syringe again switched and 10 cc sterile saline used to distend the joint. There was a bit of synovitis of the joint capsule visible. Joint visualized and capsule seen distending confirming intra-articular placement of contrast material and medication. Completed without difficulty  Advised to call if fevers/chills, erythema, induration, drainage, or persistent bleeding.  Images permanently stored in PACS Impression: Technically successful ultrasound guided gadolinium contrast injection for MR arthrography.  Please see separate MR arthrogram report.  Independent interpretation of notes and tests performed by another provider:   None.  Brief History, Exam, Impression, and Recommendations:    Trochanteric bursitis, left hip This is a very pleasant 70 year old female with chronic left hip pain, she does show the C sign when describing her pain, she has gelling. She is had several injections including trochanteric bursa and hip joint. Only few weeks of relief at a time. Has had physical therapy, x-rays. Today we performed the arthrogram injection. Further management per primary treating provider.    ___________________________________________ Gwen Her. Dianah Field, M.D., ABFM., CAQSM. Primary Care and [place]  Instructor of [place] of [facility] of Medicine

## 2021-05-26 NOTE — Progress Notes (Signed)
MRI left hip shows mild hip arthritis and a small labrum tear.  This could be causing the pain that you are experiencing in the groin. You additionally have tendinitis and a mild tear of the gluteus medius tendon and medium tendinitis of the gluteus minimus tendon and mild trochanteric bursitis. Additionally has some hamstring tendinitis. All this together could cause the pain that you are experiencing. Recommend return to clinic to go over the results of full detail.

## 2021-05-27 NOTE — Progress Notes (Signed)
I, Wendy Poet, LAT, ATC, am serving as scribe for Dr. [name].  Natasha Norman is a 70 y.o. female who presents to [facility] [facility] today for f/u of L hip pain ( L ant and lat hip and groin radiating into her R thigh).  She was last seen by Dr. Georgina Snell on 05/12/21 and noted that her pain had returned to where it was prior to her L hip joint injection on 04/07/21.  She completed a course of PT.  She was referred for a L hip MRI and is here today to review those results.  She states that she is feeling somewhat better today after having her MRI arthrogram w/ the associated hip joint injection.  Pain located mildly in the anterior hip moderately lateral hip and mildly posterior hip.  Diagnostic testing: L hip MRI- 05/25/21; L hip XR- 04/07/21  Pertinent review of systems: No fevers or chills  Relevant historical information: Hypothyroidism   Exam:  BP 112/78 (BP Location: Right Arm, Patient Position: Sitting, Cuff Size: Normal)   Pulse 75   Ht '5\' 5"'$  (1.651 m)   Wt 127 lb 9.6 oz (57.9 kg)   SpO2 98%   BMI 21.23 kg/m  General: Well Developed, well nourished, and in no acute distress.   MSK: Normal gait    Lab and Radiology Results No results found for this or any previous visit (from the past 72 hour(s)). MR HIP LEFT W CONTRAST  Result Date: 05/25/2021 CLINICAL DATA:  Hip pain, chronic, impingement suspected, xray done EXAM: MRI OF THE LEFT HIP WITH CONTRAST (MR Arthrogram) TECHNIQUE: Multiplanar, multisequence MR imaging of the hip was performed immediately following contrast injection into the hip joint under fluoroscopic guidance. No intravenous contrast was administered. COMPARISON:  X-ray 04/07/2021, MRI 04/13/2020 FINDINGS: Bones: No acute fracture. No dislocation. No femoral head avascular necrosis. Bony pelvis intact without diastasis. Mild arthropathy of the SI joints and pubic symphysis. No bone marrow edema. No marrow replacing bone lesion.  Articular cartilage and labrum Articular cartilage:  Mild chondral thinning without focal defect. Labrum: Small nondisplaced anterosuperior labral tear (series 8, image 12). No paralabral cyst. Joint or bursal effusion Joint effusion: Well distended with injected contrast. A small amount of extracapsular contrast extends into the anterior soft tissues and is likely related to injection. Bursae: Left peritrochanteric bursal edema. Muscles and tendons Muscles and tendons: Tendinosis and low-grade insertional tearing of the gluteus medius tendon. Moderate tendinosis of the distal gluteus minimus tendon. Mild tendinosis of the left hamstring tendon origin. Remaining tendinous structures about the left hip are intact. Normal muscle bulk and signal intensity without edema, atrophy, or fatty infiltration. Other findings Miscellaneous: No inguinal lymphadenopathy. Stable 2.5 cm simple appearing cyst is again seen within the left adnexa. No follow-up imaging recommended. Note: This recommendation does not apply to premenarchal patients and to those with increased risk (genetic, family history, elevated tumor markers or other high-risk factors) of ovarian cancer. Reference: JACR 2020 Feb; 17(2):248-254 IMPRESSION: 1. Mild left hip osteoarthritis. Small nondisplaced anterosuperior labral tear. 2. Tendinosis and low-grade insertional tearing of the gluteus medius tendon. Moderate tendinosis of the distal gluteus minimus tendon. Mild left peritrochanteric bursitis 3. Mild tendinosis of the left hamstring tendon origin. Electronically Signed   By: Davina Poke D.O.   On: 05/25/2021 16:27   [place] LIMITED JOINT SPACE STRUCTURES LOW LEFT  Result Date: 05/25/2021 Procedure: Real-time Ultrasound Guided gadolinium contrast injection of left femorocetabular joint Device: Samsung [id number] Verbal  informed consent obtained. Time-out conducted. Noted no overlying erythema, induration, or other signs of local infection. Skin prepped in a  sterile fashion. Local anesthesia: Topical Ethyl chloride. With sterile technique and under real time ultrasound guidance: I advanced a 22-gauge spinal needle to the femoral head/neck junction, contacted bone and injected 1 cc kenalog 40, 2 cc lidocaine, 2 cc bupivacaine, syringe switched and 0.1 cc gadolinium injected, syringe again switched and 10 cc sterile saline used to distend the joint. There was a bit of synovitis of the joint capsule visible. Joint visualized and capsule seen distending confirming intra-articular placement of contrast material and medication. Completed without difficulty Advised to call if fevers/chills, erythema, induration, drainage, or persistent bleeding. Images permanently stored in PACS Impression: Technically successful ultrasound guided gadolinium contrast injection for MR arthrography.  Please see separate MR arthrogram report.   I, [name], personally (independently) visualized and performed the interpretation of the MRI the images attached in this note.    Assessment and Plan: 70 y.o. female with multifactorial hip pain.  Patient has 3 different causes of hip pain.  1) anterior hip pain caused by the mild hip arthritis and small anterior labrum tear.  This is going to be challenging to get completely better but fortunately for now seems to be a more milder issue.  Avoid excessive hip flexion especially with physical therapy treatment as below.  2) lateral hip pain caused by the hip abductor tendinopathy small tear and bursitis.  Only the most bothersome cause of pain.  Has had temporary relief with injection and been hard to get completely better with physical therapy.  I think with a more comprehensive approach with PT she should get better.  Especially with the understanding of the other 2 issues learned with the MRI today.  Plan refer back to PT.  If not better consider PRP in the future.  3) hamstring tendinitis.  New issue identified today MRI.  Add to PT  exercises and treatment.  Recheck in about 8 weeks following PT.   PDMP not reviewed this encounter. Orders Placed This Encounter  Procedures   Ambulatory referral to Physical Therapy    Referral Priority:   Routine    Referral Type:   Physical Medicine    Referral Reason:   Specialty Services Required    Requested Specialty:   Physical Therapy    Number of Visits Requested:   1   No orders of the defined types were placed in this encounter.    Discussed warning signs or symptoms. Please see discharge instructions. Patient expresses understanding.   The above documentation has been reviewed and is accurate and complete [name], M.D.   Discussed treatment plan and options Total encounter time 20 minutes including face-to-face time with the patient and, reviewing past medical record, and charting on the date of service.

## 2021-05-28 ENCOUNTER — Encounter: Payer: Self-pay | Admitting: Family Medicine

## 2021-05-28 ENCOUNTER — Ambulatory Visit (INDEPENDENT_AMBULATORY_CARE_PROVIDER_SITE_OTHER): Payer: Medicare Other | Admitting: Family Medicine

## 2021-05-28 ENCOUNTER — Other Ambulatory Visit: Payer: Self-pay

## 2021-05-28 VITALS — BP 112/78 | HR 75 | Ht 65.0 in | Wt 127.6 lb

## 2021-05-28 DIAGNOSIS — S73192D Other sprain of left hip, subsequent encounter: Secondary | ICD-10-CM

## 2021-05-28 DIAGNOSIS — M76899 Other specified enthesopathies of unspecified lower limb, excluding foot: Secondary | ICD-10-CM | POA: Diagnosis not present

## 2021-05-28 DIAGNOSIS — M25552 Pain in left hip: Secondary | ICD-10-CM | POA: Diagnosis not present

## 2021-05-28 DIAGNOSIS — M7062 Trochanteric bursitis, left hip: Secondary | ICD-10-CM | POA: Diagnosis not present

## 2021-05-28 NOTE — Patient Instructions (Signed)
Thank you for coming in today.   I've referred you to Physical Therapy.  Let us know if you don't hear from them in one week.   Lets try PT again.   Recheck after the PT ends.   I would consider PRP as an option in the future.   If you would like a second opinion I can arrange one with an orthopedics or one of my other sports medicine colleges within the network or out of the network.

## 2021-05-30 ENCOUNTER — Other Ambulatory Visit: Payer: Medicare Other

## 2021-06-16 ENCOUNTER — Ambulatory Visit (INDEPENDENT_AMBULATORY_CARE_PROVIDER_SITE_OTHER): Payer: Medicare Other | Admitting: Physical Therapy

## 2021-06-16 ENCOUNTER — Encounter: Payer: Self-pay | Admitting: Physical Therapy

## 2021-06-16 ENCOUNTER — Other Ambulatory Visit: Payer: Self-pay

## 2021-06-16 DIAGNOSIS — M6281 Muscle weakness (generalized): Secondary | ICD-10-CM | POA: Diagnosis not present

## 2021-06-16 DIAGNOSIS — M25552 Pain in left hip: Secondary | ICD-10-CM

## 2021-06-16 NOTE — Therapy (Signed)
[place], [place], [address] Phone: [phone]   Fax:  [phone]  Physical Therapy Treatment/ Re-Eval   Patient Details  Name: Natasha Norman MRN: [id number] Date of Birth: 09/16/1951 Referring Provider (PT): Dr. Georgina Snell   Encounter Date: 06/16/2021   PT End of Session - 06/16/21 2126     Visit Number 6    Number of Visits 16    Date for PT Re-Evaluation 08/11/21    Authorization Type Medicare  (5 visits prior)    PT Start Time 1215    PT Stop Time 9528    PT Time Calculation (min) 40 min    Activity Tolerance Patient tolerated treatment well;Patient limited by pain    Behavior During Therapy [facility] for tasks assessed/performed             Past Medical History:  Diagnosis Date   Cancer ([place]) 1996   Melanoma   Chicken pox    H/O repair of rotator cuff    Right    Hypothyroidism    Osteopenia    Seasonal allergies     Past Surgical History:  Procedure Laterality Date   BREAST BIOPSY Right 1984, 1996   Benign   lymph node resection  1998    There were no vitals filed for this visit.   Subjective Assessment - 06/16/21 2115     Subjective Pt last seen 6/29. She has had new imaging, and had injection in july. New imaging results below. Pt has continued walking for exercise, about 1 mi,  but has not been doing much strengthening, due to ongoing pain and new findings on MRI. She has continued, ongoing pain. She has most pain laterally, but also has pain anteriorly, and into thigh, as well as some in posterior hip/glute. She states most pain with initial standing, walking, better with more activity. She also rides long hours in car to [place] every couple months, which is very bothersome.    Pertinent History Melanoma, lymph node removal on R    Limitations Sitting;Walking;House hold activities    How long can you sit comfortably? --    How long can you stand comfortably? --    How long can  you walk comfortably? --    Diagnostic tests IMPRESSION:  1. Mild left hip osteoarthritis. Small nondisplaced anterosuperior  labral tear.  2. Tendinosis and low-grade insertional tearing of the gluteus  medius tendon. Moderate tendinosis of the distal gluteus minimus  tendon. Mild left peritrochanteric bursitis  3. Mild tendinosis of the left hamstring tendon origin.    Patient Stated Goals Decreased pain    Currently in Pain? Yes    Pain Score 6     Pain Location Hip    Pain Orientation Left    Pain Descriptors / Indicators Aching;Sore;Radiating    Pain Type Chronic pain    Pain Onset More than a month ago    Pain Frequency Intermittent    Aggravating Factors  walking, sitting, fig 4 motion.                [facility] PT Assessment - 06/16/21 0001       Assessment   Medical Diagnosis L Trochanteric bursitis    Referring Provider (PT) Dr. Georgina Snell    Prior Therapy L hip pain (same issue)      Precautions   Precautions None      Restrictions   Weight Bearing Restrictions No      Balance Screen  Has the patient fallen in the past 6 months No      [place] residence      Prior Function   Level of Independence Independent      Cognition   Overall Cognitive Status Within Functional Limits for tasks assessed      Posture/Postural Control   Posture/Postural Control No significant limitations      AROM   Overall AROM Comments R hip WNL, L hip limited hip extension and ER, sig pain with fig 4   pain     Strength   Overall Strength Comments 4/5 bilateral hips in flexion, ext, ABD,      Palpation   Palpation comment L gr troch, ITB, Glute med/min, piriformis, HS origin, Soreness into anterior hip and tenderness in thigh.      Special Tests    Special Tests Hip Special Tests    Hip Special Tests  Saralyn Pilar (FABER) Test;Trendelenberg Test;Anterior Hip Impingement Test;Hip Scouring      Saralyn Pilar [facility]) Test   Findings Positive                            [facility] Adult PT Treatment/Exercise - 06/16/21 0001       Knee/Hip Exercises: Supine   Hip Adduction Isometric 15 reps    Bridges 15 reps    Other Supine Knee/Hip Exercises Clam GTB x 15;      Manual Therapy   Soft tissue mobilization DTM/TPR to L glute med, min, piriformis.                     PT Education - 06/16/21 2125     Education Details PT POC, Exam findings, HEP    Person(s) Educated Patient    Methods Explanation;Demonstration;Tactile cues;Verbal cues;Handout    Comprehension Verbalized understanding;Returned demonstration;Verbal cues required;Tactile cues required;Need further instruction              PT Short Term Goals - 06/16/21 2127       PT SHORT TERM GOAL #1   Title Pt will become independent with HEP in order to demonstrate synthesis of PT education.    Time 2    Period Weeks    Status New    Target Date 06/30/21               PT Long Term Goals - 06/16/21 2128       PT LONG TERM GOAL #1   Title Pt will become independent with final HEP in order to demonstrate synthesis of PT education.    Time 8    Period Weeks    Status New    Target Date 08/11/21      PT LONG TERM GOAL #2   Title Pt to demo improved strength of L hip to at least 4+/5, to improve stability and pain with activity.    Time 8    Period Weeks    Status New    Target Date 08/11/21      PT LONG TERM GOAL #3   Title Pt will be able to report ability to walk for >2 miles without pain in order to demonstrate functionl improvement and tolerance to repetitive loading similar to daily exercise activity.    Time 8    Period Weeks    Status New    Target Date 08/11/21      PT LONG TERM GOAL #4   Title  Pt  to report decreased pain in L hip to 0-2/10 with standing, walking, and transfers.    Time 8    Period Weeks    Status New    Target Date 08/11/21                   Plan - 06/16/21 2133     Clinical Impression  Statement Pt presents back to PT with continued and ongoing pain in hip, with new findings on MRI. Pt with multiple issues that are likley causing ongoing pain. She does have much tenderness in lateral hip today with trigger points in glute. She has some tenderness with palaption of proximal HS, but minimal pain with resisted testing. She also has pain in anterior hip and into thigh. She Does have significant, ongling pain in hip complex that is limiting her ability for exercise, walking, and functional activities. . Pt to benefit from PT to improve pain and improve ability for activity. She will benefit from education on HEP for strengthening. We did discusss sleeping positions, wearing shoes in her home for increased cushioning, as well as trying to use stationary bike vs walking every day for exercise. Pt to benefit from skilled PT at this time. She will consider seeing specialist at end of this PT bout, if pain is not significantly improved, due to extended time frame of ongoing pain.    Personal Factors and Comorbidities Age;Time since onset of injury/illness/exacerbation;Comorbidity 1;Comorbidity 2;Past/Current Experience    Examination-Activity Limitations Locomotion Level;Stairs;Transfers;Stand    Examination-Participation Restrictions Community Activity;Yard Work;Shop;Other;Driving    Stability/Clinical Decision Making Evolving/Moderate complexity    Clinical Decision Making Moderate    Rehab Potential Good    PT Frequency 2x / week   1-2x   PT Duration 8 weeks   POC written for 12 weeks. Likely to D/C at 8 weeks   PT Treatment/Interventions ADLs/Self Care Home Management;Cryotherapy;Electrical Stimulation;Iontophoresis 4mg /ml Dexamethasone;Moist Heat;Balance training;Therapeutic exercise;Therapeutic activities;Functional mobility training;Stair training;Gait training;DME Instruction;Ultrasound;Neuromuscular re-education;Patient/family education;Orthotic Fit/Training;Manual techniques;Vasopneumatic  Device;Passive range of motion;Dry needling;Taping;Joint Manipulations;Spinal Manipulations    PT Home Exercise Plan [email or web address]    Consulted and Agree with Plan of Care Patient             Patient will benefit from skilled therapeutic intervention in order to improve the following deficits and impairments:  Abnormal gait, Pain, Impaired flexibility, Difficulty walking, Decreased balance, Decreased activity tolerance, Decreased range of motion, Decreased strength, Hypomobility, Decreased mobility, Increased muscle spasms, Decreased endurance  Visit Diagnosis: Pain in left hip  Muscle weakness (generalized)     Problem List Patient Active Problem List   Diagnosis Date Noted   Trochanteric bursitis, left hip 01/10/2020   Hypothyroid 02/12/2016   Lyndee Hensen, PT, DPT 9:50 PM  06/16/21    [place], [place], [address] Phone: [phone]   Fax:  [phone]  Name: Natasha Norman MRN: [id number] Date of Birth: 1951/06/21

## 2021-06-16 NOTE — Patient Instructions (Signed)
Access Code: [email or web address] URL: [email or web address] Date: 06/16/2021 Prepared by: Lyndee Hensen  Exercises Supine Bridge - 1 x daily - 2 sets - 10 reps Hooklying Clamshell with Resistance - 1 x daily - 2 sets - 10 reps - 3 hold Supine Hip Adduction Isometric with Ball - 1 x daily - 2 sets - 10 reps - 5 hold

## 2021-06-19 ENCOUNTER — Ambulatory Visit
Admission: RE | Admit: 2021-06-19 | Discharge: 2021-06-19 | Disposition: A | Discharge status: Home / Self Care | Payer: Medicare Other | Source: Ambulatory Visit | Attending: Adult Health | Admitting: Adult Health

## 2021-06-19 ENCOUNTER — Other Ambulatory Visit: Payer: Self-pay

## 2021-06-19 DIAGNOSIS — Z1231 Encounter for screening mammogram for malignant neoplasm of breast: Secondary | ICD-10-CM

## 2021-06-19 IMAGING — MG MM DIGITAL SCREENING BILAT W/ TOMO AND CAD
8 series · 9 of 24 positions shown · non-contrast
Comparison: Previous exam(s).

CLINICAL DATA: Screening.

EXAM:
DIGITAL SCREENING BILATERAL MAMMOGRAM WITH TOMOSYNTHESIS AND CAD
TECHNIQUE: Bilateral screening digital craniocaudal and mediolateral oblique
mammograms were obtained. Bilateral screening digital breast
tomosynthesis was performed. The images were evaluated with
computer-aided detection.

[L CC synth-2D]
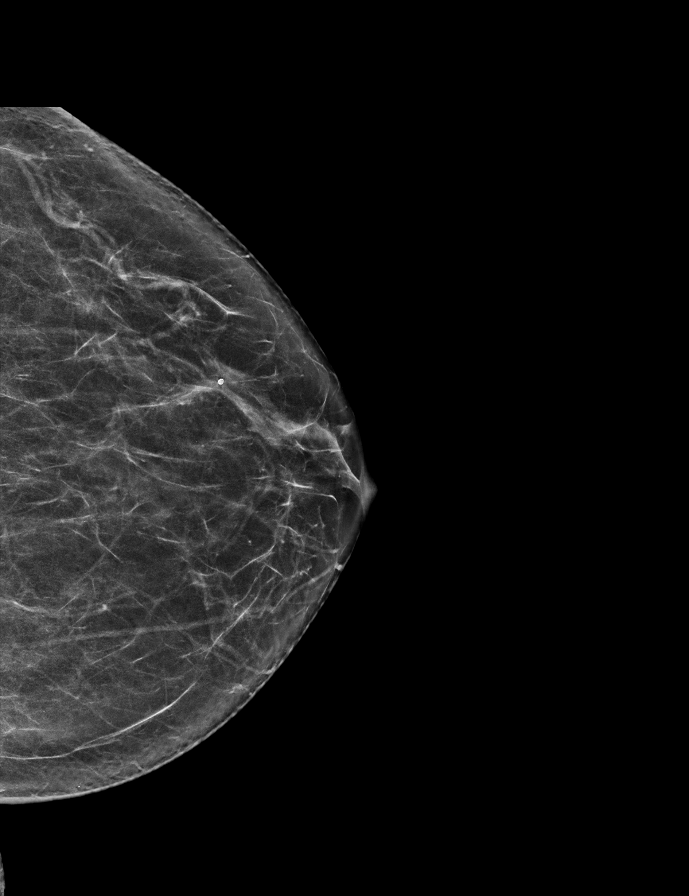

[R MLO synth-2D]
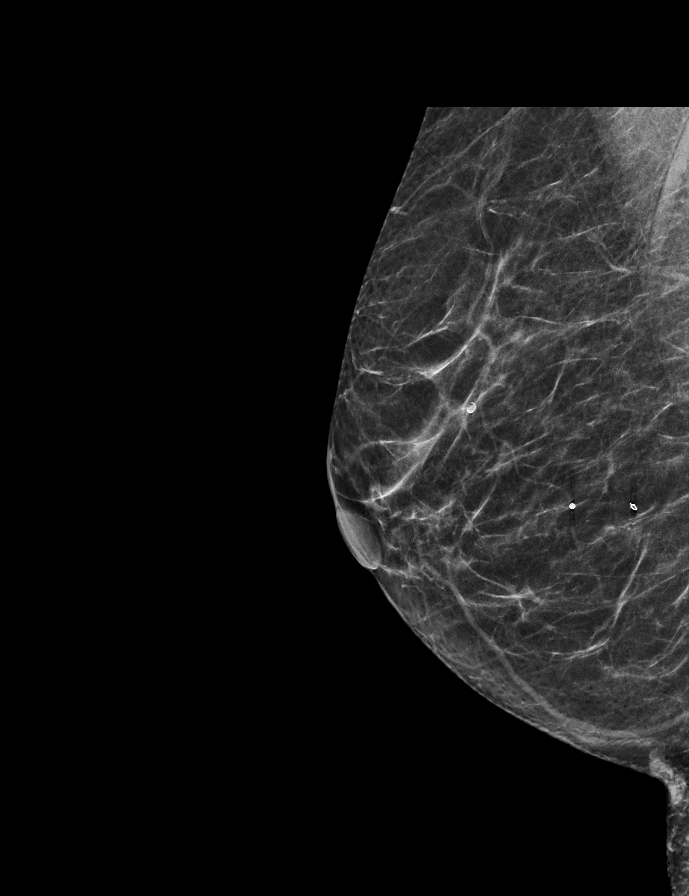

[R CC synth-2D]
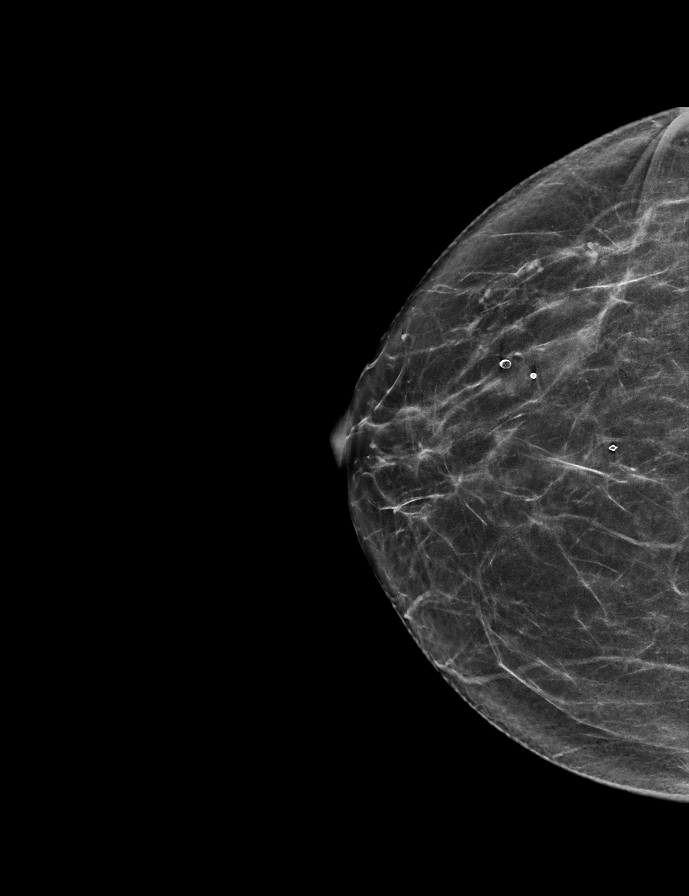

[L MLO synth-2D]
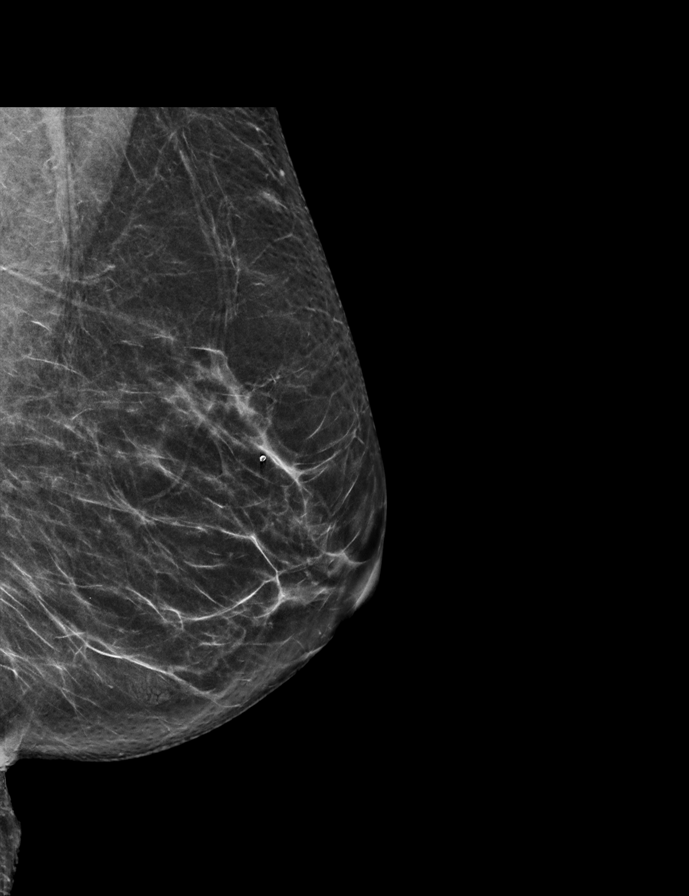

[L MLO tomo · 2 of 62 frames shown]
[frame 21/62]
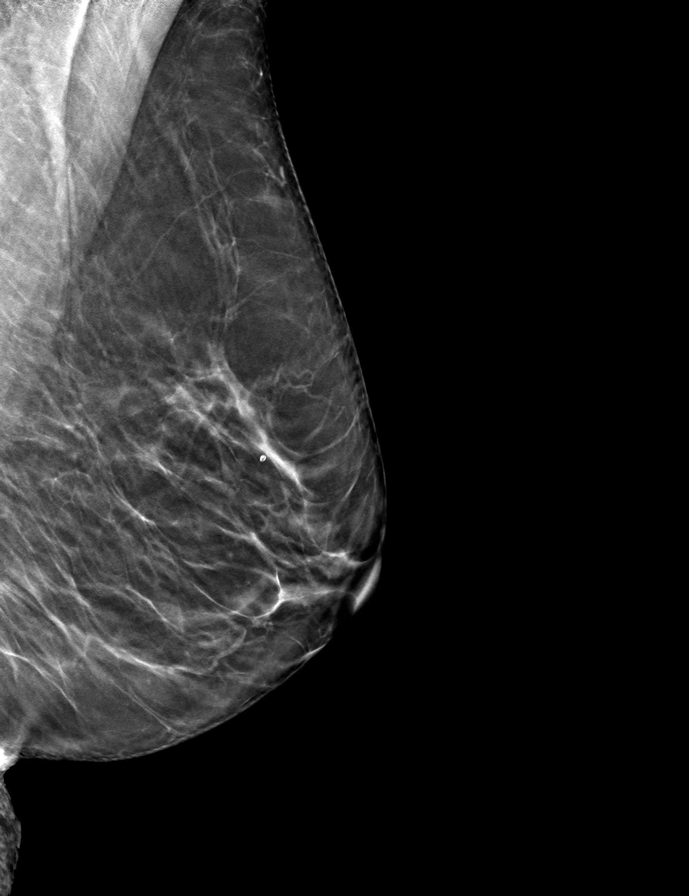
[frame 31/62]
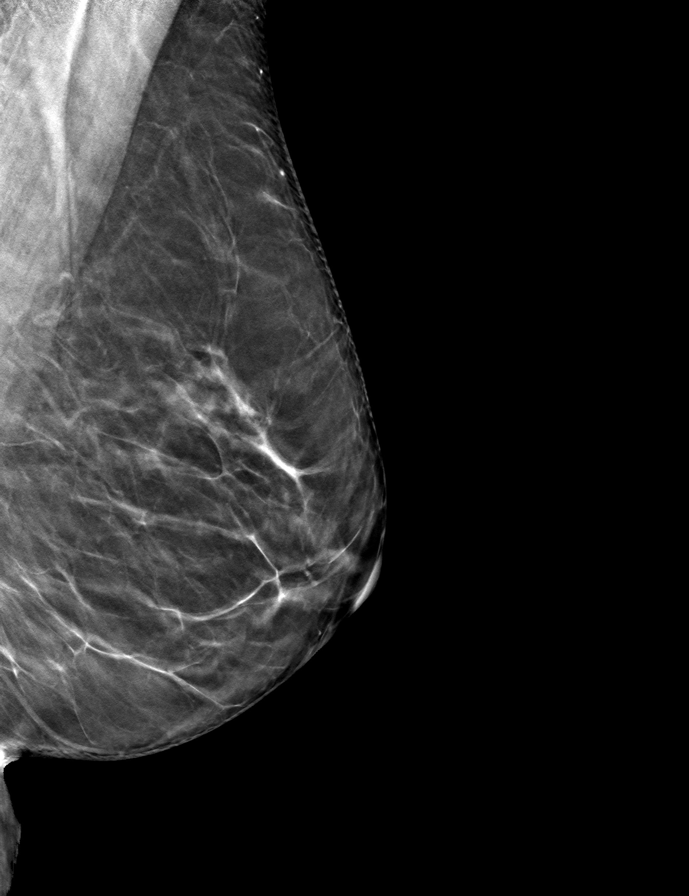

[L CC tomo · tomo slice 31/61.0]
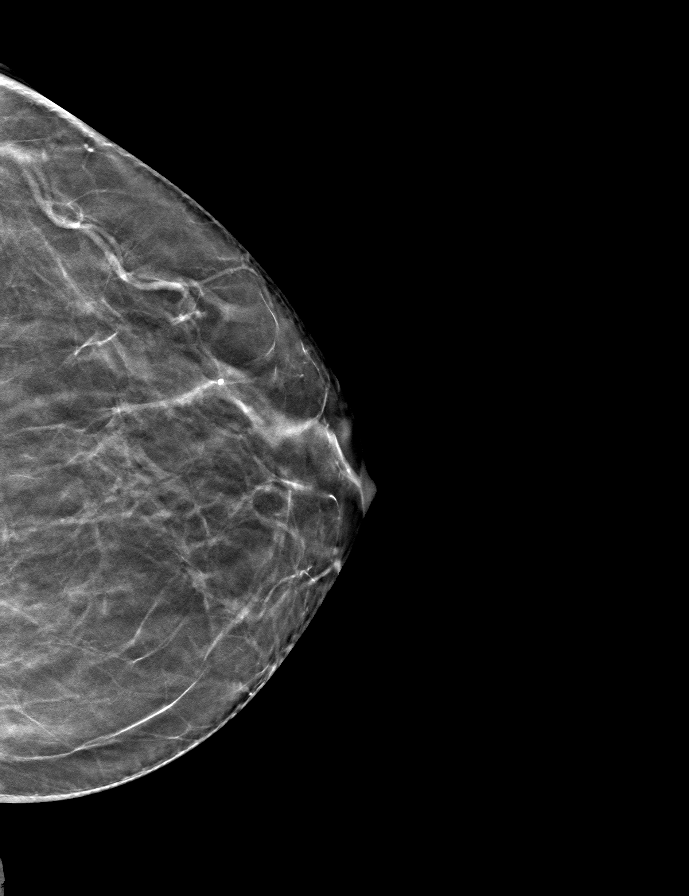

[R MLO tomo · tomo slice 27/53.0]
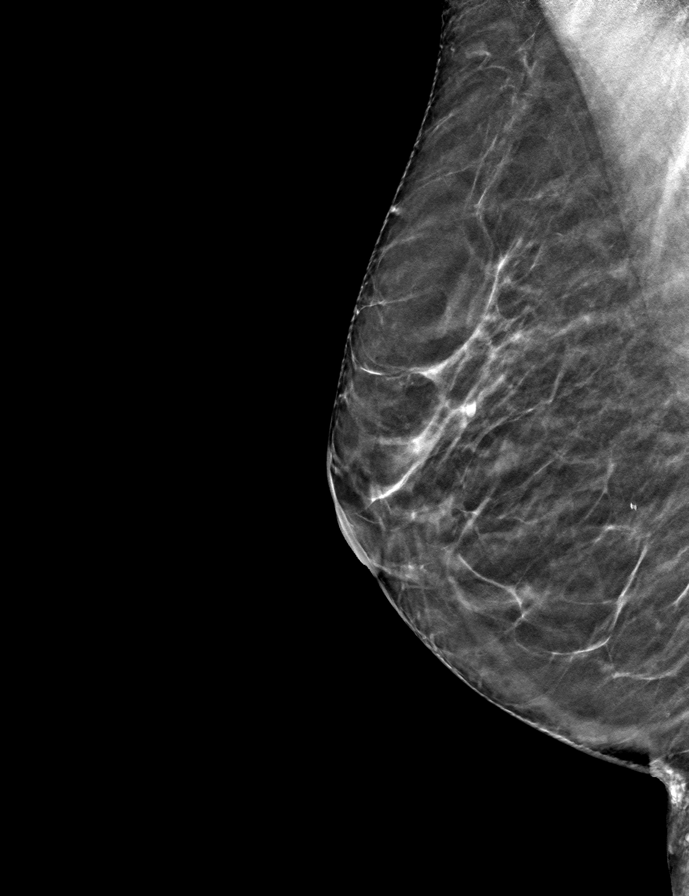

[R CC tomo · tomo slice 31/61.0]
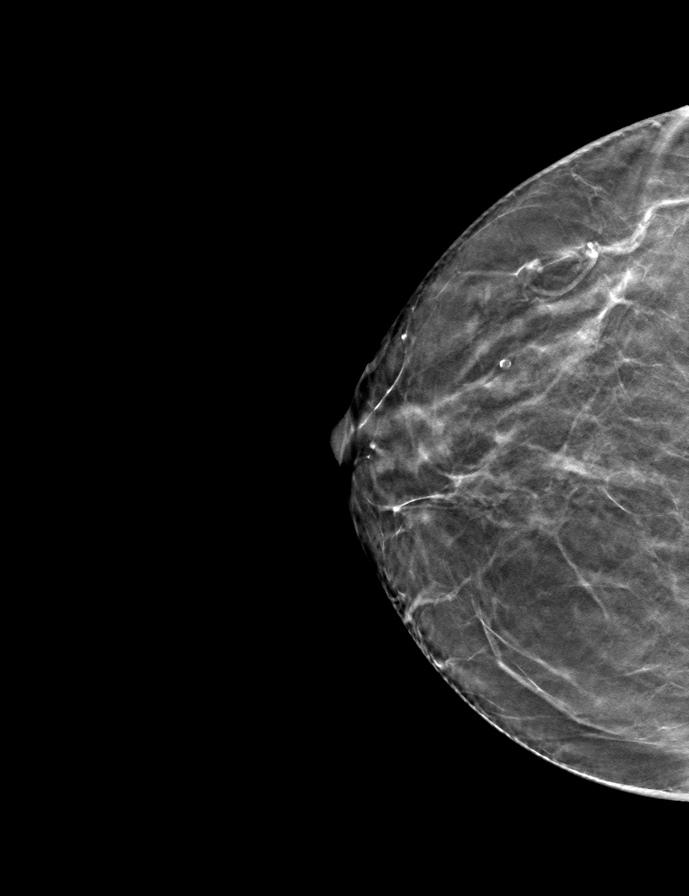

[9 of 24 positions shown; findings below may reference images not displayed]

ACR Breast Density Category b: There are scattered areas of
fibroglandular density.
FINDINGS: There are no findings suspicious for malignancy.
IMPRESSION: No mammographic evidence of malignancy. A result letter of this
screening mammogram will be mailed directly to the patient.

RECOMMENDATION:
Screening mammogram in one year. (Code:[BY])

BI-RADS CATEGORY  1: Negative.

## 2021-07-01 ENCOUNTER — Ambulatory Visit (INDEPENDENT_AMBULATORY_CARE_PROVIDER_SITE_OTHER): Payer: Medicare Other | Admitting: Physical Therapy

## 2021-07-01 ENCOUNTER — Encounter: Payer: Self-pay | Admitting: Physical Therapy

## 2021-07-01 ENCOUNTER — Other Ambulatory Visit: Payer: Self-pay

## 2021-07-01 DIAGNOSIS — Z23 Encounter for immunization: Secondary | ICD-10-CM | POA: Diagnosis not present

## 2021-07-01 DIAGNOSIS — M25552 Pain in left hip: Secondary | ICD-10-CM

## 2021-07-01 DIAGNOSIS — M6281 Muscle weakness (generalized): Secondary | ICD-10-CM | POA: Diagnosis not present

## 2021-07-01 NOTE — Therapy (Signed)
[place] [address] [place], [place], [address] Phone: [phone]   Fax:  [phone]  Physical Therapy Treatment  Patient Details  Name: Natasha Norman MRN: [id number] Date of Birth: 03-01-1951 Referring Provider (PT): Dr. Georgina Snell   Encounter Date: 07/01/2021   PT End of Session - 07/01/21 0827     Visit Number 7    Number of Visits 16    Date for PT Re-Evaluation 08/11/21    Authorization Type Medicare  (5 visits prior)    PT Start Time 0802    PT Stop Time 0845    PT Time Calculation (min) 43 min    Activity Tolerance Patient tolerated treatment well;Patient limited by pain    Behavior During Therapy [facility] for tasks assessed/performed             Past Medical History:  Diagnosis Date   Cancer ([place]) 1996   Melanoma   Chicken pox    H/O repair of rotator cuff    Right    Hypothyroidism    Osteopenia    Seasonal allergies     Past Surgical History:  Procedure Laterality Date   BREAST BIOPSY Right 1984, 1996   Benign   lymph node resection  1998    There were no vitals filed for this visit.   Subjective Assessment - 07/01/21 0826     Subjective Pt states continued soreness, variable. Worse with more sitting, better when shes up moving around.    Currently in Pain? Yes    Pain Score 6     Pain Location Hip    Pain Orientation Left    Pain Descriptors / Indicators Aching    Pain Type Chronic pain    Pain Onset More than a month ago    Pain Frequency Intermittent                               [facility] Adult PT Treatment/Exercise - 07/01/21 0001       Posture/Postural Control   Posture/Postural Control --      Knee/Hip Exercises: Stretches   Active Hamstring Stretch 3 reps;30 seconds    Active Hamstring Stretch Limitations seated    Hip Flexor Stretch 2 reps;30 seconds    Hip Flexor Stretch Limitations kneeling    Piriformis Stretch 2 reps;20 seconds    Piriformis Stretch  Limitations light, fig 4;      Knee/Hip Exercises: Aerobic   Recumbent Bike L1 x 7 min;      Knee/Hip Exercises: Standing   Other Standing Knee Exercises March x 15      Knee/Hip Exercises: Seated   Sit to Sand 10 reps      Knee/Hip Exercises: Supine   Hip Adduction Isometric --    Bridges 15 reps    Other Supine Knee/Hip Exercises Clam GTB x 20;x10 alternating      Manual Therapy   Soft tissue mobilization DTM/TPR to L glute med, min, piriformis.                       PT Short Term Goals - 06/16/21 2127       PT SHORT TERM GOAL #1   Title Pt will become independent with HEP in order to demonstrate synthesis of PT education.    Time 2    Period Weeks    Status New    Target Date 06/30/21  PT Long Term Goals - 06/16/21 2128       PT LONG TERM GOAL #1   Title Pt will become independent with final HEP in order to demonstrate synthesis of PT education.    Time 8    Period Weeks    Status New    Target Date 08/11/21      PT LONG TERM GOAL #2   Title Pt to demo improved strength of L hip to at least 4+/5, to improve stability and pain with activity.    Time 8    Period Weeks    Status New    Target Date 08/11/21      PT LONG TERM GOAL #3   Title Pt will be able to report ability to walk for >2 miles without pain in order to demonstrate functionl improvement and tolerance to repetitive loading similar to daily exercise activity.    Time 8    Period Weeks    Status New    Target Date 08/11/21      PT LONG TERM GOAL #4   Title Pt  to report decreased pain in L hip to 0-2/10 with standing, walking, and transfers.    Time 8    Period Weeks    Status New    Target Date 08/11/21                   Plan - 07/01/21 0907     Clinical Impression Statement Pt with much tenderness in lateral hip and glute with manual today. She has good ability for performing strengthening exercises today, minimal pain with ther ex. Challenged  with hip abd due to weakness. Plan to continue strengthening as tolerated and manual for pain.    Personal Factors and Comorbidities Age;Time since onset of injury/illness/exacerbation;Comorbidity 1;Comorbidity 2;Past/Current Experience    Examination-Activity Limitations Locomotion Level;Stairs;Transfers;Stand    Examination-Participation Restrictions Community Activity;Yard Work;Shop;Other;Driving    Stability/Clinical Decision Making Evolving/Moderate complexity    Rehab Potential Good    PT Frequency 2x / week   1-2x   PT Duration 8 weeks   POC written for 12 weeks. Likely to D/C at 8 weeks   PT Treatment/Interventions ADLs/Self Care Home Management;Cryotherapy;Electrical Stimulation;Iontophoresis 4mg /ml Dexamethasone;Moist Heat;Balance training;Therapeutic exercise;Therapeutic activities;Functional mobility training;Stair training;Gait training;DME Instruction;Ultrasound;Neuromuscular re-education;Patient/family education;Orthotic Fit/Training;Manual techniques;Vasopneumatic Device;Passive range of motion;Dry needling;Taping;Joint Manipulations;Spinal Manipulations    PT Home Exercise Plan [email or web address]    Consulted and Agree with Plan of Care Patient             Patient will benefit from skilled therapeutic intervention in order to improve the following deficits and impairments:  Abnormal gait, Pain, Impaired flexibility, Difficulty walking, Decreased balance, Decreased activity tolerance, Decreased range of motion, Decreased strength, Hypomobility, Decreased mobility, Increased muscle spasms, Decreased endurance  Visit Diagnosis: Pain in left hip  Muscle weakness (generalized)     Problem List Patient Active Problem List   Diagnosis Date Noted   Trochanteric bursitis, left hip 01/10/2020   Hypothyroid 02/12/2016    Lyndee Hensen, PT, DPT 9:11 AM  07/01/21    [facility] [address] [place], [place], [address] Phone:  [phone]   Fax:  [phone]  Name: Natasha Norman MRN: [id number] Date of Birth: 1950-10-29

## 2021-07-03 ENCOUNTER — Encounter: Payer: Self-pay | Admitting: Physical Therapy

## 2021-07-03 ENCOUNTER — Ambulatory Visit (INDEPENDENT_AMBULATORY_CARE_PROVIDER_SITE_OTHER): Payer: Medicare Other | Admitting: Physical Therapy

## 2021-07-03 ENCOUNTER — Other Ambulatory Visit: Payer: Self-pay

## 2021-07-03 DIAGNOSIS — M6281 Muscle weakness (generalized): Secondary | ICD-10-CM | POA: Diagnosis not present

## 2021-07-03 DIAGNOSIS — M25552 Pain in left hip: Secondary | ICD-10-CM | POA: Diagnosis not present

## 2021-07-05 ENCOUNTER — Encounter: Payer: Self-pay | Admitting: Physical Therapy

## 2021-07-05 NOTE — Therapy (Signed)
[facility] [address] [place], [place], [address] Phone: [phone]   Fax:  [phone]  Physical Therapy Treatment  Patient Details  Name: Natasha Norman MRN: [id number] Date of Birth: 01-03-1951 Referring Provider (PT): Dr. Georgina Snell   Encounter Date: 07/03/2021   PT End of Session - 07/05/21 2144     Visit Number 8    Number of Visits 16    Date for PT Re-Evaluation 08/11/21    Authorization Type Medicare  (5 visits prior)    PT Start Time 4010    PT Stop Time 1228    PT Time Calculation (min) 43 min    Activity Tolerance Patient tolerated treatment well;Patient limited by pain    Behavior During Therapy [facility] for tasks assessed/performed             Past Medical History:  Diagnosis Date   Cancer ([place]) 1996   Melanoma   Chicken pox    H/O repair of rotator cuff    Right    Hypothyroidism    Osteopenia    Seasonal allergies     Past Surgical History:  Procedure Laterality Date   BREAST BIOPSY Right 1984, 1996   Benign   lymph node resection  1998    There were no vitals filed for this visit.   Subjective Assessment - 07/05/21 2140     Subjective Pt with no new complaints. Not too sore today bc she has been up moving around a lot this am.    Currently in Pain? Yes    Pain Score 3     Pain Location Hip    Pain Orientation Left    Pain Descriptors / Indicators Aching    Pain Type Chronic pain    Pain Onset More than a month ago    Pain Frequency Intermittent                               [facility] Adult PT Treatment/Exercise - 07/05/21 0001       Knee/Hip Exercises: Stretches   Piriformis Stretch 2 reps;20 seconds    Piriformis Stretch Limitations light, fig 4; supine (tried in sitting: painful)      Knee/Hip Exercises: Aerobic   Recumbent Bike L2 x 7 min;      Knee/Hip Exercises: Standing   Hip Flexion 20 reps;Knee bent    Lateral Step Up 10 reps;Left;Hand Hold: 1;Step  Height: 6"    Forward Step Up 10 reps;Both;Hand Hold: 1;Step Height: 6"    Other Standing Knee Exercises March x 15      Knee/Hip Exercises: Seated   Long Arc Quad 20 reps;Both    Long Arc Quad Limitations 2.5 lb    Sit to [employer] 10 reps      Knee/Hip Exercises: Supine   Bridges 15 reps    Straight Leg Raises 10 reps;Left    Straight Leg Raises Limitations mild soreness    Other Supine Knee/Hip Exercises Clam Blue TB x 10; x10 alternating      Knee/Hip Exercises: Sidelying   Hip ABduction 15 reps;Left      Manual Therapy   Soft tissue mobilization DTM/TPR to L glute med, min, piriformis.                       PT Short Term Goals - 06/16/21 2127       PT SHORT TERM GOAL #1  Title Pt will become independent with HEP in order to demonstrate synthesis of PT education.    Time 2    Period Weeks    Status New    Target Date 06/30/21               PT Long Term Goals - 06/16/21 2128       PT LONG TERM GOAL #1   Title Pt will become independent with final HEP in order to demonstrate synthesis of PT education.    Time 8    Period Weeks    Status New    Target Date 08/11/21      PT LONG TERM GOAL #2   Title Pt to demo improved strength of L hip to at least 4+/5, to improve stability and pain with activity.    Time 8    Period Weeks    Status New    Target Date 08/11/21      PT LONG TERM GOAL #3   Title Pt will be able to report ability to walk for >2 miles without pain in order to demonstrate functionl improvement and tolerance to repetitive loading similar to daily exercise activity.    Time 8    Period Weeks    Status New    Target Date 08/11/21      PT LONG TERM GOAL #4   Title Pt  to report decreased pain in L hip to 0-2/10 with standing, walking, and transfers.    Time 8    Period Weeks    Status New    Target Date 08/11/21                   Plan - 07/05/21 2148     Clinical Impression Statement Pt does have variable pain  depending on activity. She has done well with strengthening in neutral hip positions. She does have increased soreness today with step ups. Plan to progress strength as tolerated.    Personal Factors and Comorbidities Age;Time since onset of injury/illness/exacerbation;Comorbidity 1;Comorbidity 2;Past/Current Experience    Examination-Activity Limitations Locomotion Level;Stairs;Transfers;Stand    Examination-Participation Restrictions Community Activity;Yard Work;Shop;Other;Driving    Stability/Clinical Decision Making Evolving/Moderate complexity    Rehab Potential Good    PT Frequency 2x / week   1-2x   PT Duration 8 weeks   POC written for 12 weeks. Likely to D/C at 8 weeks   PT Treatment/Interventions ADLs/Self Care Home Management;Cryotherapy;Electrical Stimulation;Iontophoresis 4mg /ml Dexamethasone;Moist Heat;Balance training;Therapeutic exercise;Therapeutic activities;Functional mobility training;Stair training;Gait training;DME Instruction;Ultrasound;Neuromuscular re-education;Patient/family education;Orthotic Fit/Training;Manual techniques;Vasopneumatic Device;Passive range of motion;Dry needling;Taping;Joint Manipulations;Spinal Manipulations    PT Home Exercise Plan [id number]    Consulted and Agree with Plan of Care Patient             Patient will benefit from skilled therapeutic intervention in order to improve the following deficits and impairments:  Abnormal gait, Pain, Impaired flexibility, Difficulty walking, Decreased balance, Decreased activity tolerance, Decreased range of motion, Decreased strength, Hypomobility, Decreased mobility, Increased muscle spasms, Decreased endurance  Visit Diagnosis: Pain in left hip  Muscle weakness (generalized)     Problem List Patient Active Problem List   Diagnosis Date Noted   Trochanteric bursitis, left hip 01/10/2020   Hypothyroid 02/12/2016    Lyndee Hensen, PT, DPT 9:50 PM  07/05/21    [facility] [address] [place], [place], [address] Phone: [phone]   Fax:  [phone]  Name: Jenisa Monty MRN: [id number] Date of Birth: July 17, 1951

## 2021-07-07 ENCOUNTER — Other Ambulatory Visit: Payer: Self-pay

## 2021-07-07 ENCOUNTER — Encounter: Payer: Self-pay | Admitting: Physical Therapy

## 2021-07-07 ENCOUNTER — Ambulatory Visit (INDEPENDENT_AMBULATORY_CARE_PROVIDER_SITE_OTHER): Payer: Medicare Other | Admitting: Physical Therapy

## 2021-07-07 DIAGNOSIS — M25552 Pain in left hip: Secondary | ICD-10-CM

## 2021-07-07 DIAGNOSIS — M6281 Muscle weakness (generalized): Secondary | ICD-10-CM | POA: Diagnosis not present

## 2021-07-07 NOTE — Therapy (Signed)
[facility] [address] [place], [place], [address] Phone: [phone]   Fax:  [phone]  Physical Therapy Treatment  Patient Details  Name: Natasha Norman MRN: [id number] Date of Birth: [birth year] Referring Provider (PT): Dr. Georgina Snell   Encounter Date: 07/07/2021   PT End of Session - 07/07/21 0926     Visit Number 9    Number of Visits 16    Date for PT Re-Evaluation 08/11/21    Authorization Type Medicare  (5 visits prior)    PT Start Time 0850    PT Stop Time 0932    PT Time Calculation (min) 42 min    Activity Tolerance Patient tolerated treatment well;Patient limited by pain    Behavior During Therapy [facility] for tasks assessed/performed             Past Medical History:  Diagnosis Date   Cancer ([place]) 1996   Melanoma   Chicken pox    H/O repair of rotator cuff    Right    Hypothyroidism    Osteopenia    Seasonal allergies     Past Surgical History:  Procedure Laterality Date   BREAST BIOPSY Right 1984, 1996   Benign   lymph node resection  1998    There were no vitals filed for this visit.   Subjective Assessment - 07/07/21 2122     Subjective Pt states continued/variable pain.    Patient Stated Goals Decreased pain    Currently in Pain? Yes    Pain Score 3     Pain Orientation Left    Pain Descriptors / Indicators Aching    Pain Type Chronic pain    Pain Onset More than a month ago    Pain Frequency Intermittent                               [facility] Adult PT Treatment/Exercise - 07/07/21 0001       Knee/Hip Exercises: Stretches   Active Hamstring Stretch 3 reps;30 seconds    Piriformis Stretch 2 reps;20 seconds    Piriformis Stretch Limitations supine    Other Knee/Hip Stretches LTR x 10 (for back tightness today)      Knee/Hip Exercises: Aerobic   Recumbent Bike L2 x 8 min;      Knee/Hip Exercises: Standing   Hip Flexion --    Lateral Step Up --    Forward Step Up  10 reps;Both;Hand Hold: 1;Step Height: 6"    Functional Squat 20 reps    Functional Squat Limitations mini squat at mat table    Other Standing Knee Exercises Walk/march fwd, bwd 20 ft x 4;      Knee/Hip Exercises: Seated   Long Arc Quad 20 reps;Both    Long Arc [employer] Limitations 3    Sit to [employer] --      Knee/Hip Exercises: Supine   Bridges 15 reps    Straight Leg Raises 10 reps;Both    Straight Leg Raises Limitations --    Other Supine Knee/Hip Exercises Clam Blue TB x 20  alternating      Knee/Hip Exercises: Sidelying   Hip ABduction --      Manual Therapy   Soft tissue mobilization --                     PT Education - 07/07/21 2123     Education Details discussed expected PT outcome,  benefits, and activitiy recommendations.    Person(s) Educated Patient    Methods Explanation;Demonstration;Tactile cues;Verbal cues    Comprehension Verbalized understanding;Returned demonstration;Verbal cues required;Tactile cues required              PT Short Term Goals - 06/16/21 2127       PT SHORT TERM GOAL #1   Title Pt will become independent with HEP in order to demonstrate synthesis of PT education.    Time 2    Period Weeks    Status New    Target Date 06/30/21               PT Long Term Goals - 06/16/21 2128       PT LONG TERM GOAL #1   Title Pt will become independent with final HEP in order to demonstrate synthesis of PT education.    Time 8    Period Weeks    Status New    Target Date 08/11/21      PT LONG TERM GOAL #2   Title Pt to demo improved strength of L hip to at least 4+/5, to improve stability and pain with activity.    Time 8    Period Weeks    Status New    Target Date 08/11/21      PT LONG TERM GOAL #3   Title Pt will be able to report ability to walk for >2 miles without pain in order to demonstrate functionl improvement and tolerance to repetitive loading similar to daily exercise activity.    Time 8    Period Weeks     Status New    Target Date 08/11/21      PT LONG TERM GOAL #4   Title Pt  to report decreased pain in L hip to 0-2/10 with standing, walking, and transfers.    Time 8    Period Weeks    Status New    Target Date 08/11/21                   Plan - 07/07/21 2133     Clinical Impression Statement Pt with widespread hip pain, variable depending on activity. She is doing well wtih strengthening, and will benefit from continued strength and stability progression. Discussed allowing longer time frame for strengthening and optimal outcome with pt today.    Personal Factors and Comorbidities Age;Time since onset of injury/illness/exacerbation;Comorbidity 1;Comorbidity 2;Past/Current Experience    Examination-Activity Limitations Locomotion Level;Stairs;Transfers;Stand    Examination-Participation Restrictions Community Activity;Yard Work;Shop;Other;Driving    Stability/Clinical Decision Making Evolving/Moderate complexity    Rehab Potential Good    PT Frequency 2x / week   1-2x   PT Duration 8 weeks   POC written for 12 weeks. Likely to D/C at 8 weeks   PT Treatment/Interventions ADLs/Self Care Home Management;Cryotherapy;Electrical Stimulation;Iontophoresis 4mg /ml Dexamethasone;Moist Heat;Balance training;Therapeutic exercise;Therapeutic activities;Functional mobility training;Stair training;Gait training;DME Instruction;Ultrasound;Neuromuscular re-education;Patient/family education;Orthotic Fit/Training;Manual techniques;Vasopneumatic Device;Passive range of motion;Dry needling;Taping;Joint Manipulations;Spinal Manipulations    PT Home Exercise Plan [id number]    Consulted and Agree with Plan of Care Patient             Patient will benefit from skilled therapeutic intervention in order to improve the following deficits and impairments:  Abnormal gait, Pain, Impaired flexibility, Difficulty walking, Decreased balance, Decreased activity tolerance, Decreased range of motion, Decreased  strength, Hypomobility, Decreased mobility, Increased muscle spasms, Decreased endurance  Visit Diagnosis: Pain in left hip  Muscle weakness (generalized)     Problem List Patient  Active Problem List   Diagnosis Date Noted   Trochanteric bursitis, left hip 01/10/2020   Hypothyroid 02/12/2016    Lyndee Hensen, PT, DPT 9:59 PM  07/07/21    [place], [place], [address] Phone: [phone]   Fax:  [phone]  Name: Corryn Madewell MRN: [id number] Date of Birth: Jul 09, 1951

## 2021-07-09 ENCOUNTER — Encounter: Payer: Self-pay | Admitting: Physical Therapy

## 2021-07-09 ENCOUNTER — Telehealth: Payer: Self-pay

## 2021-07-09 ENCOUNTER — Other Ambulatory Visit: Payer: Self-pay

## 2021-07-09 ENCOUNTER — Ambulatory Visit (INDEPENDENT_AMBULATORY_CARE_PROVIDER_SITE_OTHER): Payer: Medicare Other | Admitting: Physical Therapy

## 2021-07-09 DIAGNOSIS — M6281 Muscle weakness (generalized): Secondary | ICD-10-CM

## 2021-07-09 DIAGNOSIS — M25552 Pain in left hip: Secondary | ICD-10-CM | POA: Diagnosis not present

## 2021-07-09 NOTE — Telephone Encounter (Signed)
Error

## 2021-07-09 NOTE — Therapy (Signed)
Natasha Norman [address] [place], [place], [address] Phone: [phone]   Fax:  [phone]  Physical Therapy Treatment  Patient Details  Name: Natasha Norman MRN: [id number] Date of Birth: [birth year] Referring Provider (PT): Dr. Georgina Snell   Encounter Date: 07/09/2021   PT End of Session - 07/09/21 1207     Visit Number 10    Number of Visits 16    Date for PT Re-Evaluation 08/11/21    Authorization Type Medicare  (5 visits prior)    PT Start Time 6734    PT Stop Time 1100    PT Time Calculation (min) 45 min    Activity Tolerance Patient tolerated treatment well;Patient limited by pain    Behavior During Therapy [facility] for tasks assessed/performed             Past Medical History:  Diagnosis Date   Cancer ([place]) 1996   Melanoma   Chicken pox    H/O repair of rotator cuff    Right    Hypothyroidism    Osteopenia    Seasonal allergies     Past Surgical History:  Procedure Laterality Date   BREAST BIOPSY Right 1984, 1996   Benign   lymph node resection  1998    There were no vitals filed for this visit.   Subjective Assessment - 07/09/21 1203     Subjective Pt states continued pain, not too painful yet today.    Currently in Pain? Yes    Pain Location Hip    Pain Orientation Left    Pain Descriptors / Indicators Aching    Pain Type Chronic pain    Pain Onset More than a month ago    Pain Frequency Intermittent                               [facility] Adult PT Treatment/Exercise - 07/09/21 0001       Knee/Hip Exercises: Stretches   Active Hamstring Stretch 3 reps;30 seconds    Piriformis Stretch 2 reps;20 seconds    Piriformis Stretch Limitations supine      Knee/Hip Exercises: Aerobic   Recumbent Bike L2 x 8 min;      Knee/Hip Exercises: Standing   Forward Step Up --    Functional Squat 20 reps    Functional Squat Limitations mini squat at mat table    Other Standing Knee Exercises  lateral walks no band x2, RTB x 4    Other Standing Knee Exercises Walk/march fwd, bwd 20 ft x 4;      Knee/Hip Exercises: Seated   Long Arc Quad 20 reps;Both    Long [employer] Limitations 4      Knee/Hip Exercises: Supine   Bridges 15 reps    Bridges Limitations Blue TB    Straight Leg Raises 10 reps;Both    Other Supine Knee/Hip Exercises Clam Blue TB x 20  alternating      Knee/Hip Exercises: Sidelying   Hip ABduction 10 reps;Left    Other Sidelying Knee/Hip Exercises side plank/knees 30 sec x 3; on L;                       PT Short Term Goals - 06/16/21 2127       PT SHORT TERM GOAL #1   Title Pt will become independent with HEP in order to demonstrate synthesis of PT education.  Time 2    Period Weeks    Status New    Target Date 06/30/21               PT Long Term Goals - 06/16/21 2128       PT LONG TERM GOAL #1   Title Pt will become independent with final HEP in order to demonstrate synthesis of PT education.    Time 8    Period Weeks    Status New    Target Date 08/11/21      PT LONG TERM GOAL #2   Title Pt to demo improved strength of L hip to at least 4+/5, to improve stability and pain with activity.    Time 8    Period Weeks    Status New    Target Date 08/11/21      PT LONG TERM GOAL #3   Title Pt will be able to report ability to walk for >2 miles without pain in order to demonstrate functionl improvement and tolerance to repetitive loading similar to daily exercise activity.    Time 8    Period Weeks    Status New    Target Date 08/11/21      PT LONG TERM GOAL #4   Title Pt  to report decreased pain in L hip to 0-2/10 with standing, walking, and transfers.    Time 8    Period Weeks    Status New    Target Date 08/11/21                   Plan - 07/09/21 1208     Clinical Impression Statement Pt with soreness in lateral hip today, mostly with abduction motions as well as side stepping. Main focus has been  strengthening in pain free ranges.Discussed more icing at home, if pt still getting quite sore from standing and increased activity.    Personal Factors and Comorbidities Age;Time since onset of injury/illness/exacerbation;Comorbidity 1;Comorbidity 2;Past/Current Experience    Examination-Activity Limitations Locomotion Level;Stairs;Transfers;Stand    Examination-Participation Restrictions Community Activity;Yard Work;Shop;Other;Driving    Stability/Clinical Decision Making Evolving/Moderate complexity    Rehab Potential Good    PT Frequency 2x / week   1-2x   PT Duration 8 weeks   POC written for 12 weeks. Likely to D/C at 8 weeks   PT Treatment/Interventions ADLs/Self Care Home Management;Cryotherapy;Electrical Stimulation;Iontophoresis 4mg /ml Dexamethasone;Moist Heat;Balance training;Therapeutic exercise;Therapeutic activities;Functional mobility training;Stair training;Gait training;DME Instruction;Ultrasound;Neuromuscular re-education;Patient/family education;Orthotic Fit/Training;Manual techniques;Vasopneumatic Device;Passive range of motion;Dry needling;Taping;Joint Manipulations;Spinal Manipulations    PT Home Exercise Plan [id number]    Consulted and Agree with Plan of Care Patient             Patient will benefit from skilled therapeutic intervention in order to improve the following deficits and impairments:  Abnormal gait, Pain, Impaired flexibility, Difficulty walking, Decreased balance, Decreased activity tolerance, Decreased range of motion, Decreased strength, Hypomobility, Decreased mobility, Increased muscle spasms, Decreased endurance  Visit Diagnosis: Pain in left hip  Muscle weakness (generalized)     Problem List Patient Active Problem List   Diagnosis Date Noted   Trochanteric bursitis, left hip 01/10/2020   Hypothyroid 02/12/2016    Natasha Norman, PT, DPT 12:10 PM  07/09/21    [place], [place], [address] Phone: [phone]   Fax:  [phone]  Name: Natasha Norman MRN: [id number] Date of Birth: April 22, 1951

## 2021-07-13 ENCOUNTER — Ambulatory Visit (INDEPENDENT_AMBULATORY_CARE_PROVIDER_SITE_OTHER): Payer: Medicare Other | Admitting: Physical Therapy

## 2021-07-13 ENCOUNTER — Other Ambulatory Visit: Payer: Self-pay

## 2021-07-13 DIAGNOSIS — M6281 Muscle weakness (generalized): Secondary | ICD-10-CM | POA: Diagnosis not present

## 2021-07-13 DIAGNOSIS — M25552 Pain in left hip: Secondary | ICD-10-CM

## 2021-07-14 ENCOUNTER — Encounter: Payer: Self-pay | Admitting: Physical Therapy

## 2021-07-14 NOTE — Therapy (Signed)
[facility] [address] [place], [place], [address] Phone: [phone]   Fax:  [phone]  Physical Therapy Treatment  Patient Details  Name: Natasha Norman MRN: [id number] Date of Birth: [birth year] Referring Provider (PT): Dr. Georgina Snell   Encounter Date: 07/13/2021   PT End of Session - 07/14/21 0924     Visit Number 11    Number of Visits 16    Date for PT Re-Evaluation 08/11/21    Authorization Type Medicare  (5 visits prior)  re-eval at visit 6;    PT Start Time 5809    PT Stop Time 1429    PT Time Calculation (min) 41 min    Activity Tolerance Patient tolerated treatment well;Patient limited by pain    Behavior During Therapy [facility] for tasks assessed/performed             Past Medical History:  Diagnosis Date   Cancer ([place]) 1996   Melanoma   Chicken pox    H/O repair of rotator cuff    Right    Hypothyroidism    Osteopenia    Seasonal allergies     Past Surgical History:  Procedure Laterality Date   BREAST BIOPSY Right 1984, 1996   Benign   lymph node resection  1998    There were no vitals filed for this visit.   Subjective Assessment - 07/14/21 0922     Subjective Pt states minimal changes in pain.    Currently in Pain? Yes    Pain Score 3     Pain Location Hip    Pain Orientation Left    Pain Descriptors / Indicators Aching    Pain Type Chronic pain    Pain Onset More than a month ago    Pain Frequency Intermittent                               [facility] Adult PT Treatment/Exercise - 07/14/21 0001       Knee/Hip Exercises: Stretches   Hip Flexor Stretch 2 reps;30 seconds    Hip Flexor Stretch Limitations kneeling      Knee/Hip Exercises: Aerobic   Recumbent Bike L2 x 8 min;      Knee/Hip Exercises: Standing   Functional Squat 20 reps    Functional Squat Limitations mini squat at mat table    Other Standing Knee Exercises lateral walks YTB x 6, no squat. (pain w squat  walk)    Other Standing Knee Exercises Walk/march fwd, bwd 20 ft x 4;      Knee/Hip Exercises: Supine   Bridges with [employer] 20 reps    Straight Leg Raises 10 reps;Both      Knee/Hip Exercises: Sidelying   Hip ABduction Left;20 reps      Manual Therapy   Soft tissue mobilization DTM/TPR to L glute med, min, piriformis.                       PT Short Term Goals - 06/16/21 2127       PT SHORT TERM GOAL #1   Title Pt will become independent with HEP in order to demonstrate synthesis of PT education.    Time 2    Period Weeks    Status New    Target Date 06/30/21               PT Long Term Goals - 06/16/21 2128  PT LONG TERM GOAL #1   Title Pt will become independent with final HEP in order to demonstrate synthesis of PT education.    Time 8    Period Weeks    Status New    Target Date 08/11/21      PT LONG TERM GOAL #2   Title Pt to demo improved strength of L hip to at least 4+/5, to improve stability and pain with activity.    Time 8    Period Weeks    Status New    Target Date 08/11/21      PT LONG TERM GOAL #3   Title Pt will be able to report ability to walk for >2 miles without pain in order to demonstrate functionl improvement and tolerance to repetitive loading similar to daily exercise activity.    Time 8    Period Weeks    Status New    Target Date 08/11/21      PT LONG TERM GOAL #4   Title Pt  to report decreased pain in L hip to 0-2/10 with standing, walking, and transfers.    Time 8    Period Weeks    Status New    Target Date 08/11/21                   Plan - 07/14/21 9678     Clinical Impression Statement Pt continues to have widespread pain in hip complex. She has been able to do well with strengthening, in pain free ranges. She has much tenderness in lateral hip and glute with manual tissue release today. Unable to perform side stepping in squat position due to increased pain, but able to do in more upright  position today. Pt doing out of town next week, will return when she gets back. Updated and reviewed HEP today.    Personal Factors and Comorbidities Age;Time since onset of injury/illness/exacerbation;Comorbidity 1;Comorbidity 2;Past/Current Experience    Examination-Activity Limitations Locomotion Level;Stairs;Transfers;Stand    Examination-Participation Restrictions Community Activity;Yard Work;Shop;Other;Driving    Stability/Clinical Decision Making Evolving/Moderate complexity    Rehab Potential Good    PT Frequency 2x / week   1-2x   PT Duration 8 weeks   POC written for 12 weeks. Likely to D/C at 8 weeks   PT Treatment/Interventions ADLs/Self Care Home Management;Cryotherapy;Electrical Stimulation;Iontophoresis 4mg /ml Dexamethasone;Moist Heat;Balance training;Therapeutic exercise;Therapeutic activities;Functional mobility training;Stair training;Gait training;DME Instruction;Ultrasound;Neuromuscular re-education;Patient/family education;Orthotic Fit/Training;Manual techniques;Vasopneumatic Device;Passive range of motion;Dry needling;Taping;Joint Manipulations;Spinal Manipulations    PT Home Exercise Plan [email or web address]    Consulted and Agree with Plan of Care Patient             Patient will benefit from skilled therapeutic intervention in order to improve the following deficits and impairments:  Abnormal gait, Pain, Impaired flexibility, Difficulty walking, Decreased balance, Decreased activity tolerance, Decreased range of motion, Decreased strength, Hypomobility, Decreased mobility, Increased muscle spasms, Decreased endurance  Visit Diagnosis: Pain in left hip  Muscle weakness (generalized)     Problem List Patient Active Problem List   Diagnosis Date Noted   Trochanteric bursitis, left hip 01/10/2020   Hypothyroid 02/12/2016   Lyndee Hensen, PT, DPT 9:32 AM  07/14/21    [facility] [place], [place],  [address] Phone: [phone]   Fax:  [phone]  Name: Natasha Norman MRN: [id number] Date of Birth: [birth year]

## 2021-07-16 ENCOUNTER — Encounter: Payer: Medicare Other | Admitting: Physical Therapy

## 2021-07-29 ENCOUNTER — Ambulatory Visit (INDEPENDENT_AMBULATORY_CARE_PROVIDER_SITE_OTHER): Payer: Medicare Other | Admitting: Physical Therapy

## 2021-07-29 ENCOUNTER — Encounter: Payer: Self-pay | Admitting: Physical Therapy

## 2021-07-29 ENCOUNTER — Other Ambulatory Visit: Payer: Self-pay

## 2021-07-29 DIAGNOSIS — M25552 Pain in left hip: Secondary | ICD-10-CM

## 2021-07-29 DIAGNOSIS — M6281 Muscle weakness (generalized): Secondary | ICD-10-CM | POA: Diagnosis not present

## 2021-07-29 NOTE — Therapy (Signed)
[facility] [address] [place], [place], [address] Phone: [phone]   Fax:  [phone]  Physical Therapy Treatment/Discharge   Patient Details  Name: Natasha Norman MRN: [id number] Date of Birth: 04-29-1951 Referring Provider (PT): Dr. Georgina Snell   Encounter Date: 07/29/2021   PT End of Session - 07/29/21 1032     Visit Number 12    Number of Visits 16    Date for PT Re-Evaluation 08/11/21    Authorization Type Medicare  (5 visits prior)  re-eval at visit 6;    PT Start Time 1020    PT Stop Time 1100    PT Time Calculation (min) 40 min    Activity Tolerance Patient tolerated treatment well;Patient limited by pain    Behavior During Therapy [facility] for tasks assessed/performed             Past Medical History:  Diagnosis Date   Cancer ([place]) 1996   Melanoma   Chicken pox    H/O repair of rotator cuff    Right    Hypothyroidism    Osteopenia    Seasonal allergies     Past Surgical History:  Procedure Laterality Date   BREAST BIOPSY Right 1984, 1996   Benign   lymph node resection  1998    There were no vitals filed for this visit.   Subjective Assessment - 07/29/21 1030     Subjective Pt last seen 2 weeks ago, she has been out of town. States continued pain, minimal changes. Still feels limited with ability for activity, due to pain daily.    Patient Stated Goals Decreased pain    Currently in Pain? Yes    Pain Score 3     Pain Location Hip    Pain Orientation Left    Pain Descriptors / Indicators Aching    Pain Type Chronic pain    Pain Onset More than a month ago    Pain Frequency Intermittent    Aggravating Factors  increased up to 5/10 with standing, sitting long periods and initial standing.                               [facility] PT Treatment/Exercise - 07/29/21 0001       Knee/Hip Exercises: Stretches   Hip Flexor Stretch --    Hip Flexor Stretch Limitations --       Knee/Hip Exercises: Aerobic   Recumbent Bike L2 x 8 min;      Knee/Hip Exercises: Standing   Functional Squat 20 reps    Functional Squat Limitations mini squat at mat table    Other Standing Knee Exercises Walk/march fwd, bwd 20 ft x 4;      Knee/Hip Exercises: Supine   Bridges with Clamshell 20 reps    Straight Leg Raises 10 reps;Both    Other Supine Knee/Hip Exercises Supine march BlTB x 20;      Knee/Hip Exercises: Sidelying   Hip ABduction --    Hip ABduction Limitations too painful    Clams x15 on r      Manual Therapy   Soft tissue mobilization --                     PT Education - 07/29/21 1031     Education Details Discussed PT plan and return to MD, reviewed HEP.    Person(s) Educated Patient    Methods Explanation;Demonstration;Tactile  cues;Verbal cues;Handout    Comprehension Verbalized understanding;Returned demonstration;Verbal cues required;Tactile cues required              PT Short Term Goals - 07/29/21 1032       PT SHORT TERM GOAL #1   Title Pt will become independent with HEP in order to demonstrate synthesis of PT education.    Time 2    Period Weeks    Status Achieved    Target Date 06/30/21               PT Long Term Goals - 07/29/21 1342       PT LONG TERM GOAL #1   Title Pt will become independent with final HEP in order to demonstrate synthesis of PT education.    Time 8    Period Weeks    Status Achieved      PT LONG TERM GOAL #2   Title Pt to demo improved strength of L hip to at least 4+/5, to improve stability and pain with activity.    Time 8    Period Weeks    Status Partially Met      PT LONG TERM GOAL #3   Title Pt will be able to report ability to walk for >2 miles without pain in order to demonstrate functionl improvement and tolerance to repetitive loading similar to daily exercise activity.    Time 8    Period Weeks    Status Not Met      PT LONG TERM GOAL #4   Title Pt  to report decreased  pain in L hip to 0-2/10 with standing, walking, and transfers.    Time 8    Period Weeks    Status Not Met                   Plan - 07/29/21 1342     Clinical Impression Statement Pt has been seen for 6 PT visits, (12 total, with previous visits ) . She continues to have pain with most all activities. Pt is able to do most regular daily activities, but has constant pain with weight bearing. She has not had significant pain relief in the last several weeks. She has widespread hip pain, in groin, as well as in lateral hip and in posterior hip/glute.  She has been able to do well with strengthening, and is doing well with HEP. Certain motions still too painful (side stepping, and s/l hip abd ). We discussed return to MD at this time, for further assessment. Pt will continue to do HEP. Pt in agreement with plan.    Personal Factors and Comorbidities Age;Time since onset of injury/illness/exacerbation;Comorbidity 1;Comorbidity 2;Past/Current Experience    Examination-Activity Limitations Locomotion Level;Stairs;Transfers;Stand    Examination-Participation Restrictions Community Activity;Yard Work;Shop;Other;Driving    Stability/Clinical Decision Making Evolving/Moderate complexity    Rehab Potential Good    PT Frequency 2x / week   1-2x   PT Duration 8 weeks   POC written for 12 weeks. Likely to D/C at 8 weeks   PT Treatment/Interventions ADLs/Self Care Home Management;Cryotherapy;Electrical Stimulation;Iontophoresis 22m/ml Dexamethasone;Moist Heat;Balance training;Therapeutic exercise;Therapeutic activities;Functional mobility training;Stair training;Gait training;DME Instruction;Ultrasound;Neuromuscular re-education;Patient/family education;Orthotic Fit/Training;Manual techniques;Vasopneumatic Device;Passive range of motion;Dry needling;Taping;Joint Manipulations;Spinal Manipulations    PT Home Exercise Plan [id number]   Consulted and Agree with Plan of Care Patient              Patient will benefit from skilled therapeutic intervention in order to improve the following deficits and  impairments:  Abnormal gait, Pain, Impaired flexibility, Difficulty walking, Decreased balance, Decreased activity tolerance, Decreased range of motion, Decreased strength, Hypomobility, Decreased mobility, Increased muscle spasms, Decreased endurance  Visit Diagnosis: Pain in left hip  Muscle weakness (generalized)     Problem List Patient Active Problem List   Diagnosis Date Noted   Trochanteric bursitis, left hip 01/10/2020   Hypothyroid 02/12/2016   Lyndee Hensen, PT, DPT 1:47 PM  07/29/21    [facility] [place], [place], [address] Phone: [phone]   Fax:  [phone]  Name: Natasha Norman MRN: [id number] Date of Birth: [birth year]  PHYSICAL THERAPY DISCHARGE SUMMARY  Visits from Start of Care: 12 Plan: Patient agrees to discharge.  Patient goals were not met. Patient having  continued pain, referral back to MD. Will hold at this time for pt to see MD.     Lyndee Hensen, PT, DPT 1:47 PM  07/29/21

## 2021-07-30 NOTE — Progress Notes (Signed)
I, Wendy Poet, LAT, ATC, am serving as scribe for Dr. [name].  Natasha Norman is a 70 y.o. female who presents to [facility] today for  f/u of L hip pain due to mild hip arthritis, a small labrum tear, hip ABD tendinopathy/tear, bursitis, and hamstring tendinitis.  She was last seen by Dr. Georgina Snell on 05/28/21 and was advised to avoid excessive hip flexion, continue PT (adding hamstring to PT order), completing 12 total visits. She had a prior L hip joint injection on 7/12/ 22 and a L GT injection on 12/15/20.  Today, pt reports that her L hip and thigh pain is about the same, maybe slightly improved but hard to tell.   She has completed PT and been discharged to a HEP.  The most helpful/successful treatment she's had so far has been the L GT injection in March 2022.  That injection provided benefit for 3 to 4 weeks.  She has had intra-articular steroid injection which did not help very much.  Dx imaging: 05/25/21 L hip MRI  04/07/21 L hip XR  04/13/20 L hip MRI  12/14/19 L-hip XR  Pertinent review of systems: No fevers or chills  Relevant historical information: Hypothyroidism   Exam:  BP 110/78 (BP Location: Right Arm, Patient Position: Sitting, Cuff Size: Normal)   Pulse 90   Ht 5\' 5"  (1.651 m)   Wt 130 lb (59 kg)   SpO2 99%   BMI 21.63 kg/m  General: Well Developed, well nourished, and in no acute distress.   MSK: Left hip normal motion.  Normal gait.  Tender palpation greater trochanter.    Lab and Radiology Results EXAM: MRI OF THE LEFT HIP WITH CONTRAST (MR Arthrogram)   TECHNIQUE: Multiplanar, multisequence MR imaging of the hip was performed immediately following contrast injection into the hip joint under fluoroscopic guidance. No intravenous contrast was administered.   COMPARISON:  X-ray 04/07/2021, MRI 04/13/2020   FINDINGS: Bones: No acute fracture. No dislocation. No femoral head avascular necrosis. Bony pelvis intact  without diastasis. Mild arthropathy of the SI joints and pubic symphysis. No bone marrow edema. No marrow replacing bone lesion.   Articular cartilage and labrum   Articular cartilage:  Mild chondral thinning without focal defect.   Labrum: Small nondisplaced anterosuperior labral tear (series 8, image 12). No paralabral cyst.   Joint or bursal effusion   Joint effusion: Well distended with injected contrast. A small amount of extracapsular contrast extends into the anterior soft tissues and is likely related to injection.   Bursae: Left peritrochanteric bursal edema.   Muscles and tendons   Muscles and tendons: Tendinosis and low-grade insertional tearing of the gluteus medius tendon. Moderate tendinosis of the distal gluteus minimus tendon. Mild tendinosis of the left hamstring tendon origin. Remaining tendinous structures about the left hip are intact. Normal muscle bulk and signal intensity without edema, atrophy, or fatty infiltration.   Other findings   Miscellaneous: No inguinal lymphadenopathy. Stable 2.5 cm simple appearing cyst is again seen within the left adnexa. No follow-up imaging recommended. Note: This recommendation does not apply to premenarchal patients and to those with increased risk (genetic, family history, elevated tumor markers or other high-risk factors) of ovarian cancer. Reference: JACR 2020 Feb; 17(2):248-254   IMPRESSION: 1. Mild left hip osteoarthritis. Small nondisplaced anterosuperior labral tear. 2. Tendinosis and low-grade insertional tearing of the gluteus medius tendon. Moderate tendinosis of the distal gluteus minimus tendon. Mild left peritrochanteric bursitis 3. Mild  tendinosis of the left hamstring tendon origin.     Electronically Signed   By: Davina Poke D.O.   On: 05/25/2021 16:27 I, [name], personally (independently) visualized and performed the interpretation of the images attached in this  note.      Assessment and Plan: 70 y.o. female with left hip pain.  Multifactorial. Patient predominantly has lateral and anterior hip pain.  Lateral hip pain due to the hip abductor tendinopathy with low-grade tearing seen on MRI arthrogram dated May 25, 2021.  Anterior hip pain thought to be due to labrum tear seen on the same day. Patient has failed conservative management strategies for the lateral hip pain with good trial of physical therapy and trials of steroid injection. Next obvious step on my end would be trial of PRP injection. After lengthy discussion I think it is reasonable for her to have a second opinion/surgical opinion with a orthopedic surgeon who could potentially do a hip scope.  Refer to Dr. Henrene Hawking health. If no surgery is reasonable patient may consider PRP injection to the insertional hip abductor tendinopathy in the lateral hip.   PDMP not reviewed this encounter. Orders Placed This Encounter  Procedures   Ambulatory referral to Orthopedic Surgery    Referral Priority:   Routine    Referral Type:   Surgical    Referral Reason:   Specialty Services Required    Requested Specialty:   Orthopedic Surgery    Number of Visits Requested:   1   No orders of the defined types were placed in this encounter.    Discussed warning signs or symptoms. Please see discharge instructions. Patient expresses understanding.   The above documentation has been reviewed and is accurate and complete [name], M.D.  Total encounter time 30 minutes including face-to-face time with the patient and, reviewing past medical record, and charting on the date of service.   Treatment plan and options MRI review

## 2021-07-31 ENCOUNTER — Ambulatory Visit (INDEPENDENT_AMBULATORY_CARE_PROVIDER_SITE_OTHER): Payer: Medicare Other | Admitting: Family Medicine

## 2021-07-31 ENCOUNTER — Encounter: Payer: Self-pay | Admitting: Family Medicine

## 2021-07-31 ENCOUNTER — Other Ambulatory Visit: Payer: Self-pay

## 2021-07-31 ENCOUNTER — Encounter: Payer: Medicare Other | Admitting: Physical Therapy

## 2021-07-31 VITALS — BP 110/78 | HR 90 | Ht 65.0 in | Wt 130.0 lb

## 2021-07-31 DIAGNOSIS — M25552 Pain in left hip: Secondary | ICD-10-CM

## 2021-07-31 DIAGNOSIS — S73199A Other sprain of unspecified hip, initial encounter: Secondary | ICD-10-CM | POA: Insufficient documentation

## 2021-07-31 DIAGNOSIS — S73192D Other sprain of left hip, subsequent encounter: Secondary | ICD-10-CM

## 2021-07-31 DIAGNOSIS — M7062 Trochanteric bursitis, left hip: Secondary | ICD-10-CM

## 2021-07-31 NOTE — Patient Instructions (Addendum)
Good to see you today.  I've placed a referred to Dr. Aretha Parrot at [facility] back if needed

## 2021-08-03 ENCOUNTER — Encounter: Payer: Medicare Other | Admitting: Physical Therapy

## 2021-08-04 DIAGNOSIS — L821 Other seborrheic keratosis: Secondary | ICD-10-CM | POA: Diagnosis not present

## 2021-08-04 DIAGNOSIS — L814 Other melanin hyperpigmentation: Secondary | ICD-10-CM | POA: Diagnosis not present

## 2021-08-04 DIAGNOSIS — Z85820 Personal history of malignant melanoma of skin: Secondary | ICD-10-CM | POA: Diagnosis not present

## 2021-08-04 DIAGNOSIS — Z08 Encounter for follow-up examination after completed treatment for malignant neoplasm: Secondary | ICD-10-CM | POA: Diagnosis not present

## 2021-08-04 DIAGNOSIS — L2089 Other atopic dermatitis: Secondary | ICD-10-CM | POA: Diagnosis not present

## 2021-08-04 DIAGNOSIS — L218 Other seborrheic dermatitis: Secondary | ICD-10-CM | POA: Diagnosis not present

## 2021-08-04 DIAGNOSIS — C4362 Malignant melanoma of left upper limb, including shoulder: Secondary | ICD-10-CM | POA: Diagnosis not present

## 2021-08-04 DIAGNOSIS — Z09 Encounter for follow-up examination after completed treatment for conditions other than malignant neoplasm: Secondary | ICD-10-CM | POA: Diagnosis not present

## 2021-08-04 DIAGNOSIS — Z872 Personal history of diseases of the skin and subcutaneous tissue: Secondary | ICD-10-CM | POA: Diagnosis not present

## 2021-08-04 DIAGNOSIS — D2372 Other benign neoplasm of skin of left lower limb, including hip: Secondary | ICD-10-CM | POA: Diagnosis not present

## 2021-08-05 ENCOUNTER — Encounter: Payer: Medicare Other | Admitting: Physical Therapy

## 2021-08-11 ENCOUNTER — Encounter: Payer: Self-pay | Admitting: Family Medicine

## 2021-08-12 NOTE — Telephone Encounter (Signed)
Spoke to patient and informed.

## 2021-09-07 ENCOUNTER — Encounter: Payer: Self-pay | Admitting: Family Medicine

## 2021-09-07 DIAGNOSIS — M25552 Pain in left hip: Secondary | ICD-10-CM | POA: Diagnosis not present

## 2021-09-22 ENCOUNTER — Ambulatory Visit: Payer: Medicare Other

## 2021-09-22 ENCOUNTER — Other Ambulatory Visit: Payer: Self-pay

## 2021-09-22 ENCOUNTER — Ambulatory Visit (INDEPENDENT_AMBULATORY_CARE_PROVIDER_SITE_OTHER): Payer: Self-pay | Admitting: Family Medicine

## 2021-09-22 DIAGNOSIS — M25552 Pain in left hip: Secondary | ICD-10-CM

## 2021-09-22 DIAGNOSIS — M7062 Trochanteric bursitis, left hip: Secondary | ICD-10-CM

## 2021-09-22 NOTE — Progress Notes (Signed)
Natasha Norman presents to clinic today for PRP injection left hip greater trochanter insertion gluteus medius.  Procedure: Real-time Ultrasound Guided Injection of left hip gluteus medius insertion at greater trochanter with PRP Device: Philips Affiniti 50G Images permanently stored and available for review in PACS Verbal informed consent obtained.  Discussed risks and benefits of procedure. Warned about infection bleeding damage to structures skin hypopigmentation and fat atrophy among others. Patient expresses understanding and agreement Time-out conducted.   Noted no overlying erythema, induration, or other signs of local infection.   Skin prepped in a sterile fashion.   Local anesthesia: Topical Ethyl chloride.   With sterile technique and under real time ultrasound guidance: 5 mL of Marcaine injected into the planned injection site of the lateral hip greater trochanter at gluteus medius insertion site to achieve good anesthesia. Skin was again sterilized with isopropyl alcohol and a spinal needle was used to access the gluteus medius insertion site under ultrasound guidance. 6 mL of leukocyte rich PRP was injected in a fan pattern along the insertion site of the gluteus medius tendon with a tenotomy tapping pattern. . Fluid seen entering the insertion site of the gluteus medius tendon.   Completed without difficulty   Pain immediately resolved suggesting accurate placement of the Marcaine.   Advised to call if fevers/chills, erythema, induration, drainage, or persistent bleeding.   Images permanently stored and available for review in the ultrasound unit.  Impression: Technically successful ultrasound guided injection.

## 2021-09-22 NOTE — Patient Instructions (Addendum)
Thank you for coming in today.   You received a PRP injection today. Seek immediate medical attention if the joint becomes red, extremely painful, or is oozing fluid.   Take it easy over the next few days and avoid ibuprofen  Recheck back as needed

## 2021-10-20 ENCOUNTER — Ambulatory Visit (INDEPENDENT_AMBULATORY_CARE_PROVIDER_SITE_OTHER): Payer: Medicare Other | Admitting: Adult Health

## 2021-10-20 ENCOUNTER — Encounter: Payer: Self-pay | Admitting: Adult Health

## 2021-10-20 VITALS — BP 110/80 | HR 111 | Temp 97.7°F | Ht 65.0 in | Wt 125.8 lb

## 2021-10-20 DIAGNOSIS — E559 Vitamin D deficiency, unspecified: Secondary | ICD-10-CM | POA: Diagnosis not present

## 2021-10-20 DIAGNOSIS — E7800 Pure hypercholesterolemia, unspecified: Secondary | ICD-10-CM

## 2021-10-20 DIAGNOSIS — C439 Malignant melanoma of skin, unspecified: Secondary | ICD-10-CM

## 2021-10-20 DIAGNOSIS — E039 Hypothyroidism, unspecified: Secondary | ICD-10-CM | POA: Diagnosis not present

## 2021-10-20 LAB — LIPID PANEL
Cholesterol: 203 mg/dL — ABNORMAL HIGH (ref 0–200)
HDL: 96.4 mg/dL (ref >39.00)
LDL Cholesterol: 89 mg/dL (ref 0–99)
NonHDL: 106.28
Total CHOL/HDL Ratio: 2
Triglycerides: 87 mg/dL (ref 0.0–149.0)
VLDL: 17.4 mg/dL (ref 0.0–40.0)

## 2021-10-20 LAB — CBC WITH DIFFERENTIAL/PLATELET
Basophils Absolute: 0.1 10*3/uL (ref 0.0–0.1)
Basophils Relative: 1.2 % (ref 0.0–3.0)
Eosinophils Absolute: 0.2 10*3/uL (ref 0.0–0.7)
Eosinophils Relative: 3.9 % (ref 0.0–5.0)
HCT: 43.5 % (ref 36.0–46.0)
Hemoglobin: 14.3 g/dL (ref 12.0–15.0)
Lymphocytes Relative: 36.7 % (ref 12.0–46.0)
Lymphs Abs: 1.7 10*3/uL (ref 0.7–4.0)
MCHC: 32.9 g/dL (ref 30.0–36.0)
MCV: 93 fl (ref 78.0–100.0)
Monocytes Absolute: 0.4 10*3/uL (ref 0.1–1.0)
Monocytes Relative: 9.3 % (ref 3.0–12.0)
Neutro Abs: 2.3 10*3/uL (ref 1.4–7.7)
Neutrophils Relative %: 48.9 % (ref 43.0–77.0)
Platelets: 391 10*3/uL (ref 150.0–400.0)
RBC: 4.68 Mil/uL (ref 3.87–5.11)
RDW: 13 % (ref 11.5–15.5)
WBC: 4.7 10*3/uL (ref 4.0–10.5)

## 2021-10-20 LAB — COMPREHENSIVE METABOLIC PANEL
ALT: 14 U/L (ref 0–35)
AST: 21 U/L (ref 0–37)
Albumin: 4.5 g/dL (ref 3.5–5.2)
Alkaline Phosphatase: 53 U/L (ref 39–117)
BUN: 14 mg/dL (ref 6–23)
CO2: 29 mEq/L (ref 19–32)
Calcium: 9.9 mg/dL (ref 8.4–10.5)
Chloride: 102 mEq/L (ref 96–112)
Creatinine, Ser: 0.89 mg/dL (ref 0.40–1.20)
GFR: 66.28 mL/min (ref >60.00)
Glucose, Bld: 90 mg/dL (ref 70–99)
Potassium: 4 mEq/L (ref 3.5–5.1)
Sodium: 140 mEq/L (ref 135–145)
Total Bilirubin: 0.6 mg/dL (ref 0.2–1.2)
Total Protein: 7 g/dL (ref 6.0–8.3)

## 2021-10-20 LAB — TSH: TSH: 2.43 u[IU]/mL (ref 0.35–5.50)

## 2021-10-20 LAB — VITAMIN D 25 HYDROXY (VIT D DEFICIENCY, FRACTURES): VITD: 44.73 ng/mL (ref 30.00–100.00)

## 2021-10-20 MED ORDER — LEVOTHYROXINE SODIUM 50 MCG PO TABS: 50.0000 ug | ORAL_TABLET | Freq: Every day | ORAL | 3 refills | Status: DC

## 2021-10-20 NOTE — Progress Notes (Signed)
Subjective:    Patient ID: Natasha Norman, female    DOB: Dec 31, 1950, 71 y.o.   MRN: [id number]  HPI Patient presents for yearly follow up exam. She is a pleasant 71 year old female who  has a past medical history of Cancer ([place]) (1996), Chicken pox, H/O repair of rotator cuff, Hypothyroidism, Osteopenia, and Seasonal allergies.  Hypothyroidism-takes Synthroid 50 mcg daily.  She denies symptoms of hypo or hyperthyroidism Lab Results  Component Value Date   TSH 2.15 10/21/2020   Hyperlipidemia-currently not on medication.  Has refused statin in the past Lab Results  Component Value Date   CHOL 221 (H) 10/21/2020   HDL 109.10 10/21/2020   LDLCALC 96 10/21/2020   TRIG 82.0 10/21/2020   CHOLHDL 2 10/21/2020   History of melanoma-is seen at dermatology every 3 months.  Chronic Left Hip Pain - ongoing for two years. Is being seen by sports medicine and has gone through 3 rounds of PT.  Last MRI in 04/2021 showed  IMPRESSION: 1. Mild left hip osteoarthritis. Small nondisplaced anterosuperior labral tear. 2. Tendinosis and low-grade insertional tearing of the gluteus medius tendon. Moderate tendinosis of the distal gluteus minimus tendon. Mild left peritrochanteric bursitis 3. Mild tendinosis of the left hamstring tendon origin.  Recently had a PRP injection and may be experiencing some improvement   All immunizations and health maintenance protocols were reviewed with the patient and needed orders were placed.  Appropriate screening laboratory values were ordered for the patient including screening of hyperlipidemia, renal function and hepatic function.  Medication reconciliation,  past medical history, social history, problem list and allergies were reviewed in detail with the patient  Goals were established with regard to weight loss, exercise, and  diet in compliance with medications Wt Readings from Last 3 Encounters:  10/20/21 125 lb 12.8 oz (57.1 kg)  07/31/21 130 lb  (59 kg)  05/28/21 127 lb 9.6 oz (57.9 kg)   She is up-to-date on routine colon cancer screening, mammogram, and bone density screen   Review of Systems  Constitutional: Negative.   HENT: Negative.    Eyes: Negative.   Respiratory: Negative.    Cardiovascular: Negative.   Gastrointestinal: Negative.   Endocrine: Negative.   Genitourinary: Negative.   Musculoskeletal:  Positive for arthralgias.  Skin: Negative.   Allergic/Immunologic: Negative.   Neurological: Negative.   Hematological: Negative.   Psychiatric/Behavioral: Negative.     Past Medical History:  Diagnosis Date   Cancer ([place]) 1996   Melanoma   Chicken pox    H/O repair of rotator cuff    Right    Hypothyroidism    Osteopenia    Seasonal allergies     Social History   Socioeconomic History   Marital status: Married    Spouse name: Not on file   Number of children: 3   Years of education: 4 years college   Highest education level: Bachelor's degree (e.g., BA, AB, BS)  Occupational History   Occupation: Retired  Tobacco Use   Smoking status: Never   Smokeless tobacco: Never  Substance and Sexual Activity   Alcohol use: Yes    Alcohol/week: 1.0 standard drink    Types: 1 Glasses of wine per week    Comment: Wine occasionally   Drug use: No   Sexual activity: Not on file  Other Topics Concern   Not on file  Social History Narrative   Retired [employer]   Married + Animal nutritionist from  [place]    3 children   Social Determinants of Health   Financial Resource Strain: Low Risk    Difficulty of Paying Living Expenses: Not hard at all  Food Insecurity: No Food Insecurity   Worried About Charity fundraiser in the Last Year: Never true   Arboriculturist in the Last Year: Never true  Transportation Needs: No Transportation Needs   Lack of Transportation (Medical): No   Lack of Transportation (Non-Medical): No  Physical Activity: Sufficiently Active   Days of Exercise per  Week: 7 days   Minutes of Exercise per Session: 50 min  Stress: No Stress Concern Present   Feeling of Stress : Not at all  Social Connections: Moderately Integrated   Frequency of Communication with Friends and Family: More than three times a week   Frequency of Social Gatherings with Friends and Family: Once a week   Attends Religious Services: More than 4 times per year   Active Member of [employer] or Organizations: No   Attends Archivist Meetings: Never   Marital Status: Married  Human resources officer Violence: Not At Risk   Fear of Current or Ex-Partner: No   Emotionally Abused: No   Physically Abused: No   Sexually Abused: No    Past Surgical History:  Procedure Laterality Date   BREAST BIOPSY Right 1984, 1996   Benign   lymph node resection  1998    Family History  Problem Relation Age of Onset   Cancer Mother        breast cancer   Breast cancer Mother    Heart disease Father    Diabetes Father    Heart failure Maternal Grandmother    Stroke Paternal Grandfather     No Known Allergies  Current Outpatient Medications on File Prior to Visit  Medication Sig Dispense Refill   calcium carbonate (OS-CAL) 600 MG TABS tablet Take 600 mg by mouth 2 (two) times daily with a meal.     levothyroxine (SYNTHROID) 50 MCG tablet Take 1 tablet (50 mcg total) by mouth daily before breakfast. 90 tablet 3   loratadine (CLARITIN) 10 MG tablet Take 10 mg by mouth daily.     Multiple Vitamin (MULTIVITAMIN) tablet Take 1 tablet by mouth daily.     Multiple Vitamins-Minerals (PRESERVISION AREDS PO) Take by mouth.     VITAMIN D PO Take by mouth.     No current facility-administered medications on file prior to visit.    BP 110/80 (BP Location: Left Arm, Patient Position: Sitting, Cuff Size: Normal)    Pulse (!) 111    Temp 97.7 F (36.5 C) (Oral)    Ht 5\' 5"  (1.651 m)    Wt 125 lb 12.8 oz (57.1 kg)    SpO2 97%    BMI 20.93 kg/m        Objective:   Physical Exam Vitals and  nursing note reviewed.  Constitutional:      General: She is not in acute distress.    Appearance: Normal appearance. She is well-developed. She is not ill-appearing.  HENT:     Head: Normocephalic and atraumatic.     Right Ear: Tympanic membrane, ear canal and external ear normal. There is no impacted cerumen.     Left Ear: Tympanic membrane, ear canal and external ear normal. There is no impacted cerumen.     Nose: Nose normal. No congestion or rhinorrhea.     Mouth/Throat:     Mouth: Mucous membranes  are moist.     Pharynx: Oropharynx is clear. No oropharyngeal exudate or posterior oropharyngeal erythema.  Eyes:     General:        Right eye: No discharge.        Left eye: No discharge.     Extraocular Movements: Extraocular movements intact.     Conjunctiva/sclera: Conjunctivae normal.     Pupils: Pupils are equal, round, and reactive to light.  Neck:     Thyroid: No thyromegaly.     Vascular: No carotid bruit.     Trachea: No tracheal deviation.  Cardiovascular:     Rate and Rhythm: Normal rate and regular rhythm.     Pulses: Normal pulses.     Heart sounds: Normal heart sounds. No murmur heard.   No friction rub. No gallop.  Pulmonary:     Effort: Pulmonary effort is normal. No respiratory distress.     Breath sounds: Normal breath sounds. No stridor. No wheezing, rhonchi or rales.  Chest:     Chest wall: No tenderness.  Abdominal:     General: Abdomen is flat. Bowel sounds are normal. There is no distension.     Palpations: Abdomen is soft. There is no mass.     Tenderness: There is no abdominal tenderness. There is no right CVA tenderness, left CVA tenderness, guarding or rebound.     Hernia: No hernia is present.  Musculoskeletal:        General: No swelling, tenderness, deformity or signs of injury. Normal range of motion.     Cervical back: Normal range of motion and neck supple.     Right lower leg: No edema.     Left lower leg: No edema.  Lymphadenopathy:      Cervical: No cervical adenopathy.  Skin:    General: Skin is warm and dry.     Coloration: Skin is not jaundiced or pale.     Findings: No bruising, erythema, lesion or rash.  Neurological:     General: No focal deficit present.     Mental Status: She is alert and oriented to person, place, and time.     Cranial Nerves: No cranial nerve deficit.     Sensory: No sensory deficit.     Motor: No weakness.     Coordination: Coordination normal.     Gait: Gait normal.     Deep Tendon Reflexes: Reflexes normal.  Psychiatric:        Mood and Affect: Mood normal.        Behavior: Behavior normal.        Thought Content: Thought content normal.        Judgment: Judgment normal.       Assessment & Plan:  1. Hypothyroidism, unspecified type - Consider increase in synthroid.  - CBC with Differential/Platelet; Future - Comprehensive metabolic panel; Future - Lipid panel; Future - TSH; Future - levothyroxine (SYNTHROID) 50 MCG tablet; Take 1 tablet (50 mcg total) by mouth daily before breakfast.  Dispense: 90 tablet; Refill: 3  2. Pure hypercholesterolemia - Consider statin  - CBC with Differential/Platelet; Future - Comprehensive metabolic panel; Future - Lipid panel; Future - TSH; Future  3. Melanoma of skin ([place]) - Follow up with   4. Vitamin D deficiency  - VITAMIN D 25 Hydroxy (Vit-D Deficiency, Fractures); Future  Dorothyann Peng, NP

## 2021-10-20 NOTE — Patient Instructions (Signed)
It was great seeing you today   We will follow up with you regarding your lab work   Please let me know if you need anything   

## 2021-11-17 ENCOUNTER — Ambulatory Visit: Payer: Medicare Other

## 2021-12-07 ENCOUNTER — Ambulatory Visit (INDEPENDENT_AMBULATORY_CARE_PROVIDER_SITE_OTHER): Payer: Medicare Other

## 2021-12-07 VITALS — BP 118/62 | HR 76 | Temp 96.2°F | Ht 65.0 in | Wt 129.2 lb

## 2021-12-07 DIAGNOSIS — Z0000 Encounter for general adult medical examination without abnormal findings: Secondary | ICD-10-CM | POA: Diagnosis not present

## 2021-12-07 NOTE — Progress Notes (Cosign Needed)
Subjective:   Natasha Norman is a 71 y.o. female who presents for Medicare Annual (Subsequent) preventive examination.  Review of Systems        Objective:    Today's Vitals   12/07/21 0948  BP: 118/62  Pulse: 76  Temp: (!) 96.2 F (35.7 C)  TempSrc: Oral  SpO2: 99%  Weight: 129 lb 3.2 oz (58.6 kg)  Height: '5\' 5"'$  (1.651 m)   Body mass index is 21.5 kg/m.  Advanced Directives 12/07/2021 02/18/2021 11/11/2020 01/24/2020 09/24/2019 02/02/2017  Does Patient Have a Medical Advance Directive? Yes Yes Yes Yes Yes Yes  Type of Paramedic of [place];Living will Living will Healthcare Power of [place];Living will Living will [place];Living will  Does patient want to make changes to medical advance directive? No - Patient declined No - Patient declined - No - Patient declined No - Patient declined No - Patient declined  Copy of [place] in Chart? - - Yes - validated most recent copy scanned in chart (See row information) Yes - validated most recent copy scanned in chart (See row information) - Yes    Current Medications (verified) Outpatient Encounter Medications as of 12/07/2021  Medication Sig   calcium carbonate (OS-CAL) 600 MG TABS tablet Take 600 mg by mouth 2 (two) times daily with a meal.   levothyroxine (SYNTHROID) 50 MCG tablet Take 1 tablet (50 mcg total) by mouth daily before breakfast.   loratadine (CLARITIN) 10 MG tablet Take 10 mg by mouth daily.   Multiple Vitamin (MULTIVITAMIN) tablet Take 1 tablet by mouth daily.   Multiple Vitamins-Minerals (PRESERVISION AREDS PO) Take by mouth.   VITAMIN D PO Take by mouth.   No facility-administered encounter medications on file as of 12/07/2021.    Allergies (verified) Patient has no known allergies.   History: Past Medical History:  Diagnosis Date   Cancer ([place]) 1996   Melanoma   Chicken pox    H/O repair of rotator cuff     Right    Hypothyroidism    Osteopenia    Seasonal allergies    Past Surgical History:  Procedure Laterality Date   BREAST BIOPSY Right 1984, 1996   Benign   lymph node resection  1998   Family History  Problem Relation Age of Onset   Cancer Mother        breast cancer   Breast cancer Mother    Heart disease Father    Diabetes Father    Heart failure Maternal Grandmother    Stroke Paternal Grandfather    Social History   Socioeconomic History   Marital status: Married    Spouse name: Not on file   Number of children: 3   Years of education: 4 years college   Highest education level: Bachelor's degree (e.g., BA, AB, BS)  Occupational History   Occupation: Retired  Tobacco Use   Smoking status: Never   Smokeless tobacco: Never  Substance and Sexual Activity   Alcohol use: Yes    Alcohol/week: 1.0 standard drink    Types: 1 Glasses of wine per week    Comment: Wine occasionally   Drug use: No   Sexual activity: Not on file  Other Topics Concern   Not on file  Social History Narrative   Retired [place]   Married + dogs   Moved from [place]    3 children   Social Determinants of Health  Financial Resource Strain: Low Risk    Difficulty of Paying Living Expenses: Not hard at all  Food Insecurity: No Food Insecurity   Worried About Charity fundraiser in the Last Year: Never true   Ran Out of Food in the Last Year: Never true  Transportation Needs: No Transportation Needs   Lack of Transportation (Medical): No   Lack of Transportation (Non-Medical): No  Physical Activity: Sufficiently Active   Days of Exercise per Week: 5 days   Minutes of Exercise per Session: 30 min  Stress: No Stress Concern Present   Feeling of Stress : Not at all  Social Connections: Moderately Integrated   Frequency of Communication with Friends and Family: More than three times a week   Frequency of Social Gatherings with Friends and Family: More than  three times a week   Attends Religious Services: Never   Marine scientist or Organizations: Yes   Attends Archivist Meetings: Never   Marital Status: Married    Tobacco Counseling Counseling given: Not Answered   Clinical Intake: How often do you need to have someone help you when you read instructions, pamphlets, or other written materials from your doctor or pharmacy?: 1 - Never  Diabetic?  No   Activities of Daily Living In your present state of health, do you have any difficulty performing the following activities: 12/07/2021  Hearing? N  Vision? N  Difficulty concentrating or making decisions? N  Walking or climbing stairs? N  Dressing or bathing? N  Doing errands, shopping? N  Preparing Food and eating ? N  Using the Toilet? N  In the past six months, have you accidently leaked urine? N  Do you have problems with loss of bowel control? N  Managing your Medications? N  Managing your Finances? N  Housekeeping or managing your Housekeeping? N  Some recent data might be hidden    Patient Care Team: Dorothyann Peng, NP as PCP - General (Family Medicine)  Indicate any recent Medical Services you may have received from other than [facility] providers in the past year (date may be approximate).     Assessment:   This is a routine wellness examination for [place].  Hearing/Vision screen Hearing Screening - Comments:: No hearing difficulty Vision Screening - Comments:: Wears glasses. Followed by Dr Prudencio Burly  Dietary issues and exercise activities discussed: Exercise limited by: None identified   Goals Addressed   None    Depression Screen PHQ 2/9 Scores 12/07/2021 10/20/2021 11/11/2020 09/24/2019 07/11/2018 02/17/2018  PHQ - 2 Score 0 0 0 0 0 0    Fall Risk Fall Risk  10/17/2021 11/11/2020 09/24/2019 08/20/2019 07/11/2018  Falls in the past year? 0 0 0 1 No  Comment - - - Emmi Telephone Survey: data to providers prior to load -  Number falls in past yr: - 0 -  1 -  Comment - - - Emmi Telephone Survey Actual Response = 1 -  Injury with Fall? - 0 - 0 -  Risk for fall due to : - Impaired vision - - -  Follow up - Falls prevention discussed - - -    FALL RISK PREVENTION PERTAINING TO THE HOME:  Any stairs in or around the home? Yes  If so, are there any without handrails? No  Home free of loose throw rugs in walkways, pet beds, electrical cords, etc? Yes  Adequate lighting in your home to reduce risk of falls? Yes   ASSISTIVE DEVICES UTILIZED TO  PREVENT FALLS:  Life alert? No  Use of a cane, walker or w/c? No  Grab bars in the bathroom? No  Shower chair or bench in shower? No  Elevated toilet seat or a handicapped toilet? Yes  TIMED UP AND GO:  Was the test performed? Yes .  Length of time to ambulate 10 feet: 5 sec.   Gait steady and fast without use of assistive device  Cognitive Function:     6CIT Screen 12/07/2021 11/11/2020 09/24/2019  What Year? 0 points 0 points 0 points  What month? 0 points 0 points 0 points  What time? 0 points - 0 points  Count back from 20 0 points 0 points 0 points  Months in reverse 0 points 0 points 0 points  Repeat phrase 0 points 0 points 0 points  Total Score 0 - 0    Immunizations Immunization History  Administered Date(s) Administered   Influenza, High Dose Seasonal PF 06/12/2018   Influenza-Unspecified 07/08/2020, 07/01/2021   PFIZER(Purple Top)SARS-COV-2 Vaccination 11/04/2019, 11/28/2019, 07/31/2020, 02/10/2021   Pfizer Covid-19 Vaccine Bivalent Booster 104yr & up 07/01/2021   Pneumococcal Conjugate-13 02/17/2018   Pneumococcal Polysaccharide-23 09/07/2019   Tdap 09/27/2009, 07/31/2020   Zoster Recombinat (Shingrix) 12/26/2019, 06/13/2020   Zoster, Live 09/27/2012    TDAP status: Up to date  Flu Vaccine status: Up to date  Pneumococcal vaccine status: Up to date  Covid-19 vaccine status: Completed vaccines  Qualifies for Shingles Vaccine? Yes   Zostavax completed Yes    Shingrix Completed?: Yes  Screening Tests Health Maintenance  Topic Date Due   COLONOSCOPY (Pts [phone] coverage will need to be confirmed)  09/27/2022   MAMMOGRAM  06/20/2023   TETANUS/TDAP  07/31/2030   Pneumonia Vaccine 6595Years old  Completed   INFLUENZA VACCINE  Completed   DEXA SCAN  Completed   COVID-19 Vaccine  Completed   Hepatitis C Screening  Completed   Zoster Vaccines- Shingrix  Completed   HPV VACCINES  Aged Out    Health Maintenance  There are no preventive care reminders to display for this patient.  Colorectal cancer screening: Type of screening: Colonoscopy. Completed 09/27/12. Repeat every 10 years  Mammogram status: Completed 06/19/21. Repeat every year  Bone Density status: Completed 12/11/19. Results reflect: Bone density results: OSTEOPENIA. Repeat every 5 years.  Lung Cancer Screening: (Low Dose CT Chest recommended if Age 70-80ears, 30 pack-year currently smoking OR have quit w/in 15years.) does not qualify.     Additional Screening:  Hepatitis C Screening: does qualify; Completed 02/02/17  Vision Screening: Recommended annual ophthalmology exams for early detection of glaucoma and other disorders of the eye. Is the patient up to date with their annual eye exam?  Yes  Who is the provider or what is the name of the office in which the patient attends annual eye exams? Dr LyPrudencio Burlyf pt is not established with a provider, would they like to be referred to a provider to establish care? No .   Dental Screening: Recommended annual dental exams for proper oral hygiene  Community Resource Referral / Chronic Care Management:  CRR required this visit?  No   CCM required this visit?  No      Plan:     I have personally reviewed and noted the following in the patients chart:   Medical and social history Use of alcohol, tobacco or illicit drugs  Current medications and supplements including opioid prescriptions.  Functional ability and  status Nutritional status Physical activity  Advanced directives List of other physicians Hospitalizations, surgeries, and ER visits in previous 12 months Vitals Screenings to include cognitive, depression, and falls Referrals and appointments  In addition, I have reviewed and discussed with patient certain preventive protocols, quality metrics, and best practice recommendations. A written personalized care plan for preventive services as well as general preventive health recommendations were provided to patient.     Criselda Peaches, LPN   [id number]   Nurse Notes: None

## 2021-12-07 NOTE — Patient Instructions (Addendum)
Natasha Norman , Thank you for taking time to come for your Medicare Wellness Visit. I appreciate your ongoing commitment to your health goals. Please review the following plan we discussed and let me know if I can assist you in the future.   These are the goals we discussed:  Goals      Patient Stated     Decrease cholesterol     Patient Stated     None at this time         This is a list of the screening recommended for you and due dates:  Health Maintenance  Topic Date Due   Colon Cancer Screening  09/27/2022   Mammogram  06/20/2023   Tetanus Vaccine  07/31/2030   Pneumonia Vaccine  Completed   Flu Shot  Completed   DEXA scan (bone density measurement)  Completed   COVID-19 Vaccine  Completed   Hepatitis C Screening: USPSTF Recommendation to screen - Ages 74-79 yo.  Completed   Zoster (Shingles) Vaccine  Completed   HPV Vaccine  Aged Out     Advanced directives: Yes Copies on file  Conditions/risks identified: None  Next appointment: Follow up in one year for your annual wellness visit    Preventive Care 65 Years and Older, Female Preventive care refers to lifestyle choices and visits with your health care provider that can promote health and wellness. What does preventive care include? A yearly physical exam. This is also called an annual well check. Dental exams once or twice a year. Routine eye exams. Ask your health care provider how often you should have your eyes checked. Personal lifestyle choices, including: Daily care of your teeth and gums. Regular physical activity. Eating a healthy diet. Avoiding tobacco and drug use. Limiting alcohol use. Practicing safe sex. Taking low-dose aspirin every day. Taking vitamin and mineral supplements as recommended by your health care provider. What happens during an annual well check? The services and screenings done by your health care provider during your annual well check will depend on your age, overall health,  lifestyle risk factors, and family history of disease. Counseling  Your health care provider may ask you questions about your: Alcohol use. Tobacco use. Drug use. Emotional well-being. Home and relationship well-being. Sexual activity. Eating habits. History of falls. Memory and ability to understand (cognition). Work and work Statistician. Reproductive health. Screening  You may have the following tests or measurements: Height, weight, and BMI. Blood pressure. Lipid and cholesterol levels. These may be checked every 5 years, or more frequently if you are over 68 years old. Skin check. Lung cancer screening. You may have this screening every year starting at age 69 if you have a 30-pack-year history of smoking and currently smoke or have quit within the past 15 years. Fecal occult blood test (FOBT) of the stool. You may have this test every year starting at age 19. Flexible sigmoidoscopy or colonoscopy. You may have a sigmoidoscopy every 5 years or a colonoscopy every 10 years starting at age 81. Hepatitis C blood test. Hepatitis B blood test. Sexually transmitted disease (STD) testing. Diabetes screening. This is done by checking your blood sugar (glucose) after you have not eaten for a while (fasting). You may have this done every 1-3 years. Bone density scan. This is done to screen for osteoporosis. You may have this done starting at age 40. Mammogram. This may be done every 1-2 years. Talk to your health care provider about how often you should have regular mammograms.  Talk with your health care provider about your test results, treatment options, and if necessary, the need for more tests. Vaccines  Your health care provider may recommend certain vaccines, such as: Influenza vaccine. This is recommended every year. Tetanus, diphtheria, and acellular pertussis (Tdap, Td) vaccine. You may need a Td booster every 10 years. Zoster vaccine. You may need this after age  25. Pneumococcal 13-valent conjugate (PCV13) vaccine. One dose is recommended after age 13. Pneumococcal polysaccharide (PPSV23) vaccine. One dose is recommended after age 38. Talk to your health care provider about which screenings and vaccines you need and how often you need them. This information is not intended to replace advice given to you by your health care provider. Make sure you discuss any questions you have with your health care provider. Document Released: 10/10/2015 Document Revised: 06/02/2016 Document Reviewed: 07/15/2015 Elsevier Interactive Patient Education  2017 [place] Prevention in the Home Falls can cause injuries. They can happen to people of all ages. There are many things you can do to make your home safe and to help prevent falls. What can I do on the outside of my home? Regularly fix the edges of walkways and driveways and fix any cracks. Remove anything that might make you trip as you walk through a door, such as a raised step or threshold. Trim any bushes or trees on the path to your home. Use bright outdoor lighting. Clear any walking paths of anything that might make someone trip, such as rocks or tools. Regularly check to see if handrails are loose or broken. Make sure that both sides of any steps have handrails. Any raised decks and porches should have guardrails on the edges. Have any leaves, snow, or ice cleared regularly. Use sand or salt on walking paths during winter. Clean up any spills in your garage right away. This includes oil or grease spills. What can I do in the bathroom? Use night lights. Install grab bars by the toilet and in the tub and shower. Do not use towel bars as grab bars. Use non-skid mats or decals in the tub or shower. If you need to sit down in the shower, use a plastic, non-slip stool. Keep the floor dry. Clean up any water that spills on the floor as soon as it happens. Remove soap buildup in the tub or shower  regularly. Attach bath mats securely with double-sided non-slip rug tape. Do not have throw rugs and other things on the floor that can make you trip. What can I do in the bedroom? Use night lights. Make sure that you have a light by your bed that is easy to reach. Do not use any sheets or blankets that are too big for your bed. They should not hang down onto the floor. Have a firm chair that has side arms. You can use this for support while you get dressed. Do not have throw rugs and other things on the floor that can make you trip. What can I do in the kitchen? Clean up any spills right away. Avoid walking on wet floors. Keep items that you use a lot in easy-to-reach places. If you need to reach something above you, use a strong step stool that has a grab bar. Keep electrical cords out of the way. Do not use floor polish or wax that makes floors slippery. If you must use wax, use non-skid floor wax. Do not have throw rugs and other things on the floor that can make  you trip. What can I do with my stairs? Do not leave any items on the stairs. Make sure that there are handrails on both sides of the stairs and use them. Fix handrails that are broken or loose. Make sure that handrails are as long as the stairways. Check any carpeting to make sure that it is firmly attached to the stairs. Fix any carpet that is loose or worn. Avoid having throw rugs at the top or bottom of the stairs. If you do have throw rugs, attach them to the floor with carpet tape. Make sure that you have a light switch at the top of the stairs and the bottom of the stairs. If you do not have them, ask someone to add them for you. What else can I do to help prevent falls? Wear shoes that: Do not have high heels. Have rubber bottoms. Are comfortable and fit you well. Are closed at the toe. Do not wear sandals. If you use a stepladder: Make sure that it is fully opened. Do not climb a closed stepladder. Make sure that  both sides of the stepladder are locked into place. Ask someone to hold it for you, if possible. Clearly mark and make sure that you can see: Any grab bars or handrails. First and last steps. Where the edge of each step is. Use tools that help you move around (mobility aids) if they are needed. These include: Canes. Walkers. Scooters. Crutches. Turn on the lights when you go into a dark area. Replace any light bulbs as soon as they burn out. Set up your furniture so you have a clear path. Avoid moving your furniture around. If any of your floors are uneven, fix them. If there are any pets around you, be aware of where they are. Review your medicines with your doctor. Some medicines can make you feel dizzy. This can increase your chance of falling. Ask your doctor what other things that you can do to help prevent falls. This information is not intended to replace advice given to you by your health care provider. Make sure you discuss any questions you have with your health care provider. Document Released: 07/10/2009 Document Revised: 02/19/2016 Document Reviewed: 10/18/2014 Elsevier Interactive Patient Education  2017 [employer].

## 2022-01-08 DIAGNOSIS — Z09 Encounter for follow-up examination after completed treatment for conditions other than malignant neoplasm: Secondary | ICD-10-CM | POA: Diagnosis not present

## 2022-01-08 DIAGNOSIS — Z08 Encounter for follow-up examination after completed treatment for malignant neoplasm: Secondary | ICD-10-CM | POA: Diagnosis not present

## 2022-01-08 DIAGNOSIS — C4362 Malignant melanoma of left upper limb, including shoulder: Secondary | ICD-10-CM | POA: Diagnosis not present

## 2022-01-08 DIAGNOSIS — D2372 Other benign neoplasm of skin of left lower limb, including hip: Secondary | ICD-10-CM | POA: Diagnosis not present

## 2022-01-08 DIAGNOSIS — Z85820 Personal history of malignant melanoma of skin: Secondary | ICD-10-CM | POA: Diagnosis not present

## 2022-01-08 DIAGNOSIS — Z872 Personal history of diseases of the skin and subcutaneous tissue: Secondary | ICD-10-CM | POA: Diagnosis not present

## 2022-01-08 DIAGNOSIS — L821 Other seborrheic keratosis: Secondary | ICD-10-CM | POA: Diagnosis not present

## 2022-01-08 DIAGNOSIS — L814 Other melanin hyperpigmentation: Secondary | ICD-10-CM | POA: Diagnosis not present

## 2022-01-08 DIAGNOSIS — L2089 Other atopic dermatitis: Secondary | ICD-10-CM | POA: Diagnosis not present

## 2022-01-08 DIAGNOSIS — Z86006 Personal history of melanoma in-situ: Secondary | ICD-10-CM | POA: Diagnosis not present

## 2022-01-08 DIAGNOSIS — L218 Other seborrheic dermatitis: Secondary | ICD-10-CM | POA: Diagnosis not present

## 2022-04-08 ENCOUNTER — Ambulatory Visit (INDEPENDENT_AMBULATORY_CARE_PROVIDER_SITE_OTHER): Payer: Medicare Other | Admitting: Family Medicine

## 2022-04-08 VITALS — BP 112/74 | HR 85 | Ht 65.0 in | Wt 126.0 lb

## 2022-04-08 DIAGNOSIS — M7062 Trochanteric bursitis, left hip: Secondary | ICD-10-CM

## 2022-04-08 DIAGNOSIS — H2513 Age-related nuclear cataract, bilateral: Secondary | ICD-10-CM | POA: Diagnosis not present

## 2022-04-08 DIAGNOSIS — H5213 Myopia, bilateral: Secondary | ICD-10-CM | POA: Diagnosis not present

## 2022-04-08 DIAGNOSIS — H353132 Nonexudative age-related macular degeneration, bilateral, intermediate dry stage: Secondary | ICD-10-CM | POA: Diagnosis not present

## 2022-04-08 DIAGNOSIS — H524 Presbyopia: Secondary | ICD-10-CM | POA: Diagnosis not present

## 2022-04-08 NOTE — Patient Instructions (Addendum)
Thank you for coming in today.   Read about extra corporeal shockwave therapy (ECSWT) treatments for hip bursitis or hip abductor tendonitis.   Happy to schedule this

## 2022-04-08 NOTE — Progress Notes (Signed)
I, Wendy Poet, LAT, ATC, am serving as scribe for Dr. [name].  Natasha Norman is a 71 y.o. female who presents to [place] at [facility] today for f/u f/u of L hip pain due to mild hip arthritis, a small labrum tear, hip ABD tendinopathy/tear, bursitis, and hamstring tendinitis.  She was last seen by Dr. Georgina Snell on 09/22/21 for PRP to her L hip gluteus medius.  Prior to that she had multiple interventions including a prior L hip joint injection on 04/07/21, a L GT injection on 12/15/20 and 12 PT visits.  She has been shown a HEP by PT.  She has also seen Dr. Aretha Parrot for consultation.  Today, pt reports L hip is somewhat improved, but still painful. Pt locates pain to the lateral aspect of the L hip.   Dx imaging: 05/25/21 L hip MRI             04/07/21 L hip XR             04/13/20 L hip MRI             12/14/19 L-hip XR  Pertinent review of systems: No fevers or chills  Relevant historical information: Hypothyroidism.   Exam:  BP 112/74   Pulse 85   Ht '5\' 5"'$  (1.651 m)   Wt 126 lb (57.2 kg)   SpO2 98%   BMI 20.97 kg/m  General: Well Developed, well nourished, and in no acute distress.   MSK: Left hip: Normal. Tender to palpation greater trochanter. Hip abduction strength and external rotation strength mildly diminished 4/5 with pain.    Lab and Radiology Results EXAM: MRI OF THE LEFT HIP WITH CONTRAST (MR Arthrogram)   TECHNIQUE: Multiplanar, multisequence MR imaging of the hip was performed immediately following contrast injection into the hip joint under fluoroscopic guidance. No intravenous contrast was administered.   COMPARISON:  X-ray 04/07/2021, MRI 04/13/2020   FINDINGS: Bones: No acute fracture. No dislocation. No femoral head avascular necrosis. Bony pelvis intact without diastasis. Mild arthropathy of the SI joints and pubic symphysis. No bone marrow edema. No marrow replacing bone lesion.   Articular cartilage and labrum    Articular cartilage:  Mild chondral thinning without focal defect.   Labrum: Small nondisplaced anterosuperior labral tear (series 8, image 12). No paralabral cyst.   Joint or bursal effusion   Joint effusion: Well distended with injected contrast. A small amount of extracapsular contrast extends into the anterior soft tissues and is likely related to injection.   Bursae: Left peritrochanteric bursal edema.   Muscles and tendons   Muscles and tendons: Tendinosis and low-grade insertional tearing of the gluteus medius tendon. Moderate tendinosis of the distal gluteus minimus tendon. Mild tendinosis of the left hamstring tendon origin. Remaining tendinous structures about the left hip are intact. Normal muscle bulk and signal intensity without edema, atrophy, or fatty infiltration.   Other findings   Miscellaneous: No inguinal lymphadenopathy. Stable 2.5 cm simple appearing cyst is again seen within the left adnexa. No follow-up imaging recommended. Note: This recommendation does not apply to premenarchal patients and to those with increased risk (genetic, family history, elevated tumor markers or other high-risk factors) of ovarian cancer. Reference: JACR 2020 Feb; 17(2):248-254   IMPRESSION: 1. Mild left hip osteoarthritis. Small nondisplaced anterosuperior labral tear. 2. Tendinosis and low-grade insertional tearing of the gluteus medius tendon. Moderate tendinosis of the distal gluteus minimus tendon. Mild left peritrochanteric bursitis 3. Mild tendinosis of the left  hamstring tendon origin.     Electronically Signed   By: Davina Poke D.O.   On: 05/25/2021 16:27  I, [name], personally (independently) visualized and performed the interpretation of the images attached in this note.     Assessment and Plan: 71 y.o. female with left hip abductor tendinopathy.  Improved with PRP but not fully.  We spent time talking about her current symptoms.  Would  recommend shockwave therapy as next step or repeat PRP.  She will think about it and let me know.    Discussed warning signs or symptoms. Please see discharge instructions. Patient expresses understanding.   The above documentation has been reviewed and is accurate and complete [name], M.D.

## 2022-05-11 DIAGNOSIS — Z85820 Personal history of malignant melanoma of skin: Secondary | ICD-10-CM | POA: Diagnosis not present

## 2022-05-11 DIAGNOSIS — D225 Melanocytic nevi of trunk: Secondary | ICD-10-CM | POA: Diagnosis not present

## 2022-05-11 DIAGNOSIS — L814 Other melanin hyperpigmentation: Secondary | ICD-10-CM | POA: Diagnosis not present

## 2022-05-11 DIAGNOSIS — L821 Other seborrheic keratosis: Secondary | ICD-10-CM | POA: Diagnosis not present

## 2022-05-11 DIAGNOSIS — Z09 Encounter for follow-up examination after completed treatment for conditions other than malignant neoplasm: Secondary | ICD-10-CM | POA: Diagnosis not present

## 2022-05-11 DIAGNOSIS — Z08 Encounter for follow-up examination after completed treatment for malignant neoplasm: Secondary | ICD-10-CM | POA: Diagnosis not present

## 2022-05-11 DIAGNOSIS — Z872 Personal history of diseases of the skin and subcutaneous tissue: Secondary | ICD-10-CM | POA: Diagnosis not present

## 2022-06-16 ENCOUNTER — Other Ambulatory Visit: Payer: Self-pay | Admitting: Adult Health

## 2022-06-16 DIAGNOSIS — Z1231 Encounter for screening mammogram for malignant neoplasm of breast: Secondary | ICD-10-CM

## 2022-06-28 DIAGNOSIS — Z23 Encounter for immunization: Secondary | ICD-10-CM | POA: Diagnosis not present

## 2022-07-09 ENCOUNTER — Ambulatory Visit
Admission: RE | Admit: 2022-07-09 | Discharge: 2022-07-09 | Disposition: A | Discharge status: Home / Self Care | Payer: Medicare Other | Source: Ambulatory Visit | Attending: Adult Health | Admitting: Adult Health

## 2022-07-09 DIAGNOSIS — Z1231 Encounter for screening mammogram for malignant neoplasm of breast: Secondary | ICD-10-CM

## 2022-07-21 ENCOUNTER — Ambulatory Visit (INDEPENDENT_AMBULATORY_CARE_PROVIDER_SITE_OTHER): Payer: Self-pay | Admitting: Family Medicine

## 2022-07-21 DIAGNOSIS — M7062 Trochanteric bursitis, left hip: Secondary | ICD-10-CM

## 2022-07-21 NOTE — Progress Notes (Signed)
   [facility] [place] Phone: [phone]   Extracorporeal Shockwave Therapy Note    Patient is being treated today with ECSWT. Informed consent was obtained and patient tolerated procedure well.   Therapy performed by [name]  Condition treated: Trochanteric bursitis Treatment preset used: Trochanteric bursitis Energy used: 90 mJ Frequency used: 10 Hz Number of pulses: 2000 Treatment #1 of #4  Electronically signed by:  [facility] 10:34 AM 07/21/22

## 2022-07-21 NOTE — Patient Instructions (Signed)
Thank you for coming in today.   Recheck in 1 week for shockwave #2

## 2022-07-28 ENCOUNTER — Ambulatory Visit (INDEPENDENT_AMBULATORY_CARE_PROVIDER_SITE_OTHER): Payer: Self-pay | Admitting: Family Medicine

## 2022-07-28 DIAGNOSIS — M7062 Trochanteric bursitis, left hip: Secondary | ICD-10-CM

## 2022-07-28 NOTE — Progress Notes (Signed)
   Natasha [facility] Phone: [phone]   Extracorporeal Shockwave Therapy Note    Patient is being treated today with ECSWT. Informed consent was obtained and patient tolerated procedure well.   Therapy performed by [name]  Condition treated: Trochanteric bursitis left Treatment preset used: Trochanteric bursitis Energy used: 90 mJ Frequency used: 13 Hz Number of pulses: 2500 Treatment #2 of #4  Electronically signed by:  Natasha Norman Sports [facility] 10:07 AM 07/28/22

## 2022-08-04 ENCOUNTER — Ambulatory Visit (INDEPENDENT_AMBULATORY_CARE_PROVIDER_SITE_OTHER): Payer: Self-pay | Admitting: Family Medicine

## 2022-08-04 DIAGNOSIS — M7062 Trochanteric bursitis, left hip: Secondary | ICD-10-CM

## 2022-08-04 NOTE — Progress Notes (Signed)
   Natasha [facility] Phone: [phone]   Extracorporeal Shockwave Therapy Note    Patient is being treated today with ECSWT. Informed consent was obtained and patient tolerated procedure well.   Therapy performed by [name]  Condition treated: Left trochanteric bursitis Treatment preset used: Trochanteric bursitis Energy used: 90 mJ Frequency used: 13 Hz Number of pulses: 2500 Treatment #3 of #4  Electronically signed by:  Natasha Norman Sports Medicine 10:28 AM 08/04/22

## 2022-08-11 ENCOUNTER — Ambulatory Visit (INDEPENDENT_AMBULATORY_CARE_PROVIDER_SITE_OTHER): Payer: Self-pay | Admitting: Family Medicine

## 2022-08-11 DIAGNOSIS — M7062 Trochanteric bursitis, left hip: Secondary | ICD-10-CM

## 2022-08-11 NOTE — Progress Notes (Signed)
   Larinda Buttery [facility] [place] Phone: [phone]   Extracorporeal Shockwave Therapy Note    Patient is being treated today with ECSWT. Informed consent was obtained and patient tolerated procedure well.   Therapy performed by [name]  Condition treated: Left trochanteric bursitis Treatment preset used: Trochanteric bursitis Energy used: 90 mJ Frequency used: 10 Hz Number of pulses: 2500 Treatment #4 of #4  Electronically signed by:  Larinda Buttery Sports Medicine 11:46 AM 08/11/22

## 2022-08-11 NOTE — Patient Instructions (Signed)
Thank you for coming in today.   We can recheck for injection in early December for your [place] trip.

## 2022-08-25 ENCOUNTER — Encounter: Payer: Self-pay | Admitting: Adult Health

## 2022-08-26 NOTE — Progress Notes (Signed)
   I, Peterson Lombard, LAT, ATC acting as a scribe for [name], MD.  Natasha Norman is a 71 y.o. female who presents to [facility] today for f/u L hip pain. Pt was last seen by Dr. Georgina Snell on 08/11/22 for her 4th ECSW therapy treatment. Pt had a L hip gluteus medius insertion at the [facility] injection on 09/22/21. Today, pt reports L hip is feeling somewhat better. Pt started using her daughter's massage gun and thinks this has also been helpful. She is leaving for [place] on Sunday.  Dx imaging: 05/25/21 L hip MRI  04/07/21 L hip XR  04/13/20 L hip MRI  12/14/19 L hip XR  Pertinent review of systems: No fevers or chills  Relevant historical information: Chronic hip pain    Exam:  BP 116/80   Pulse 91   Ht '5\' 5"'$  (1.651 m)   Wt 126 lb (57.2 kg)   SpO2 100%   BMI 20.97 kg/m  General: Well Developed, well nourished, and in no acute distress.   MSK: Left hip normal motion     Assessment and Plan: 71 y.o. female with chronic left hip pain improved with conservative management treatment including shockwave therapy.  Pain is not quite bad enough to do an injection today.  She is leaving for trip to [place] this weekend.  Will prescribe prednisone to use if worsening.  Check back with me as needed.   PDMP not reviewed this encounter. No orders of the defined types were placed in this encounter.  Meds ordered this encounter  Medications   predniSONE (DELTASONE) 50 MG tablet    Sig: Take 1 tablet (50 mg total) by mouth daily. Take the prednsone if your hip pain does not improve.    Dispense:  5 tablet    Refill:  0     Discussed warning signs or symptoms. Please see discharge instructions. Patient expresses understanding.  The above documentation has been reviewed and is accurate and complete [name], M.D.  Total encounter time 20 minutes including face-to-face time with the patient and, reviewing past medical record, and  charting on the date of service.

## 2022-08-27 ENCOUNTER — Ambulatory Visit (INDEPENDENT_AMBULATORY_CARE_PROVIDER_SITE_OTHER): Payer: Medicare Other | Admitting: Family Medicine

## 2022-08-27 VITALS — BP 116/80 | HR 91 | Ht 65.0 in | Wt 126.0 lb

## 2022-08-27 DIAGNOSIS — M7062 Trochanteric bursitis, left hip: Secondary | ICD-10-CM

## 2022-08-27 MED ORDER — PREDNISONE 50 MG PO TABS: 50.0000 mg | ORAL_TABLET | Freq: Every day | ORAL | 0 refills | Status: DC

## 2022-08-27 NOTE — Patient Instructions (Signed)
Thank you for coming in today.   Take the prednisone if your symptoms worsen at [facility] and not controllable with aleve and tylenol.  Bring your Paediatric nurse with you.   Let me know how it goes.

## 2022-10-05 ENCOUNTER — Ambulatory Visit (INDEPENDENT_AMBULATORY_CARE_PROVIDER_SITE_OTHER): Payer: Medicare Other | Admitting: Adult Health

## 2022-10-05 ENCOUNTER — Ambulatory Visit (INDEPENDENT_AMBULATORY_CARE_PROVIDER_SITE_OTHER): Payer: Medicare Other

## 2022-10-05 VITALS — BP 120/68 | HR 91 | Temp 97.6°F | Ht 65.0 in | Wt 121.0 lb

## 2022-10-05 DIAGNOSIS — M5412 Radiculopathy, cervical region: Secondary | ICD-10-CM

## 2022-10-05 DIAGNOSIS — E039 Hypothyroidism, unspecified: Secondary | ICD-10-CM | POA: Diagnosis not present

## 2022-10-05 DIAGNOSIS — E7800 Pure hypercholesterolemia, unspecified: Secondary | ICD-10-CM | POA: Diagnosis not present

## 2022-10-05 LAB — LIPID PANEL
Cholesterol: 211 mg/dL — ABNORMAL HIGH (ref 0–200)
HDL: 109.3 mg/dL (ref >39.00)
LDL Cholesterol: 86 mg/dL (ref 0–99)
NonHDL: 101.4
Total CHOL/HDL Ratio: 2
Triglycerides: 79 mg/dL (ref 0.0–149.0)
VLDL: 15.8 mg/dL (ref 0.0–40.0)

## 2022-10-05 LAB — TSH: TSH: 2.29 u[IU]/mL (ref 0.35–5.50)

## 2022-10-05 NOTE — Progress Notes (Signed)
Subjective:    Patient ID: Natasha Norman, female    DOB: 1951-04-22, 72 y.o.   MRN: [id number]  HPI  72 year old female who  has a past medical history of Cancer ([place]) (1996), Chicken pox, H/O repair of rotator cuff, Hypothyroidism, Osteopenia, and Seasonal allergies.  She presents to the office today for follow-up regarding hypothyroidism and hyperlipidemia.  She has a history of hypothyroidism for which she takes Synthroid 50 mcg daily.  He denies any symptoms of hypo or hyperthyroidism. Lab Results  Component Value Date   TSH 2.43 10/20/2021   Hyperlipidemia -she has a history of mildly elevated total cholesterol.  She has refused statins in the past and instead wanted to work on lifestyle modifications.  She would like to recheck her blood panel today Lab Results  Component Value Date   CHOL 203 (H) 10/20/2021   HDL 96.40 10/20/2021   LDLCALC 89 10/20/2021   TRIG 87.0 10/20/2021   CHOLHDL 2 10/20/2021   Additionally, she report that over the last month or so she has been having periodic episodes of numbness and tingling in both arms. Her first episode was in December of this year when she was at [facility] and was carrying around a back pack. Over the last week or so she has had 2-3 other episodes where she had numbness and tingling down both arms with a feeling of weakness in both arms. These episodes have been lasting 30 seconds or longer. No chest pain, shortness of breath, dizziness, lightheadedness, blurred vision, or syncopal episodes.   Review of Systems See HPI   Past Medical History:  Diagnosis Date   Cancer ([place]) 1996   Melanoma   Chicken pox    H/O repair of rotator cuff    Right    Hypothyroidism    Osteopenia    Seasonal allergies     Social History   Socioeconomic History   Marital status: Married    Spouse name: Not on file   Number of children: 3   Years of education: 4 years college   Highest education level: Bachelor's degree (e.g., BA,  AB, BS)  Occupational History   Occupation: Retired  Tobacco Use   Smoking status: Never   Smokeless tobacco: Never  Substance and Sexual Activity   Alcohol use: Yes    Alcohol/week: 1.0 standard drink of alcohol    Types: 1 Glasses of wine per week    Comment: Wine occasionally   Drug use: No   Sexual activity: Not on file  Other Topics Concern   Not on file  Social History Narrative   Retired [place]   Married + dogs   Moved from [place]    3 children   Social Determinants of Health   Financial Resource Strain: [place]  (12/07/2021)   Overall Financial Resource Strain (CARDIA)    Difficulty of Paying Living Expenses: Not hard at all  Food Insecurity: No Food Insecurity (12/07/2021)   Hunger Vital Sign    Worried About Running Out of Food in the Last Year: Never true    [place] in the Last Year: Never true  Transportation Needs: No Transportation Needs (12/07/2021)   PRAPARE - Hydrologist (Medical): No    Lack of Transportation (Non-Medical): No  Physical Activity: Sufficiently Active (12/07/2021)   Exercise Vital Sign    Days of Exercise per Week: 5 days    Minutes of Exercise  per Session: 30 min  Stress: No Stress Concern Present (12/07/2021)   [place]    Feeling of Stress : Not at all  Social Connections: Moderately Integrated (12/07/2021)   Social Connection and Isolation Panel [NHANES]    Frequency of Communication with Friends and Family: More than three times a week    Frequency of Social Gatherings with Friends and Family: More than three times a week    Attends Religious Services: Never    Marine scientist or Organizations: Yes    Attends Archivist Meetings: Never    Marital Status: Married  Human resources officer Violence: Not At Risk (12/07/2021)   Humiliation, Afraid, Rape, and Kick questionnaire    Fear of  Current or Ex-Partner: No    Emotionally Abused: No    Physically Abused: No    Sexually Abused: No    Past Surgical History:  Procedure Laterality Date   BREAST BIOPSY Right 1984, 1996   Benign   lymph node resection  1998    Family History  Problem Relation Age of Onset   Cancer Mother        breast cancer   Breast cancer Mother    Heart disease Father    Diabetes Father    Heart failure Maternal Grandmother    Stroke Paternal Grandfather     No Known Allergies  Current Outpatient Medications on File Prior to Visit  Medication Sig Dispense Refill   calcium carbonate (OS-CAL) 600 MG TABS tablet Take 600 mg by mouth 2 (two) times daily with a meal.     levothyroxine (SYNTHROID) 50 MCG tablet Take 1 tablet (50 mcg total) by mouth daily before breakfast. 90 tablet 3   loratadine (CLARITIN) 10 MG tablet Take 10 mg by mouth daily.     Multiple Vitamin (MULTIVITAMIN) tablet Take 1 tablet by mouth daily.     Multiple Vitamins-Minerals (PRESERVISION AREDS PO) Take by mouth.     VITAMIN D PO Take by mouth.     No current facility-administered medications on file prior to visit.    BP 120/68   Pulse 91   Temp 97.6 F (36.4 C) (Oral)   Ht '5\' 5"'$  (1.651 m)   Wt 121 lb (54.9 kg)   SpO2 99%   BMI 20.14 kg/m       Objective:   Physical Exam Vitals and nursing note reviewed.  Constitutional:      Appearance: Normal appearance.  Cardiovascular:     Rate and Rhythm: Normal rate and regular rhythm.     Pulses: Normal pulses.     Heart sounds: Normal heart sounds.  Pulmonary:     Breath sounds: Normal breath sounds.  Musculoskeletal:        General: Normal range of motion.  Skin:    General: Skin is warm and dry.  Neurological:     General: No focal deficit present.     Mental Status: She is alert and oriented to person, place, and time.  Psychiatric:        Mood and Affect: Mood normal.        Behavior: Behavior normal.        Thought Content: Thought content  normal.       Assessment & Plan:  1. Hypothyroidism, unspecified type - Consider increase in synthroid  - TSH; Future - TSH  2. Pure hypercholesterolemia - Consider adding statin  - Lipid panel; Future - Lipid panel  3. Cervical radiculopathy - Will do xray today. Consider MRI in the future . Advised stretching exercises. Does not want PT at this time  - DG Cervical Spine Complete; Future  Dorothyann Peng, NP

## 2022-10-06 ENCOUNTER — Other Ambulatory Visit: Payer: Self-pay | Admitting: Adult Health

## 2022-10-06 ENCOUNTER — Ambulatory Visit: Payer: Medicare Other | Admitting: Adult Health

## 2022-10-06 DIAGNOSIS — E039 Hypothyroidism, unspecified: Secondary | ICD-10-CM

## 2022-10-06 MED ORDER — LEVOTHYROXINE SODIUM 50 MCG PO TABS: 50.0000 ug | ORAL_TABLET | Freq: Every day | ORAL | 3 refills | Status: DC

## 2022-10-07 ENCOUNTER — Other Ambulatory Visit: Payer: Self-pay

## 2022-10-07 MED ORDER — ATORVASTATIN CALCIUM 10 MG PO TABS: 10.0000 mg | ORAL_TABLET | Freq: Every day | ORAL | 3 refills | Status: DC

## 2022-12-10 ENCOUNTER — Ambulatory Visit (INDEPENDENT_AMBULATORY_CARE_PROVIDER_SITE_OTHER): Payer: Medicare Other

## 2022-12-10 VITALS — BP 118/62 | HR 78 | Temp 97.8°F | Ht 65.0 in | Wt 127.3 lb

## 2022-12-10 DIAGNOSIS — Z0000 Encounter for general adult medical examination without abnormal findings: Secondary | ICD-10-CM | POA: Diagnosis not present

## 2022-12-10 NOTE — Patient Instructions (Addendum)
Natasha Norman , Thank you for taking time to come for your Medicare Wellness Visit. I appreciate your ongoing commitment to your health goals. Please review the following plan we discussed and let me know if I can assist you in the future.   These are the goals we discussed:  Goals       Patient Stated      Decrease cholesterol      Patient Stated      None at this time       Stay Healthy (pt-stated)        This is a list of the screening recommended for you and due dates:  Health Maintenance  Topic Date Due   COVID-19 Vaccine (7 - 2023-24 season) 12/26/2022*   Colon Cancer Screening  12/10/2023*   Medicare Annual Wellness Visit  12/10/2023   Mammogram  07/09/2024   DTaP/Tdap/Td vaccine (3 - Td or Tdap) 07/31/2030   Pneumonia Vaccine  Completed   Flu Shot  Completed   DEXA scan (bone density measurement)  Completed   Hepatitis C Screening: USPSTF Recommendation to screen - Ages 106-79 yo.  Completed   Zoster (Shingles) Vaccine  Completed   HPV Vaccine  Aged Out  *Topic was postponed. The date shown is not the original due date.    Advanced directives: In chart  Conditions/risks identified: None  Next appointment: Follow up in one year for your annual wellness visit    Preventive Care 65 Years and Older, Female Preventive care refers to lifestyle choices and visits with your health care provider that can promote health and wellness. What does preventive care include? A yearly physical exam. This is also called an annual well check. Dental exams once or twice a year. Routine eye exams. Ask your health care provider how often you should have your eyes checked. Personal lifestyle choices, including: Daily care of your teeth and gums. Regular physical activity. Eating a healthy diet. Avoiding tobacco and drug use. Limiting alcohol use. Practicing safe sex. Taking low-dose aspirin every day. Taking vitamin and mineral supplements as recommended by your health care  provider. What happens during an annual well check? The services and screenings done by your health care provider during your annual well check will depend on your age, overall health, lifestyle risk factors, and family history of disease. Counseling  Your health care provider may ask you questions about your: Alcohol use. Tobacco use. Drug use. Emotional well-being. Home and relationship well-being. Sexual activity. Eating habits. History of falls. Memory and ability to understand (cognition). Work and work Statistician. Reproductive health. Screening  You may have the following tests or measurements: Height, weight, and BMI. Blood pressure. Lipid and cholesterol levels. These may be checked every 5 years, or more frequently if you are over 67 years old. Skin check. Lung cancer screening. You may have this screening every year starting at age 79 if you have a 30-pack-year history of smoking and currently smoke or have quit within the past 15 years. Fecal occult blood test (FOBT) of the stool. You may have this test every year starting at age 22. Flexible sigmoidoscopy or colonoscopy. You may have a sigmoidoscopy every 5 years or a colonoscopy every 10 years starting at age 15. Hepatitis C blood test. Hepatitis B blood test. Sexually transmitted disease (STD) testing. Diabetes screening. This is done by checking your blood sugar (glucose) after you have not eaten for a while (fasting). You may have this done every 1-3 years. Bone density scan. This  is done to screen for osteoporosis. You may have this done starting at age 87. Mammogram. This may be done every 1-2 years. Talk to your health care provider about how often you should have regular mammograms. Talk with your health care provider about your test results, treatment options, and if necessary, the need for more tests. Vaccines  Your health care provider may recommend certain vaccines, such as: Influenza vaccine. This is  recommended every year. Tetanus, diphtheria, and acellular pertussis (Tdap, Td) vaccine. You may need a Td booster every 10 years. Zoster vaccine. You may need this after age 71. Pneumococcal 13-valent conjugate (PCV13) vaccine. One dose is recommended after age 63. Pneumococcal polysaccharide (PPSV23) vaccine. One dose is recommended after age 26. Talk to your health care provider about which screenings and vaccines you need and how often you need them. This information is not intended to replace advice given to you by your health care provider. Make sure you discuss any questions you have with your health care provider. Document Released: 10/10/2015 Document Revised: 06/02/2016 Document Reviewed: 07/15/2015 Elsevier Interactive Patient Education  2017 [place] Prevention in the Home Falls can cause injuries. They can happen to people of all ages. There are many things you can do to make your home safe and to help prevent falls. What can I do on the outside of my home? Regularly fix the edges of walkways and driveways and fix any cracks. Remove anything that might make you trip as you walk through a door, such as a raised step or threshold. Trim any bushes or trees on the path to your home. Use bright outdoor lighting. Clear any walking paths of anything that might make someone trip, such as rocks or tools. Regularly check to see if handrails are loose or broken. Make sure that both sides of any steps have handrails. Any raised decks and porches should have guardrails on the edges. Have any leaves, snow, or ice cleared regularly. Use sand or salt on walking paths during winter. Clean up any spills in your garage right away. This includes oil or grease spills. What can I do in the bathroom? Use night lights. Install grab bars by the toilet and in the tub and shower. Do not use towel bars as grab bars. Use non-skid mats or decals in the tub or shower. If you need to sit down in  the shower, use a plastic, non-slip stool. Keep the floor dry. Clean up any water that spills on the floor as soon as it happens. Remove soap buildup in the tub or shower regularly. Attach bath mats securely with double-sided non-slip rug tape. Do not have throw rugs and other things on the floor that can make you trip. What can I do in the bedroom? Use night lights. Make sure that you have a light by your bed that is easy to reach. Do not use any sheets or blankets that are too big for your bed. They should not hang down onto the floor. Have a firm chair that has side arms. You can use this for support while you get dressed. Do not have throw rugs and other things on the floor that can make you trip. What can I do in the kitchen? Clean up any spills right away. Avoid walking on wet floors. Keep items that you use a lot in easy-to-reach places. If you need to reach something above you, use a strong step stool that has a grab bar. Keep electrical cords out  of the way. Do not use floor polish or wax that makes floors slippery. If you must use wax, use non-skid floor wax. Do not have throw rugs and other things on the floor that can make you trip. What can I do with my stairs? Do not leave any items on the stairs. Make sure that there are handrails on both sides of the stairs and use them. Fix handrails that are broken or loose. Make sure that handrails are as long as the stairways. Check any carpeting to make sure that it is firmly attached to the stairs. Fix any carpet that is loose or worn. Avoid having throw rugs at the top or bottom of the stairs. If you do have throw rugs, attach them to the floor with carpet tape. Make sure that you have a light switch at the top of the stairs and the bottom of the stairs. If you do not have them, ask someone to add them for you. What else can I do to help prevent falls? Wear shoes that: Do not have high heels. Have rubber bottoms. Are comfortable  and fit you well. Are closed at the toe. Do not wear sandals. If you use a stepladder: Make sure that it is fully opened. Do not climb a closed stepladder. Make sure that both sides of the stepladder are locked into place. Ask someone to hold it for you, if possible. Clearly mark and make sure that you can see: Any grab bars or handrails. First and last steps. Where the edge of each step is. Use tools that help you move around (mobility aids) if they are needed. These include: Canes. Walkers. Scooters. Crutches. Turn on the lights when you go into a dark area. Replace any light bulbs as soon as they burn out. Set up your furniture so you have a clear path. Avoid moving your furniture around. If any of your floors are uneven, fix them. If there are any pets around you, be aware of where they are. Review your medicines with your doctor. Some medicines can make you feel dizzy. This can increase your chance of falling. Ask your doctor what other things that you can do to help prevent falls. This information is not intended to replace advice given to you by your health care provider. Make sure you discuss any questions you have with your health care provider. Document Released: 07/10/2009 Document Revised: 02/19/2016 Document Reviewed: 10/18/2014 Elsevier Interactive Patient Education  2017 [employer].

## 2022-12-10 NOTE — Progress Notes (Signed)
Subjective:   Natasha Norman is a 72 y.o. female who presents for Medicare Annual (Subsequent) preventive examination.  Review of Systems     Cardiac Risk Factors include: advanced age (>66men, >54 women)     Objective:    Today's Vitals   12/10/22 1048  BP: 118/62  Pulse: 78  Temp: 97.8 F (36.6 C)  TempSrc: Oral  SpO2: 94%  Weight: 127 lb 4.8 oz (57.7 kg)  Height: 5\' 5"  (1.651 m)   Body mass index is 21.18 kg/m.     12/10/2022   11:04 AM 12/07/2021   10:06 AM 02/18/2021    1:05 PM 11/11/2020    9:41 AM 01/24/2020   10:22 AM 09/24/2019    9:10 AM 02/02/2017    7:46 AM  Advanced Directives  Does Patient Have a Medical Advance Directive? Yes Yes Yes Yes Yes Yes Yes  Type of Paramedic of [place];Living will [place];Living will Living will Healthcare Power of [place];Living will Living will [place];Living will  Does patient want to make changes to medical advance directive? No - Patient declined No - Patient declined No - Patient declined  No - Patient declined No - Patient declined No - Patient declined  Copy of [place] in Chart? Yes - validated most recent copy scanned in chart (See row information)   Yes - validated most recent copy scanned in chart (See row information) Yes - validated most recent copy scanned in chart (See row information)  Yes    Current Medications (verified) Outpatient Encounter Medications as of 12/10/2022  Medication Sig   atorvastatin (LIPITOR) 10 MG tablet Take 1 tablet (10 mg total) by mouth daily.   calcium carbonate (OS-CAL) 600 MG TABS tablet Take 600 mg by mouth 2 (two) times daily with a meal.   levothyroxine (SYNTHROID) 50 MCG tablet Take 1 tablet (50 mcg total) by mouth daily before breakfast.   loratadine (CLARITIN) 10 MG tablet Take 10 mg by mouth daily.   Multiple Vitamin (MULTIVITAMIN) tablet Take 1 tablet by mouth  daily.   Multiple Vitamins-Minerals (PRESERVISION AREDS PO) Take by mouth.   VITAMIN D PO Take by mouth.   No facility-administered encounter medications on file as of 12/10/2022.    Allergies (verified) Patient has no known allergies.   History: Past Medical History:  Diagnosis Date   Cancer ([place]) 1996   Melanoma   Chicken pox    H/O repair of rotator cuff    Right    Hypothyroidism    Osteopenia    Seasonal allergies    Past Surgical History:  Procedure Laterality Date   BREAST BIOPSY Right 1984, 1996   Benign   lymph node resection  1998   Family History  Problem Relation Age of Onset   Cancer Mother        breast cancer   Breast cancer Mother    Heart disease Father    Diabetes Father    Heart failure Maternal Grandmother    Stroke Paternal Grandfather    Social History   Socioeconomic History   Marital status: Married    Spouse name: Not on file   Number of children: 3   Years of education: 4 years college   Highest education level: Bachelor's degree (e.g., BA, AB, BS)  Occupational History   Occupation: Retired  Tobacco Use   Smoking status: Never   Smokeless tobacco: Never  Substance and  Sexual Activity   Alcohol use: Yes    Alcohol/week: 1.0 standard drink of alcohol    Types: 1 Glasses of wine per week    Comment: Wine occasionally   Drug use: No   Sexual activity: Not on file  Other Topics Concern   Not on file  Social History Narrative   Retired [employer]   Married + dogs   Moved from [place]    3 children   Social Determinants of Health   Financial Resource Strain: [place]  (12/10/2022)   Overall Financial Resource Strain (CARDIA)    Difficulty of Paying Living Expenses: Not hard at all  Food Insecurity: No Food Insecurity (12/10/2022)   Hunger Vital Sign    Worried About Running Out of Food in the Last Year: Never true    [place] in the Last Year: Never true  Transportation Needs: No  Transportation Needs (12/10/2022)   PRAPARE - Hydrologist (Medical): No    Lack of Transportation (Non-Medical): No  Physical Activity: Sufficiently Active (12/10/2022)   Exercise Vital Sign    Days of Exercise per Week: 7 days    Minutes of Exercise per Session: 40 min  Stress: No Stress Concern Present (12/10/2022)   [place]    Feeling of Stress : Not at all  Social Connections: [place] (12/10/2022)   Social Connection and Isolation Panel [NHANES]    Frequency of Communication with Friends and Family: More than three times a week    Frequency of Social Gatherings with Friends and Family: More than three times a week    Attends Religious Services: More than 4 times per year    Active Member of [employer] or Organizations: Yes    Attends Music therapist: More than 4 times per year    Marital Status: Married    Tobacco Counseling Counseling given: Not Answered   Clinical Intake:  Pre-visit preparation completed: Yes  Pain : No/denies pain     BMI - recorded: 21.18 Nutritional Status: BMI of 19-24  Normal Nutritional Risks: None Diabetes: No  How often do you need to have someone help you when you read instructions, pamphlets, or other written materials from your doctor or pharmacy?: 1 - Never  Diabetic?  No  Interpreter Needed?: No  Information entered by :: Rolene Arbour LPN   Activities of Daily Living    12/10/2022   11:03 AM 12/06/2022    3:46 PM  In your present state of health, do you have any difficulty performing the following activities:  Hearing? 0 0  Vision? 0 0  Difficulty concentrating or making decisions? 0 0  Walking or climbing stairs? 0 0  Dressing or bathing? 0 0  Doing errands, shopping? 0 0  Preparing Food and eating ? N N  Using the Toilet? N N  In the past six months, have you accidently leaked urine? N N  Do you have  problems with loss of bowel control? N N  Managing your Medications? N N  Managing your Finances? N N  Housekeeping or managing your Housekeeping? N N    Patient Care Team: Dorothyann Peng, NP as PCP - General (Family Medicine)  Indicate any recent Medical Services you may have received from other than [facility] providers in the past year (date may be approximate).     Assessment:   This is a routine wellness examination for  Natasha Norman.  Hearing/Vision screen Hearing Screening - Comments:: Denies hearing difficulties   Vision Screening - Comments:: Wears rx glasses - up to date with routine eye exams with  Dr Prudencio Burly  Dietary issues and exercise activities discussed: Exercise limited by: None identified   Goals Addressed               This Visit's Progress     Stay Healthy (pt-stated)         Depression Screen    12/10/2022   10:50 AM 12/07/2021   10:02 AM 10/20/2021    7:27 AM 11/11/2020    9:39 AM 09/24/2019    9:20 AM 07/11/2018    2:10 PM 02/17/2018    8:14 AM  PHQ 2/9 Scores  PHQ - 2 Score 0 0 0 0 0 0 0    Fall Risk    12/10/2022   11:03 AM 12/06/2022    3:46 PM 10/17/2021    9:27 AM 11/11/2020    9:42 AM 09/24/2019    9:17 AM  Fall Risk   Falls in the past year? 0 0 0 0 0  Number falls in past yr: 0   0   Injury with Fall? 0   0   Risk for fall due to : No Fall Risks   Impaired vision   Follow up Falls prevention discussed   Falls prevention discussed     FALL RISK PREVENTION PERTAINING TO THE HOME:  Any stairs in or around the home? Yes  If so, are there any without handrails? No  Home free of loose throw rugs in walkways, pet beds, electrical cords, etc? Yes  Adequate lighting in your home to reduce risk of falls? Yes   ASSISTIVE DEVICES UTILIZED TO PREVENT FALLS:  Life alert? No  Use of a cane, walker or w/c? No  Grab bars in the bathroom? No  Shower chair or bench in shower? No  Elevated toilet seat or a handicapped toilet? No   TIMED UP AND  GO:  Was the test performed? Yes .  Length of time to ambulate 10 feet: 10 sec.   Gait steady and fast without use of assistive device  Cognitive Function:        12/10/2022   11:04 AM 12/07/2021   10:06 AM 11/11/2020    9:45 AM 09/24/2019    9:36 AM  6CIT Screen  What Year? 0 points 0 points 0 points 0 points  What month? 0 points 0 points 0 points 0 points  What time? 0 points 0 points  0 points  Count back from 20 0 points 0 points 0 points 0 points  Months in reverse 0 points 0 points 0 points 0 points  Repeat phrase 0 points 0 points 0 points 0 points  Total Score 0 points 0 points  0 points    Immunizations Immunization History  Administered Date(s) Administered   COVID-19, mRNA, vaccine(Comirnaty)12 years and older 06/28/2022   Fluad Quad(high Dose 65+) 06/28/2022   Influenza, High Dose Seasonal PF 06/12/2018   Influenza-Unspecified 07/08/2020, 07/01/2021   PFIZER(Purple Top)SARS-COV-2 Vaccination 11/04/2019, 11/28/2019, 07/31/2020, 02/10/2021   Pfizer Covid-19 Vaccine Bivalent Booster 68yrs & up 07/01/2021   Pneumococcal Conjugate-13 02/17/2018   Pneumococcal Polysaccharide-23 09/07/2019   Rsv, Bivalent, Protein Subunit Rsvpref,pf Evans Lance) 07/28/2022   Tdap 09/27/2009, 07/31/2020   Zoster Recombinat (Shingrix) 12/26/2019, 06/13/2020   Zoster, Live 09/27/2012    TDAP status: Up to date  Flu Vaccine status: Up to date  Pneumococcal  vaccine status: Up to date  Covid-19 vaccine status: Completed vaccines  Qualifies for Shingles Vaccine? Yes   Zostavax completed Yes   Shingrix Completed?: Yes  Screening Tests Health Maintenance  Topic Date Due   COVID-19 Vaccine (7 - 2023-24 season) 12/26/2022 (Originally 08/23/2022)   COLONOSCOPY (Pts 45-13yrs Insurance coverage will need to be confirmed)  12/10/2023 (Originally 09/27/2022)   Medicare Annual Wellness ([place])  12/10/2023   MAMMOGRAM  07/09/2024   DTaP/Tdap/Td (3 - Td or Tdap) 07/31/2030   Pneumonia Vaccine  16+ Years old  Completed   INFLUENZA VACCINE  Completed   DEXA SCAN  Completed   Hepatitis C Screening  Completed   Zoster Vaccines- Shingrix  Completed   HPV VACCINES  Aged Out    Health Maintenance  There are no preventive care reminders to display for this patient.   Colorectal cancer screening: Referral to GI placed Patient deferred. Pt aware the office will call re: appt.  Mammogram status: Completed 07/09/22. Repeat every year  Bone Density status: Completed 12/11/19. Results reflect: Bone density results: OSTEOPOROSIS. Repeat every   years.  Lung Cancer Screening: (Low Dose CT Chest recommended if Age 80-80 years, 30 pack-year currently smoking OR have quit w/in 15years.) does not qualify.    Additional Screening:  Hepatitis C Screening: does qualify; Completed 02/02/17  Vision Screening: Recommended annual ophthalmology exams for early detection of glaucoma and other disorders of the eye. Is the patient up to date with their annual eye exam?  Yes  Who is the provider or what is the name of the office in which the patient attends annual eye exams? Dr Prudencio Burly If pt is not established with a provider, would they like to be referred to a provider to establish care? No .   Dental Screening: Recommended annual dental exams for proper oral hygiene  Community Resource Referral / Chronic Care Management:  CRR required this visit?  No   CCM required this visit?  No      Plan:     I have personally reviewed and noted the following in the patient's chart:   Medical and social history Use of alcohol, tobacco or illicit drugs  Current medications and supplements including opioid prescriptions. Patient is not currently taking opioid prescriptions. Functional ability and status Nutritional status Physical activity Advanced directives List of other physicians Hospitalizations, surgeries, and ER visits in previous 12 months Vitals Screenings to include cognitive, depression,  and falls Referrals and appointments  In addition, I have reviewed and discussed with patient certain preventive protocols, quality metrics, and best practice recommendations. A written personalized care plan for preventive services as well as general preventive health recommendations were provided to patient.     Criselda Peaches, LPN   [id number]   Nurse Notes: None

## 2023-01-06 ENCOUNTER — Encounter: Payer: Self-pay | Admitting: Adult Health

## 2023-01-07 NOTE — Telephone Encounter (Signed)
Please advise 

## 2023-05-27 ENCOUNTER — Other Ambulatory Visit: Payer: Self-pay | Admitting: Adult Health

## 2023-05-27 DIAGNOSIS — Z0000 Encounter for general adult medical examination without abnormal findings: Secondary | ICD-10-CM

## 2023-06-03 ENCOUNTER — Encounter: Payer: Self-pay | Admitting: Adult Health

## 2023-06-09 ENCOUNTER — Other Ambulatory Visit: Payer: Self-pay | Admitting: Adult Health

## 2023-06-09 DIAGNOSIS — Z780 Asymptomatic menopausal state: Secondary | ICD-10-CM

## 2023-06-09 NOTE — Telephone Encounter (Signed)
Please advise 

## 2023-07-11 ENCOUNTER — Ambulatory Visit
Admission: RE | Admit: 2023-07-11 | Discharge: 2023-07-11 | Disposition: A | Discharge status: Home / Self Care | Payer: Medicare Other | Source: Ambulatory Visit | Attending: Adult Health | Admitting: Adult Health

## 2023-07-11 DIAGNOSIS — Z0000 Encounter for general adult medical examination without abnormal findings: Secondary | ICD-10-CM

## 2023-07-13 ENCOUNTER — Encounter: Payer: Self-pay | Admitting: Adult Health

## 2023-08-31 ENCOUNTER — Other Ambulatory Visit: Payer: Self-pay | Admitting: Adult Health

## 2023-09-21 ENCOUNTER — Other Ambulatory Visit: Payer: Self-pay | Admitting: Adult Health

## 2023-09-21 DIAGNOSIS — E039 Hypothyroidism, unspecified: Secondary | ICD-10-CM

## 2023-10-03 ENCOUNTER — Telehealth: Payer: Self-pay

## 2023-10-03 NOTE — Telephone Encounter (Signed)
 Copied from CRM [id number]. Topic: Clinical - Request for Lab/Test Order >> Oct 03, 2023 12:24 PM Graeme ORN wrote: Reason for CRM: Patient called to make appt for yearly visit for med check with fasting labs. Appt scheduled for 1/17. No orders for labs showing. Thank You

## 2023-10-04 NOTE — Telephone Encounter (Signed)
 Noted.

## 2023-10-14 ENCOUNTER — Encounter: Payer: Self-pay | Admitting: Adult Health

## 2023-10-14 ENCOUNTER — Ambulatory Visit (INDEPENDENT_AMBULATORY_CARE_PROVIDER_SITE_OTHER): Payer: Medicare Other | Admitting: Adult Health

## 2023-10-14 VITALS — BP 117/78 | HR 73 | Temp 98.0°F | Ht 65.0 in | Wt 126.0 lb

## 2023-10-14 DIAGNOSIS — Z1211 Encounter for screening for malignant neoplasm of colon: Secondary | ICD-10-CM

## 2023-10-14 DIAGNOSIS — E039 Hypothyroidism, unspecified: Secondary | ICD-10-CM | POA: Diagnosis not present

## 2023-10-14 DIAGNOSIS — C439 Malignant melanoma of skin, unspecified: Secondary | ICD-10-CM

## 2023-10-14 DIAGNOSIS — E7800 Pure hypercholesterolemia, unspecified: Secondary | ICD-10-CM | POA: Diagnosis not present

## 2023-10-14 DIAGNOSIS — E559 Vitamin D deficiency, unspecified: Secondary | ICD-10-CM

## 2023-10-14 LAB — COMPREHENSIVE METABOLIC PANEL
ALT: 20 U/L (ref 0–35)
AST: 29 U/L (ref 0–37)
Albumin: 4.5 g/dL (ref 3.5–5.2)
Alkaline Phosphatase: 69 U/L (ref 39–117)
BUN: 13 mg/dL (ref 6–23)
CO2: 28 meq/L (ref 19–32)
Calcium: 9.3 mg/dL (ref 8.4–10.5)
Chloride: 104 meq/L (ref 96–112)
Creatinine, Ser: 0.91 mg/dL (ref 0.40–1.20)
GFR: 63.65 mL/min (ref >60.00)
Glucose, Bld: 99 mg/dL (ref 70–99)
Potassium: 3.9 meq/L (ref 3.5–5.1)
Sodium: 142 meq/L (ref 135–145)
Total Bilirubin: 0.7 mg/dL (ref 0.2–1.2)
Total Protein: 7 g/dL (ref 6.0–8.3)

## 2023-10-14 LAB — CBC WITH DIFFERENTIAL/PLATELET
Basophils Absolute: 0.1 10*3/uL (ref 0.0–0.1)
Basophils Relative: 1.3 % (ref 0.0–3.0)
Eosinophils Absolute: 0.2 10*3/uL (ref 0.0–0.7)
Eosinophils Relative: 3.6 % (ref 0.0–5.0)
HCT: 40.8 % (ref 36.0–46.0)
Hemoglobin: 13.2 g/dL (ref 12.0–15.0)
Lymphocytes Relative: 31.4 % (ref 12.0–46.0)
Lymphs Abs: 1.7 10*3/uL (ref 0.7–4.0)
MCHC: 32.3 g/dL (ref 30.0–36.0)
MCV: 89.1 fL (ref 78.0–100.0)
Monocytes Absolute: 0.4 10*3/uL (ref 0.1–1.0)
Monocytes Relative: 8.3 % (ref 3.0–12.0)
Neutro Abs: 3 10*3/uL (ref 1.4–7.7)
Neutrophils Relative %: 55.4 % (ref 43.0–77.0)
Platelets: 280 10*3/uL (ref 150.0–400.0)
RBC: 4.58 Mil/uL (ref 3.87–5.11)
RDW: 17.7 % — ABNORMAL HIGH (ref 11.5–15.5)
WBC: 5.3 10*3/uL (ref 4.0–10.5)

## 2023-10-14 LAB — LIPID PANEL
Cholesterol: 161 mg/dL (ref 0–200)
HDL: 95.5 mg/dL (ref >39.00)
LDL Cholesterol: 49 mg/dL (ref 0–99)
NonHDL: 65.6
Total CHOL/HDL Ratio: 2
Triglycerides: 83 mg/dL (ref 0.0–149.0)
VLDL: 16.6 mg/dL (ref 0.0–40.0)

## 2023-10-14 LAB — VITAMIN D 25 HYDROXY (VIT D DEFICIENCY, FRACTURES): VITD: 48.3 ng/mL (ref 30.00–100.00)

## 2023-10-14 LAB — TSH: TSH: 2.17 u[IU]/mL (ref 0.35–5.50)

## 2023-10-14 NOTE — Patient Instructions (Signed)
It was great seeing you today   We will follow up with you regarding your lab work   Please let me know if you need anything   

## 2023-10-14 NOTE — Progress Notes (Signed)
Subjective:    Patient ID: Natasha Norman, female    DOB: 11-28-1950, 73 y.o.   MRN: [id number]  HPI Patient presents for yearly preventative medicine examination. She is a pleasant 73 year old female who  has a past medical history of Cancer (HCC) (1996), Chicken pox, H/O repair of rotator cuff, Hypothyroidism, Osteopenia, and Seasonal allergies.  She has a history of hypothyroidism for which she takes Synthroid 50 mcg daily.  She denies any symptoms of hypo or hyperthyroidism. Lab Results  Component Value Date   TSH 2.29 10/05/2022   Hyperlipidemia -managed with Lipitor 10 mg daily.  Lab Results  Component Value Date   CHOL 211 (H) 10/05/2022   HDL 109.30 10/05/2022   LDLCALC 86 10/05/2022   TRIG 79.0 10/05/2022   CHOLHDL 2 10/05/2022   History of melanoma-is seen at dermatology every 3 months.  Vitamin D Deficiency - She does take OTC Vitamin D  All immunizations and health maintenance protocols were reviewed with the patient and needed orders were placed.  Appropriate screening laboratory values were ordered for the patient including screening of hyperlipidemia, renal function and hepatic function.   Medication reconciliation,  past medical history, social history, problem list and allergies were reviewed in detail with the patient  Goals were established with regard to weight loss, exercise, and  diet in compliance with medications. She stays active and eats healthy.  Wt Readings from Last 3 Encounters:  10/14/23 126 lb (57.2 kg)  12/10/22 127 lb 4.8 oz (57.7 kg)  10/05/22 121 lb (54.9 kg)   She is scheduled for a bone density screen in May.   Her last Colonoscopy was in 2014 while living in [place].    Review of Systems  Constitutional: Negative.   HENT: Negative.    Eyes: Negative.   Respiratory: Negative.    Cardiovascular: Negative.   Gastrointestinal: Negative.   Endocrine: Negative.   Genitourinary: Negative.   Musculoskeletal: Negative.    Skin: Negative.   Allergic/Immunologic: Negative.   Neurological: Negative.   Hematological: Negative.   Psychiatric/Behavioral: Negative.     Past Medical History:  Diagnosis Date   Cancer (HCC) 1996   Melanoma   Chicken pox    H/O repair of rotator cuff    Right    Hypothyroidism    Osteopenia    Seasonal allergies     Social History   Socioeconomic History   Marital status: Married    Spouse name: Not on file   Number of children: 3   Years of education: 4 years college   Highest education level: Bachelor's degree (e.g., BA, AB, BS)  Occupational History   Occupation: Retired  Tobacco Use   Smoking status: Never   Smokeless tobacco: Never  Substance and Sexual Activity   Alcohol use: Yes    Alcohol/week: 1.0 standard drink of alcohol    Types: 1 Glasses of wine per week    Comment: Wine occasionally   Drug use: No   Sexual activity: Not on file  Other Topics Concern   Not on file  Social History Narrative   Retired IT consultant Health   Married + dogs   Moved from [place]    3 children   Social Drivers of Health   Financial Resource Strain: Low Risk  (10/13/2023)   Overall Financial Resource Strain (CARDIA)    Difficulty of Paying Living Expenses: Not hard at all  Food Insecurity: No Food Insecurity (10/13/2023)   Hunger Vital Sign  Worried About Programme researcher, broadcasting/film/video in the Last Year: Never true    Ran Out of Food in the Last Year: Never true  Transportation Needs: No Transportation Needs (10/13/2023)   PRAPARE - Administrator, Civil Service (Medical): No    Lack of Transportation (Non-Medical): No  Physical Activity: Sufficiently Active (10/13/2023)   Exercise Vital Sign    Days of Exercise per Week: 7 days    Minutes of Exercise per Session: 30 min  Stress: No Stress Concern Present (10/13/2023)   [employer] of Occupational Health - Occupational Stress Questionnaire    Feeling of Stress : Not at all  Social  Connections: Socially Integrated (10/13/2023)   Social Connection and Isolation Panel [NHANES]    Frequency of Communication with Friends and Family: More than three times a week    Frequency of Social Gatherings with Friends and Family: More than three times a week    Attends Religious Services: More than 4 times per year    Active Member of [employer] or Organizations: Yes    Attends Engineer, structural: More than 4 times per year    Marital Status: Married  Catering manager Violence: Not At Risk (12/10/2022)   Humiliation, Afraid, Rape, and Kick questionnaire    Fear of Current or Ex-Partner: No    Emotionally Abused: No    Physically Abused: No    Sexually Abused: No    Past Surgical History:  Procedure Laterality Date   BREAST BIOPSY Right 1984, 1996   Benign   lymph node resection  1998    Family History  Problem Relation Age of Onset   Cancer Mother        breast cancer   Breast cancer Mother    Heart disease Father    Diabetes Father    Heart failure Maternal Grandmother    Stroke Paternal Grandfather     No Known Allergies  Current Outpatient Medications on File Prior to Visit  Medication Sig Dispense Refill   atorvastatin (LIPITOR) 10 MG tablet TAKE ONE TABLET BY MOUTH ONE TIME DAILY 90 tablet 0   calcium carbonate (OS-CAL) 600 MG TABS tablet Take 600 mg by mouth 2 (two) times daily with a meal.     levothyroxine (SYNTHROID) 50 MCG tablet Take 1 tablet (50 mcg total) by mouth daily before breakfast. Pt needs an appt. for further refills 90 tablet 0   loratadine (CLARITIN) 10 MG tablet Take 10 mg by mouth daily.     Multiple Vitamin (MULTIVITAMIN) tablet Take 1 tablet by mouth daily.     Multiple Vitamins-Minerals (PRESERVISION AREDS PO) Take by mouth.     VITAMIN D PO Take by mouth.     No current facility-administered medications on file prior to visit.    BP 117/78   Pulse 73   Temp 98 F (36.7 C) (Oral)   Ht 5\' 5"  (1.651 m)   Wt 126 lb (57.2 kg)    SpO2 98%   BMI 20.97 kg/m       Objective:   Physical Exam Vitals and nursing note reviewed.  Constitutional:      General: She is not in acute distress.    Appearance: Normal appearance. She is not ill-appearing.  HENT:     Head: Normocephalic and atraumatic.     Right Ear: Tympanic membrane, ear canal and external ear normal. There is no impacted cerumen.     Left Ear: Tympanic membrane, ear canal and external ear  normal. There is no impacted cerumen.     Nose: Nose normal. No congestion or rhinorrhea.     Mouth/Throat:     Mouth: Mucous membranes are moist.     Pharynx: Oropharynx is clear.  Eyes:     Extraocular Movements: Extraocular movements intact.     Conjunctiva/sclera: Conjunctivae normal.     Pupils: Pupils are equal, round, and reactive to light.  Neck:     Vascular: No carotid bruit.  Cardiovascular:     Rate and Rhythm: Normal rate and regular rhythm.     Pulses: Normal pulses.     Heart sounds: No murmur heard.    No friction rub. No gallop.  Pulmonary:     Effort: Pulmonary effort is normal.     Breath sounds: Normal breath sounds.  Abdominal:     General: Abdomen is flat. Bowel sounds are normal. There is no distension.     Palpations: Abdomen is soft. There is no mass.     Tenderness: There is no abdominal tenderness. There is no guarding or rebound.     Hernia: No hernia is present.  Musculoskeletal:        General: Normal range of motion.     Cervical back: Normal range of motion and neck supple.  Lymphadenopathy:     Cervical: No cervical adenopathy.  Skin:    General: Skin is warm and dry.     Capillary Refill: Capillary refill takes less than 2 seconds.  Neurological:     General: No focal deficit present.     Mental Status: She is alert and oriented to person, place, and time.  Psychiatric:        Mood and Affect: Mood normal.        Behavior: Behavior normal.        Thought Content: Thought content normal.        Judgment: Judgment  normal.       Assessment & Plan:  1. Hypothyroidism, unspecified type (Primary) - Consider dose change of synthroid  - CBC with Differential/Platelet; Future - Comprehensive metabolic panel; Future - Lipid panel; Future - TSH; Future  2. Pure hypercholesterolemia -Consider dose change of statin.  - CBC with Differential/Platelet; Future - Comprehensive metabolic panel; Future - Lipid panel; Future - TSH; Future  3. Vitamin D deficiency  - VITAMIN D 25 Hydroxy (Vit-D Deficiency, Fractures); Future  4. Melanoma of skin (HCC) - Follow up with Dermatology as directed  5. Colon cancer screening  - Ambulatory referral to Gastroenterology  Shirline Frees, NP

## 2023-10-25 ENCOUNTER — Ambulatory Visit (AMBULATORY_SURGERY_CENTER): Payer: Medicare Other

## 2023-10-25 VITALS — Ht 65.0 in | Wt 126.0 lb

## 2023-10-25 DIAGNOSIS — Z1211 Encounter for screening for malignant neoplasm of colon: Secondary | ICD-10-CM

## 2023-10-25 MED ORDER — SUTAB 1479-225-188 MG PO TABS
12.0000 | ORAL_TABLET | ORAL | 0 refills | Status: DC
Start: 2023-10-25 — End: 2023-11-08

## 2023-10-25 NOTE — Progress Notes (Signed)
No egg or soy allergy known to patient  No issues known to pt with past sedation with any surgeries or procedures Patient denies ever being told they had issues or difficulty with intubation  No FH of Malignant Hyperthermia Pt is not on diet pills Pt is not on  home 02  Pt is not on blood thinners  Pt denies issues with constipation  No A fib or A flutter Have any cardiac testing pending-- no  LOA: independent  Prep: sutab  Patient's chart reviewed by Cathlyn Parsons CNRA prior to previsit and patient appropriate for the [facility].  Previsit completed and red dot placed by patient's name on their procedure day (on provider's schedule).     PV completed with patient. Prep instructions sent via mychart and home address.

## 2023-11-08 ENCOUNTER — Other Ambulatory Visit: Payer: Self-pay

## 2023-11-08 ENCOUNTER — Telehealth: Payer: Self-pay | Admitting: Pediatrics

## 2023-11-08 DIAGNOSIS — Z1211 Encounter for screening for malignant neoplasm of colon: Secondary | ICD-10-CM

## 2023-11-08 MED ORDER — SUTAB 1479-225-188 MG PO TABS: 12.0000 | ORAL_TABLET | ORAL | 0 refills | Status: DC

## 2023-11-08 NOTE — Telephone Encounter (Signed)
Rx sent pt made aware

## 2023-11-08 NOTE — Telephone Encounter (Signed)
Inbound call from patient, states she spoke to [facility] and they have no information for her prep medication. She states they requested to have medication resent.

## 2023-11-14 ENCOUNTER — Encounter: Payer: Medicare Other | Admitting: Pediatrics

## 2023-11-18 NOTE — Telephone Encounter (Signed)
Patient is called stating she is taking Suflave prep medication instead of the Sutab prep medication. Patient states [facility] is charging her $60 for the Sutab compared to the Suflave medication which is $15.

## 2023-11-18 NOTE — Telephone Encounter (Signed)
Spoke with pt. Pt is doing Golytely prep. [facility] has sent the medication as well as  the prep instructions.

## 2023-11-20 NOTE — Progress Notes (Unsigned)
 [facility] History and Physical   Primary Care Physician:  Shirline Frees, NP   Reason for Procedure:  Colon cancer screening  Plan:    Screening colonoscopy     HPI: Natasha Norman is a 73 y.o. female undergoing colonoscopy for colon cancer screening.  Records indicate last colonoscopy was performed in [place] 2014.  No history of colorectal cancer in a first-degree relative.  Record indicates there may be a family history of colorectal cancer in an aunt.  No current symptoms of hematochezia or change in bowel habits.   Past Medical History:  Diagnosis Date   Cancer (HCC) 1996   Melanoma   Chicken pox    H/O repair of rotator cuff    Right    Hypothyroidism    Osteopenia    Seasonal allergies     Past Surgical History:  Procedure Laterality Date   BREAST BIOPSY Right 1984, 1996   Benign   lymph node resection  1998    Prior to Admission medications   Medication Sig Start Date End Date Taking? Authorizing Provider  atorvastatin (LIPITOR) 10 MG tablet TAKE ONE TABLET BY MOUTH ONE TIME DAILY 08/31/23   Nafziger, Kandee Keen, NP  calcium carbonate (OS-CAL) 600 MG TABS tablet Take 600 mg by mouth 2 (two) times daily with a meal.    [provider]  levothyroxine (SYNTHROID) 50 MCG tablet Take 1 tablet (50 mcg total) by mouth daily before breakfast. Pt needs an appt. for further refills 09/22/23   Shirline Frees, NP  loratadine (CLARITIN) 10 MG tablet Take 10 mg by mouth daily as needed.    [provider]  Multiple Vitamin (MULTIVITAMIN) tablet Take 1 tablet by mouth daily.    [provider]  Multiple Vitamins-Minerals (PRESERVISION AREDS PO) Take by mouth.    [provider]  Sodium Sulfate-Mag Sulfate-KCl (SUTAB) [phone] MG TABS Take 12 tablets by mouth as directed. 11/08/23   Ottie Glazier, MD  VITAMIN D PO Take by mouth.    [provider]    Current Outpatient Medications  Medication Sig Dispense Refill    atorvastatin (LIPITOR) 10 MG tablet TAKE ONE TABLET BY MOUTH ONE TIME DAILY 90 tablet 0   calcium carbonate (OS-CAL) 600 MG TABS tablet Take 600 mg by mouth 2 (two) times daily with a meal.     levothyroxine (SYNTHROID) 50 MCG tablet Take 1 tablet (50 mcg total) by mouth daily before breakfast. Pt needs an appt. for further refills 90 tablet 0   loratadine (CLARITIN) 10 MG tablet Take 10 mg by mouth daily as needed.     Multiple Vitamin (MULTIVITAMIN) tablet Take 1 tablet by mouth daily.     Multiple Vitamins-Minerals (PRESERVISION AREDS PO) Take by mouth.     VITAMIN D PO Take by mouth.     Current Facility-Administered Medications  Medication Dose Route Frequency Provider Last Rate Last Admin   0.9 %  sodium chloride infusion  500 mL Intravenous Continuous Kathan Kirker, Durene Romans, MD        Allergies as of 11/21/2023   (No Known Allergies)    Family History  Problem Relation Age of Onset   Cancer Mother        breast cancer   Breast cancer Mother    Heart disease Father    Diabetes Father    Colon cancer Maternal Aunt        possibly not for sure   Heart failure Maternal Grandmother    Stroke Paternal  Grandfather    Colon polyps Neg Hx    Esophageal cancer Neg Hx    Rectal cancer Neg Hx    Stomach cancer Neg Hx     Social History   Socioeconomic History   Marital status: Married    Spouse name: Not on file   Number of children: 3   Years of education: 4 years college   Highest education level: Bachelor's degree (e.g., BA, AB, BS)  Occupational History   Occupation: Retired  Tobacco Use   Smoking status: Never   Smokeless tobacco: Never  Vaping Use   Vaping status: Never Used  Substance and Sexual Activity   Alcohol use: Yes    Alcohol/week: 1.0 standard drink of alcohol    Types: 1 Glasses of wine per week    Comment: Wine occasionally   Drug use: No   Sexual activity: Not on file  Other Topics Concern   Not on file  Social History Narrative   Retired  IT consultant Health   Married + dogs   Moved from [place]    3 children   Social Drivers of Health   Financial Resource Strain: Low Risk  (10/13/2023)   Overall Financial Resource Strain (CARDIA)    Difficulty of Paying Living Expenses: Not hard at all  Food Insecurity: No Food Insecurity (10/13/2023)   Hunger Vital Sign    Worried About Running Out of Food in the Last Year: Never true    Ran Out of Food in the Last Year: Never true  Transportation Needs: No Transportation Needs (10/13/2023)   PRAPARE - Administrator, Civil Service (Medical): No    Lack of Transportation (Non-Medical): No  Physical Activity: Sufficiently Active (10/13/2023)   Exercise Vital Sign    Days of Exercise per Week: 7 days    Minutes of Exercise per Session: 30 min  Stress: No Stress Concern Present (10/13/2023)   [employer] of Occupational Health - Occupational Stress Questionnaire    Feeling of Stress : Not at all  Social Connections: Socially Integrated (10/13/2023)   Social Connection and Isolation Panel [NHANES]    Frequency of Communication with Friends and Family: More than three times a week    Frequency of Social Gatherings with Friends and Family: More than three times a week    Attends Religious Services: More than 4 times per year    Active Member of [employer] or Organizations: Yes    Attends Engineer, structural: More than 4 times per year    Marital Status: Married  Catering manager Violence: Not At Risk (12/10/2022)   Humiliation, Afraid, Rape, and Kick questionnaire    Fear of Current or Ex-Partner: No    Emotionally Abused: No    Physically Abused: No    Sexually Abused: No    Review of Systems:  All other review of systems negative except as mentioned in the HPI.  Physical Exam: Vital signs BP 127/80   Pulse (!) 105   Temp 97.7 F (36.5 C) (Skin)   Ht 5\' 5"  (1.651 m)   Wt 126 lb (57.2 kg)   SpO2 100%   BMI 20.97 kg/m   General:    Alert,  Well-developed, well-nourished, pleasant and cooperative in NAD Airway:  Mallampati 2 Lungs:  Clear throughout to auscultation.   Heart:  Regular rate and rhythm; no murmurs, clicks, rubs,  or gallops. Abdomen:  Soft, nontender and nondistended. Normal bowel sounds.   Neuro/Psych:  Normal mood and  affect. A and O x 3  Maren Beach, MD [facility]

## 2023-11-21 ENCOUNTER — Ambulatory Visit (AMBULATORY_SURGERY_CENTER): Payer: Medicare Other | Admitting: Pediatrics

## 2023-11-21 ENCOUNTER — Encounter: Payer: Self-pay | Admitting: Pediatrics

## 2023-11-21 VITALS — BP 111/73 | HR 73 | Temp 97.7°F | Resp 16 | Ht 65.0 in | Wt 126.0 lb

## 2023-11-21 DIAGNOSIS — K5730 Diverticulosis of large intestine without perforation or abscess without bleeding: Secondary | ICD-10-CM

## 2023-11-21 DIAGNOSIS — D123 Benign neoplasm of transverse colon: Secondary | ICD-10-CM

## 2023-11-21 DIAGNOSIS — K648 Other hemorrhoids: Secondary | ICD-10-CM | POA: Diagnosis not present

## 2023-11-21 DIAGNOSIS — Z1211 Encounter for screening for malignant neoplasm of colon: Secondary | ICD-10-CM

## 2023-11-21 DIAGNOSIS — Q439 Congenital malformation of intestine, unspecified: Secondary | ICD-10-CM

## 2023-11-21 MED ORDER — SODIUM CHLORIDE 0.9 % IV SOLN: 500.0000 mL | INTRAVENOUS | Status: DC

## 2023-11-21 NOTE — Op Note (Signed)
 [facility] Patient Name: Natasha Norman Procedure Date: 11/21/2023 8:25 AM MRN: [id number] Endoscopist: Maren Beach , MD, [id number] Age: 73 Referring MD:  Date of Birth: Jul 17, 1951 Gender: Female Account #: [id number] Procedure:                Colonoscopy Indications:              Screening for colorectal malignant neoplasm, Last                            colonoscopy: 2014 Medicines:                Monitored Anesthesia Care Procedure:                Pre-Anesthesia Assessment:                           - Prior to the procedure, a History and Physical                            was performed, and patient medications and                            allergies were reviewed. The patient's tolerance of                            previous anesthesia was also reviewed. The risks                            and benefits of the procedure and the sedation                            options and risks were discussed with the patient.                            All questions were answered, and informed consent                            was obtained. Prior Anticoagulants: The patient has                            taken no anticoagulant or antiplatelet agents. ASA                            Grade Assessment: II - A patient with mild systemic                            disease. After reviewing the risks and benefits,                            the patient was deemed in satisfactory condition to                            undergo the procedure.  After obtaining informed consent, the colonoscope                            was passed under direct vision. Throughout the                            procedure, the patient's blood pressure, pulse, and                            oxygen saturations were monitored continuously. The                            Olympus Scope [id number] was introduced through the                            anus and advanced to the cecum,  identified by                            appendiceal orifice and ileocecal valve. The                            colonoscopy was performed without difficulty. The                            patient tolerated the procedure well. The quality                            of the bowel preparation was good. The ileocecal                            valve, appendiceal orifice, and rectum were                            photographed. Scope In: 8:34:00 AM Scope Out: 8:55:48 AM Scope Withdrawal Time: 0 hours 10 minutes 27 seconds  Total Procedure Duration: 0 hours 21 minutes 48 seconds  Findings:                 The perianal and digital rectal examinations were                            normal. Pertinent negatives include normal                            sphincter tone and no palpable rectal lesions.                           A few small-mouthed diverticula were found in the                            sigmoid colon and descending colon.                           The left colon was moderately tortuous. Advancing  the scope required applying abdominal pressure.                           Two sessile polyps were found in the transverse                            colon. The polyps were 3 to 5 mm in size. These                            polyps were removed with a cold snare. Resection                            and retrieval were complete.                           Internal hemorrhoids were found during retroflexion. Complications:            No immediate complications. Estimated blood loss:                            Minimal. Estimated Blood Loss:     Estimated blood loss was minimal. Impression:               - Diverticulosis in the sigmoid colon and in the                            descending colon.                           - Tortuous colon.                           - Two 3 to 5 mm polyps in the transverse colon,                            removed with a cold snare.  Resected and retrieved.                           - Internal hemorrhoids. Recommendation:           - Discharge patient to home (ambulatory).                           - Await pathology results.                           - Repeat colonoscopy for surveillance based on                            pathology results.                           - The findings and recommendations were discussed                            with the patient's family.                           -  Return to referring physician.                           - Patient has a contact number available for                            emergencies. The signs and symptoms of potential                            delayed complications were discussed with the                            patient. Return to normal activities tomorrow.                            Written discharge instructions were provided to the                            patient. Maren Beach, MD 11/21/2023 9:00:11 AM This report has been signed electronically.

## 2023-11-21 NOTE — Progress Notes (Signed)
 Called to room to assist during endoscopic procedure.  Patient ID and intended procedure confirmed with present staff. Received instructions for my participation in the procedure from the performing physician.

## 2023-11-21 NOTE — Progress Notes (Signed)
 Pt A/O x 3, gd SR's, pleased with anesthesia, report to RN

## 2023-11-21 NOTE — Patient Instructions (Signed)
 Resume previous diet and medications.  Handouts provided on polyps and diverticulosis.  Follow up colonoscopy based on biopsy results.    YOU HAD AN ENDOSCOPIC PROCEDURE TODAY AT THE [facility]:   Refer to the procedure report that was given to you for any specific questions about what was found during the examination.  If the procedure report does not answer your questions, please call your gastroenterologist to clarify.  If you requested that your care partner not be given the details of your procedure findings, then the procedure report has been included in a sealed envelope for you to review at your convenience later.  YOU SHOULD EXPECT: Some feelings of bloating in the abdomen. Passage of more gas than usual.  Walking can help get rid of the air that was put into your GI tract during the procedure and reduce the bloating. If you had a lower endoscopy (such as a colonoscopy or flexible sigmoidoscopy) you may notice spotting of blood in your stool or on the toilet paper. If you underwent a bowel prep for your procedure, you may not have a normal bowel movement for a few days.  Please Note:  You might notice some irritation and congestion in your nose or some drainage.  This is from the oxygen used during your procedure.  There is no need for concern and it should clear up in a day or so.  SYMPTOMS TO REPORT IMMEDIATELY:  Following lower endoscopy (colonoscopy or flexible sigmoidoscopy):  Excessive amounts of blood in the stool  Significant tenderness or worsening of abdominal pains  Swelling of the abdomen that is new, acute  Fever of 100F or higher  For urgent or emergent issues, a gastroenterologist can be reached at any hour by calling [phone]. Do not use MyChart messaging for urgent concerns.    DIET:  We do recommend a small meal at first, but then you may proceed to your regular diet.  Drink plenty of fluids but you should avoid alcoholic beverages for 24  hours.  ACTIVITY:  You should plan to take it easy for the rest of today and you should NOT DRIVE or use heavy machinery until tomorrow (because of the sedation medicines used during the test).    FOLLOW UP: Our staff will call the number listed on your records the next business day following your procedure.  We will call around 7:15- 8:00 am to check on you and address any questions or concerns that you may have regarding the information given to you following your procedure. If we do not reach you, we will leave a message.     If any biopsies were taken you will be contacted by phone or by letter within the next 1-3 weeks.  Please call us at [phone] if you have not heard about the biopsies in 3 weeks.    SIGNATURES/CONFIDENTIALITY: You and/or your care partner have signed paperwork which will be entered into your electronic medical record.  These signatures attest to the fact that that the information above on your After Visit Summary has been reviewed and is understood.  Full responsibility of the confidentiality of this discharge information lies with you and/or your care-partner.

## 2023-11-21 NOTE — Progress Notes (Signed)
 Pt's states no medical or surgical changes since previsit or office visit.

## 2023-11-22 ENCOUNTER — Telehealth: Payer: Self-pay

## 2023-11-22 NOTE — Telephone Encounter (Signed)
  Follow up Call-     11/21/2023    7:38 AM  Call back number  Post procedure Call Back phone  # [phone]  Permission to leave phone message Yes     Patient questions:  Do you have a fever, pain , or abdominal swelling? No. Pain Score  0 *  Have you tolerated food without any problems? Yes.    Have you been able to return to your normal activities? Yes.    Do you have any questions about your discharge instructions: Diet   No. Medications  No. Follow up visit  No.  Do you have questions or concerns about your Care? No.  Actions: * If pain score is 4 or above: No action needed, pain <4.

## 2023-11-23 ENCOUNTER — Encounter: Payer: Self-pay | Admitting: Pediatrics

## 2023-11-23 LAB — SURGICAL PATHOLOGY

## 2023-11-30 ENCOUNTER — Other Ambulatory Visit: Payer: Self-pay | Admitting: Adult Health

## 2023-12-19 ENCOUNTER — Other Ambulatory Visit: Payer: Medicare Other

## 2023-12-23 ENCOUNTER — Other Ambulatory Visit: Payer: Self-pay | Admitting: Adult Health

## 2023-12-23 DIAGNOSIS — E039 Hypothyroidism, unspecified: Secondary | ICD-10-CM

## 2024-02-01 ENCOUNTER — Ambulatory Visit: Payer: Medicare Other

## 2024-02-01 VITALS — BP 120/62 | HR 90 | Temp 98.5°F | Ht 65.0 in | Wt 132.3 lb

## 2024-02-01 DIAGNOSIS — Z0000 Encounter for general adult medical examination without abnormal findings: Secondary | ICD-10-CM

## 2024-02-01 NOTE — Progress Notes (Signed)
 Subjective:   Adreona Devary is a 73 y.o. who presents for a Medicare Wellness preventive visit.  Visit Complete: In person    Persons Participating in Visit: Patient.  AWV Questionnaire: Yes: Patient Medicare AWV questionnaire was completed by the patient on 01/30/24; I have confirmed that all information answered by patient is correct and no changes since this date.  Cardiac Risk Factors include: advanced age (>13men, >69 women)     Objective:    Today's Vitals   02/01/24 1344  BP: 120/62  Pulse: 90  Temp: 98.5 F (36.9 C)  TempSrc: Oral  SpO2: 99%  Weight: 132 lb 4.8 oz (60 kg)  Height: 5\' 5"  (1.651 m)   Body mass index is 22.02 kg/m.     02/01/2024    1:59 PM 12/10/2022   11:04 AM 12/07/2021   10:06 AM 02/18/2021    1:05 PM 11/11/2020    9:41 AM 01/24/2020   10:22 AM 09/24/2019    9:10 AM  Advanced Directives  Does Patient Have a Medical Advance Directive? Yes Yes Yes Yes Yes Yes Yes  Type of Estate agent of [place];Living will Healthcare Power of [place];Living will Healthcare Power of [place];Living will Living will Healthcare Power of [employer] Power of [place];Living will Living will  Does patient want to make changes to medical advance directive? No - Patient declined No - Patient declined No - Patient declined No - Patient declined  No - Patient declined No - Patient declined  Copy of Healthcare Power of Attorney in Chart? Yes - validated most recent copy scanned in chart (See row information) Yes - validated most recent copy scanned in chart (See row information)   Yes - validated most recent copy scanned in chart (See row information) Yes - validated most recent copy scanned in chart (See row information)     Current Medications (verified) Outpatient Encounter Medications as of 02/01/2024  Medication Sig   atorvastatin  (LIPITOR) 10 MG tablet TAKE ONE TABLET BY MOUTH ONE TIME DAILY   calcium  carbonate (OS-CAL) 600 MG TABS  tablet Take 600 mg by mouth 2 (two) times daily with a meal.   levothyroxine  (SYNTHROID ) 50 MCG tablet Take 1 tablet (50 mcg total) by mouth daily before breakfast.   loratadine (CLARITIN) 10 MG tablet Take 10 mg by mouth daily as needed.   Multiple Vitamin (MULTIVITAMIN) tablet Take 1 tablet by mouth daily.   Multiple Vitamins-Minerals (PRESERVISION AREDS PO) Take by mouth.   VITAMIN D  PO Take by mouth.   No facility-administered encounter medications on file as of 02/01/2024.    Allergies (verified) Patient has no known allergies.   History: Past Medical History:  Diagnosis Date   Cancer (HCC) 1996   Melanoma   Chicken pox    H/O repair of rotator cuff    Right    Hypothyroidism    Osteopenia    Seasonal allergies    Past Surgical History:  Procedure Laterality Date   BREAST BIOPSY Right 1984, 1996   Benign   lymph node resection  1998   Family History  Problem Relation Age of Onset   Cancer Mother        breast cancer   Breast cancer Mother    Heart disease Father    Diabetes Father    Colon cancer Maternal Aunt        possibly not for sure   Heart failure Maternal Grandmother    Stroke Paternal Grandfather    Colon  polyps Neg Hx    Esophageal cancer Neg Hx    Rectal cancer Neg Hx    Stomach cancer Neg Hx    Social History   Socioeconomic History   Marital status: Married    Spouse name: Not on file   Number of children: 3   Years of education: 4 years college   Highest education level: Bachelor's degree (e.g., BA, AB, BS)  Occupational History   Occupation: Retired  Tobacco Use   Smoking status: Never   Smokeless tobacco: Never  Vaping Use   Vaping status: Never Used  Substance and Sexual Activity   Alcohol use: Yes    Alcohol/week: 1.0 standard drink of alcohol    Types: 1 Glasses of wine per week    Comment: Wine occasionally   Drug use: No   Sexual activity: Not on file  Other Topics Concern   Not on file  Social History Narrative   Retired  IT consultant Health   Married + dogs   Moved from [place]     3 children   Social Drivers of Corporate investment banker Strain: Low Risk  (02/01/2024)   Overall Financial Resource Strain (CARDIA)    Difficulty of Paying Living Expenses: Not hard at all  Food Insecurity: No Food Insecurity (02/01/2024)   Hunger Vital Sign    Worried About Running Out of Food in the Last Year: Never true    Ran Out of Food in the Last Year: Never true  Transportation Needs: No Transportation Needs (02/01/2024)   PRAPARE - Administrator, Civil Service (Medical): No    Lack of Transportation (Non-Medical): No  Physical Activity: Sufficiently Active (02/01/2024)   Exercise Vital Sign    Days of Exercise per Week: 7 days    Minutes of Exercise per Session: 30 min  Stress: No Stress Concern Present (02/01/2024)   [employer] of Occupational Health - Occupational Stress Questionnaire    Feeling of Stress : Not at all  Social Connections: Socially Integrated (02/01/2024)   Social Connection and Isolation Panel [NHANES]    Frequency of Communication with Friends and Family: More than three times a week    Frequency of Social Gatherings with Friends and Family: More than three times a week    Attends Religious Services: More than 4 times per year    Active Member of [employer] or Organizations: Yes    Attends Engineer, structural: More than 4 times per year    Marital Status: Married    Tobacco Counseling Counseling given: Not Answered    Clinical Intake:  Pre-visit preparation completed: Yes  Pain : No/denies pain     BMI - recorded: 22.02 Nutritional Status: BMI of 19-24  Normal Nutritional Risks: None Diabetes: No  Lab Results  Component Value Date   HGBA1C 5.7 02/12/2016     How often do you need to have someone help you when you read instructions, pamphlets, or other written materials from your doctor or pharmacy?: 1 - Never  Interpreter Needed?:  No  Information entered by :: Farris Hong LPN   Activities of Daily Living     02/01/2024    1:58 PM 01/30/2024    1:17 PM  In your present state of health, do you have any difficulty performing the following activities:  Hearing? 0 0  Vision? 0 0  Difficulty concentrating or making decisions? 0 0  Walking or climbing stairs? 0 0  Dressing or bathing? 0 0  Doing errands, shopping? 0 0  Preparing Food and eating ? N N  Using the Toilet? N N  In the past six months, have you accidently leaked urine? N N  Do you have problems with loss of bowel control? N N  Managing your Medications? N N  Managing your Finances? N N  Housekeeping or managing your Housekeeping? N N    Patient Care Team: Alto Atta, NP as PCP - General (Family Medicine)  Indicate any recent Medical Services you may have received from other than [facility] providers in the past year (date may be approximate).     Assessment:   This is a routine wellness examination for [place].  Hearing/Vision screen Hearing Screening - Comments:: Denies hearing difficulties   Vision Screening - Comments:: Wears rx glasses - up to date with routine eye exams with  Dr Terrall Ferraris   Goals Addressed               This Visit's Progress     Remain active (pt-stated)         Depression Screen     02/01/2024    1:46 PM 12/10/2022   10:50 AM 12/07/2021   10:02 AM 10/20/2021    7:27 AM 11/11/2020    9:39 AM 09/24/2019    9:20 AM 07/11/2018    2:10 PM  PHQ 2/9 Scores  PHQ - 2 Score 0 0 0 0 0 0 0    Fall Risk     02/01/2024    1:58 PM 01/30/2024    1:17 PM 12/10/2022   11:03 AM 12/06/2022    3:46 PM 10/17/2021    9:27 AM  Fall Risk   Falls in the past year? 0 0 0 0 0  Number falls in past yr: 0  0    Injury with Fall? 0  0    Risk for fall due to : No Fall Risks  No Fall Risks    Follow up Falls prevention discussed;Falls evaluation completed  Falls prevention discussed      MEDICARE RISK AT HOME:  Medicare Risk at Home Any  stairs in or around the home?: Yes If so, are there any without handrails?: No Home free of loose throw rugs in walkways, pet beds, electrical cords, etc?: Yes Adequate lighting in your home to reduce risk of falls?: Yes Life alert?: No Use of a cane, walker or w/c?: No Grab bars in the bathroom?: No Shower chair or bench in shower?: No Elevated toilet seat or a handicapped toilet?: No  TIMED UP AND GO:  Was the test performed?  Yes Gait steady fast without use of device  Cognitive Function: 6CIT completed        02/01/2024    1:59 PM 12/10/2022   11:04 AM 12/07/2021   10:06 AM 11/11/2020    9:45 AM 09/24/2019    9:36 AM  6CIT Screen  What Year? 0 points 0 points 0 points 0 points 0 points  What month? 0 points 0 points 0 points 0 points 0 points  What time? 0 points 0 points 0 points  0 points  Count back from 20 0 points 0 points 0 points 0 points 0 points  Months in reverse 0 points 0 points 0 points 0 points 0 points  Repeat phrase 0 points 0 points 0 points 0 points 0 points  Total Score 0 points 0 points 0 points  0 points    Immunizations Immunization History  Administered Date(s) Administered  Fluad Quad(high Dose 65+) 06/28/2022   Influenza, High Dose Seasonal PF 06/12/2018   Influenza-Unspecified 07/08/2020, 07/01/2021, 06/28/2023   PFIZER(Purple Top)SARS-COV-2 Vaccination 11/04/2019, 11/28/2019, 07/31/2020, 02/10/2021   Pfizer Covid-19 Vaccine Bivalent Booster 23yrs & up 07/01/2021   Pfizer(Comirnaty)Fall Seasonal Vaccine 12 years and older 06/28/2022, 12/10/2022, 06/28/2023   Pneumococcal Conjugate-13 02/17/2018   Pneumococcal Polysaccharide-23 09/07/2019   Rsv, Bivalent, Protein Subunit Rsvpref,pf Pattricia Bores) 07/28/2022   Tdap 09/27/2009, 07/31/2020   Zoster Recombinant(Shingrix) 12/26/2019, 06/13/2020   Zoster, Live 09/27/2012    Screening Tests Health Maintenance  Topic Date Due   COVID-19 Vaccine (9 - Pfizer risk 2024-25 season) 12/27/2023   INFLUENZA  VACCINE  04/27/2024   Medicare Annual Wellness (AWV)  01/31/2025   MAMMOGRAM  07/10/2025   Colonoscopy  11/20/2028   DTaP/Tdap/Td (3 - Td or Tdap) 07/31/2030   Pneumonia Vaccine 74+ Years old  Completed   DEXA SCAN  Completed   Hepatitis C Screening  Completed   Zoster Vaccines- Shingrix  Completed   HPV VACCINES  Aged Out   Meningococcal B Vaccine  Aged Out    Health Maintenance  Health Maintenance Due  Topic Date Due   COVID-19 Vaccine (9 - Pfizer risk 2024-25 season) 12/27/2023   Health Maintenance Items Addressed:   Additional Screening:  Vision Screening: Recommended annual ophthalmology exams for early detection of glaucoma and other disorders of the eye.  Dental Screening: Recommended annual dental exams for proper oral hygiene  Community Resource Referral / Chronic Care Management: CRR required this visit?  No   CCM required this visit?  No     Plan:     I have personally reviewed and noted the following in the patient's chart:   Medical and social history Use of alcohol, tobacco or illicit drugs  Current medications and supplements including opioid prescriptions. Patient is not currently taking opioid prescriptions. Functional ability and status Nutritional status Physical activity Advanced directives List of other physicians Hospitalizations, surgeries, and ER visits in previous 12 months Vitals Screenings to include cognitive, depression, and falls Referrals and appointments  In addition, I have reviewed and discussed with patient certain preventive protocols, quality metrics, and best practice recommendations. A written personalized care plan for preventive services as well as general preventive health recommendations were provided to patient.     Dewayne Ford, LPN   10/02/1094   After Visit Summary: (In Person-Printed) AVS printed and given to the patient  Notes: Nothing significant to report at this time.

## 2024-02-01 NOTE — Patient Instructions (Addendum)
 Ms. Tomey , Thank you for taking time to come for your Medicare Wellness Visit. I appreciate your ongoing commitment to your health goals. Please review the following plan we discussed and let me know if I can assist you in the future.   Referrals/Orders/Follow-Ups/Clinician Recommendations:   This is a list of the screening recommended for you and due dates:  Health Maintenance  Topic Date Due   COVID-19 Vaccine (9 - Pfizer risk 2024-25 season) 12/27/2023   Flu Shot  04/27/2024   Medicare Annual Wellness Visit  01/31/2025   Mammogram  07/10/2025   Colon Cancer Screening  11/20/2028   DTaP/Tdap/Td vaccine (3 - Td or Tdap) 07/31/2030   Pneumonia Vaccine  Completed   DEXA scan (bone density measurement)  Completed   Hepatitis C Screening  Completed   Zoster (Shingles) Vaccine  Completed   HPV Vaccine  Aged Out   Meningitis B Vaccine  Aged Out    Advanced directives: (In Chart) A copy of your advanced directives are scanned into your chart should your provider ever need it.  Next Medicare Annual Wellness Visit scheduled for next year: Yes

## 2024-02-17 ENCOUNTER — Other Ambulatory Visit: Payer: Medicare Other

## 2024-02-28 ENCOUNTER — Other Ambulatory Visit: Payer: Self-pay | Admitting: Adult Health

## 2024-03-25 ENCOUNTER — Other Ambulatory Visit: Payer: Self-pay | Admitting: Adult Health

## 2024-03-25 DIAGNOSIS — E039 Hypothyroidism, unspecified: Secondary | ICD-10-CM

## 2024-05-24 ENCOUNTER — Ambulatory Visit (HOSPITAL_BASED_OUTPATIENT_CLINIC_OR_DEPARTMENT_OTHER)
Admission: RE | Admit: 2024-05-24 | Discharge: 2024-05-24 | Disposition: A | Discharge status: Home / Self Care | Source: Ambulatory Visit | Attending: Adult Health | Admitting: Adult Health

## 2024-05-24 ENCOUNTER — Other Ambulatory Visit

## 2024-05-24 DIAGNOSIS — Z780 Asymptomatic menopausal state: Secondary | ICD-10-CM | POA: Insufficient documentation

## 2024-05-26 ENCOUNTER — Other Ambulatory Visit: Payer: Self-pay | Admitting: Adult Health

## 2024-05-29 ENCOUNTER — Ambulatory Visit: Payer: Self-pay | Admitting: Adult Health

## 2024-06-08 ENCOUNTER — Encounter: Payer: Self-pay | Admitting: Adult Health

## 2024-06-08 MED ORDER — COVID-19 MRNA VACC (MODERNA) 50 MCG/0.5ML IM SUSP: 0.5000 mL | Freq: Once | INTRAMUSCULAR | 0 refills | Status: AC

## 2024-06-12 ENCOUNTER — Other Ambulatory Visit: Payer: Self-pay | Admitting: Adult Health

## 2024-06-12 DIAGNOSIS — Z0000 Encounter for general adult medical examination without abnormal findings: Secondary | ICD-10-CM

## 2024-06-21 ENCOUNTER — Other Ambulatory Visit: Payer: Self-pay | Admitting: Adult Health

## 2024-06-21 DIAGNOSIS — E039 Hypothyroidism, unspecified: Secondary | ICD-10-CM

## 2024-06-22 ENCOUNTER — Encounter: Payer: Self-pay | Admitting: Adult Health

## 2024-06-22 DIAGNOSIS — E039 Hypothyroidism, unspecified: Secondary | ICD-10-CM

## 2024-06-26 MED ORDER — ATORVASTATIN CALCIUM 10 MG PO TABS: 10.0000 mg | ORAL_TABLET | Freq: Every day | ORAL | 3 refills | Status: AC

## 2024-06-26 MED ORDER — LEVOTHYROXINE SODIUM 50 MCG PO TABS: 50.0000 ug | ORAL_TABLET | Freq: Every day | ORAL | 0 refills | Status: AC

## 2024-07-13 ENCOUNTER — Other Ambulatory Visit: Payer: Self-pay | Admitting: Medical Genetics

## 2024-07-19 ENCOUNTER — Ambulatory Visit
Admission: RE | Admit: 2024-07-19 | Discharge: 2024-07-19 | Disposition: A | Discharge status: Home / Self Care | Source: Ambulatory Visit | Attending: Adult Health | Admitting: Adult Health

## 2024-07-19 DIAGNOSIS — Z0000 Encounter for general adult medical examination without abnormal findings: Secondary | ICD-10-CM

## 2024-08-06 ENCOUNTER — Other Ambulatory Visit

## 2024-08-06 DIAGNOSIS — Z006 Encounter for examination for normal comparison and control in clinical research program: Secondary | ICD-10-CM

## 2024-08-20 LAB — GENECONNECT MOLECULAR SCREEN: Genetic Analysis Overall Interpretation: NEGATIVE

## 2025-02-06 ENCOUNTER — Ambulatory Visit
